# Patient Record
Sex: Female | Born: 1990
Health system: Southern US, Community
[De-identification: ages and names within clinical notes are randomized; demographics above are authoritative.]

## PROBLEM LIST (undated history)

## (undated) DIAGNOSIS — I10 Essential (primary) hypertension: Secondary | ICD-10-CM

## (undated) DIAGNOSIS — J45909 Unspecified asthma, uncomplicated: Secondary | ICD-10-CM

## (undated) DIAGNOSIS — R519 Headache, unspecified: Secondary | ICD-10-CM

## (undated) HISTORY — DX: Headache, unspecified: R51.9

## (undated) HISTORY — DX: Unspecified asthma, uncomplicated: J45.909

---
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
## 2013-10-07 ENCOUNTER — Encounter (HOSPITAL_COMMUNITY): Payer: Self-pay | Admitting: Emergency Medicine

## 2013-10-07 ENCOUNTER — Emergency Department (INDEPENDENT_AMBULATORY_CARE_PROVIDER_SITE_OTHER)
Admission: EM | Admit: 2013-10-07 | Discharge: 2013-10-07 | Disposition: A | Discharge status: Home / Self Care | Payer: Self-pay | Source: Home / Self Care | Attending: Family Medicine | Admitting: Family Medicine

## 2013-10-07 ENCOUNTER — Emergency Department (INDEPENDENT_AMBULATORY_CARE_PROVIDER_SITE_OTHER): Payer: Self-pay

## 2013-10-07 DIAGNOSIS — S93409A Sprain of unspecified ligament of unspecified ankle, initial encounter: Secondary | ICD-10-CM

## 2013-10-07 MED ORDER — DICLOFENAC SODIUM 50 MG PO TBEC: 50.0000 mg | DELAYED_RELEASE_TABLET | Freq: Two times a day (BID) | ORAL | Status: DC | PRN

## 2013-10-07 NOTE — Discharge Instructions (Signed)
Thank you for coming in today. Take diclofenac as needed Use the ankle brace when active  Acute Ankle Sprain with Phase I Rehab An acute ankle sprain is a partial or complete tear in one or more of the ligaments of the ankle due to traumatic injury. The severity of the injury depends on both the the number of ligaments sprained and the grade of sprain. There are 3 grades of sprains.   A grade 1 sprain is a mild sprain. There is a slight pull without obvious tearing. There is no loss of strength, and the muscle and ligament are the correct length.  A grade 2 sprain is a moderate sprain. There is tearing of fibers within the substance of the ligament where it connects two bones or two cartilages. The length of the ligament is increased, and there is usually decreased strength.  A grade 3 sprain is a complete rupture of the ligament and is uncommon. In addition to the grade of sprain, there are three types of ankle sprains.  Lateral ankle sprains: This is a sprain of one or more of the three ligaments on the outer side (lateral) of the ankle. These are the most common sprains. Medial ankle sprains: There is one large triangular ligament of the inner side (medial) of the ankle that is susceptible to injury. Medial ankle sprains are less common. Syndesmosis, "high ankle," sprains: The syndesmosis is the ligament that connects the two bones of the lower leg. Syndesmosis sprains usually only occur with very severe ankle sprains. SYMPTOMS  Pain, tenderness, and swelling in the ankle, starting at the side of injury that may progress to the whole ankle and foot with time.  "Pop" or tearing sensation at the time of injury.  Bruising that may spread to the heel.  Impaired ability to walk soon after injury. CAUSES   Acute ankle sprains are caused by trauma placed on the ankle that temporarily forces or pries the anklebone (talus) out of its normal socket.  Stretching or tearing of the ligaments that  normally hold the joint in place (usually due to a twisting injury). RISK INCREASES WITH:  Previous ankle sprain.  Sports in which the foot may land awkwardly (ie. basketball, volleyball, or soccer) or walking or running on uneven or rough surfaces.  Shoes with inadequate support to prevent sideways motion when stress occurs.  Poor strength and flexibility.  Poor balance skills.  Contact sports. PREVENTION   Warm up and stretch properly before activity.  Maintain physical fitness:  Ankle and leg flexibility, muscle strength, and endurance.  Cardiovascular fitness.  Balance training activities.  Use proper technique and have a coach correct improper technique.  Taping, protective strapping, bracing, or high-top tennis shoes may help prevent injury. Initially, tape is best; however, it loses most of its support function within 10 to 15 minutes.  Wear proper fitted protective shoes (High-top shoes with taping or bracing is more effective than either alone).  Provide the ankle with support during sports and practice activities for 12 months following injury. PROGNOSIS   If treated properly, ankle sprains can be expected to recover completely; however, the length of recovery depends on the degree of injury.  A grade 1 sprain usually heals enough in 5 to 7 days to allow modified activity and requires an average of 6 weeks to heal completely.  A grade 2 sprain requires 6 to 10 weeks to heal completely.  A grade 3 sprain requires 12 to 16 weeks to heal.  A  syndesmosis sprain often takes more than 3 months to heal. RELATED COMPLICATIONS   Frequent recurrence of symptoms may result in a chronic problem. Appropriately addressing the problem the first time decreases the frequency of recurrence and optimizes healing time. Severity of the initial sprain does not predict the likelihood of later instability.  Injury to other structures (bone, cartilage, or tendon).  A chronically  unstable or arthritic ankle joint is a possiblity with repeated sprains. TREATMENT Treatment initially involves the use of ice, medication, and compression bandages to help reduce pain and inflammation. Ankle sprains are usually immobilized in a walking cast or boot to allow for healing. Crutches may be recommended to reduce pressure on the injury. After immobilization, strengthening and stretching exercises may be necessary to regain strength and a full range of motion. Surgery is rarely needed to treat ankle sprains. MEDICATION   Nonsteroidal anti-inflammatory medications, such as aspirin and ibuprofen (do not take for the first 3 days after injury or within 7 days before surgery), or other minor pain relievers, such as acetaminophen, are often recommended. Take these as directed by your caregiver. Contact your caregiver immediately if any bleeding, stomach upset, or signs of an allergic reaction occur from these medications.  Ointments applied to the skin may be helpful.  Pain relievers may be prescribed as necessary by your caregiver. Do not take prescription pain medication for longer than 4 to 7 days. Use only as directed and only as much as you need. HEAT AND COLD  Cold treatment (icing) is used to relieve pain and reduce inflammation for acute and chronic cases. Cold should be applied for 10 to 15 minutes every 2 to 3 hours for inflammation and pain and immediately after any activity that aggravates your symptoms. Use ice packs or an ice massage.  Heat treatment may be used before performing stretching and strengthening activities prescribed by your caregiver. Use a heat pack or a warm soak. SEEK IMMEDIATE MEDICAL CARE IF:   Pain, swelling, or bruising worsens despite treatment.  You experience pain, numbness, discoloration, or coldness in the foot or toes.  New, unexplained symptoms develop (drugs used in treatment may produce side effects.) EXERCISES  PHASE I EXERCISES RANGE OF  MOTION (ROM) AND STRETCHING EXERCISES - Ankle Sprain, Acute Phase I, Weeks 1 to 2 These exercises may help you when beginning to restore flexibility in your ankle. You will likely work on these exercises for the 1 to 2 weeks after your injury. Once your physician, physical therapist, or athletic trainer sees adequate progress, he or she will advance your exercises. While completing these exercises, remember:   Restoring tissue flexibility helps normal motion to return to the joints. This allows healthier, less painful movement and activity.  An effective stretch should be held for at least 30 seconds.  A stretch should never be painful. You should only feel a gentle lengthening or release in the stretched tissue. RANGE OF MOTION - Dorsi/Plantar Flexion  While sitting with your right / left knee straight, draw the top of your foot upwards by flexing your ankle. Then reverse the motion, pointing your toes downward.  Hold each position for __________ seconds.  After completing your first set of exercises, repeat this exercise with your knee bent. Repeat __________ times. Complete this exercise __________ times per day.  RANGE OF MOTION - Ankle Alphabet  Imagine your right / left big toe is a pen.  Keeping your hip and knee still, write out the entire alphabet with your "pen."  Make the letters as large as you can without increasing any discomfort. Repeat __________ times. Complete this exercise __________ times per day.  STRENGTHENING EXERCISES - Ankle Sprain, Acute -Phase I, Weeks 1 to 2 These exercises may help you when beginning to restore strength in your ankle. You will likely work on these exercises for 1 to 2 weeks after your injury. Once your physician, physical therapist, or athletic trainer sees adequate progress, he or she will advance your exercises. While completing these exercises, remember:   Muscles can gain both the endurance and the strength needed for everyday activities  through controlled exercises.  Complete these exercises as instructed by your physician, physical therapist, or athletic trainer. Progress the resistance and repetitions only as guided.  You may experience muscle soreness or fatigue, but the pain or discomfort you are trying to eliminate should never worsen during these exercises. If this pain does worsen, stop and make certain you are following the directions exactly. If the pain is still present after adjustments, discontinue the exercise until you can discuss the trouble with your clinician. STRENGTH - Dorsiflexors  Secure a rubber exercise band/tubing to a fixed object (ie. table, pole) and loop the other end around your right / left foot.  Sit on the floor facing the fixed object. The band/tubing should be slightly tense when your foot is relaxed.  Slowly draw your foot back toward you using your ankle and toes.  Hold this position for __________ seconds. Slowly release the tension in the band and return your foot to the starting position. Repeat __________ times. Complete this exercise __________ times per day.  STRENGTH - Plantar-flexors   Sit with your right / left leg extended. Holding onto both ends of a rubber exercise band/tubing, loop it around the ball of your foot. Keep a slight tension in the band.  Slowly push your toes away from you, pointing them downward.  Hold this position for __________ seconds. Return slowly, controlling the tension in the band/tubing. Repeat __________ times. Complete this exercise __________ times per day.  STRENGTH - Ankle Eversion  Secure one end of a rubber exercise band/tubing to a fixed object (table, pole). Loop the other end around your foot just before your toes.  Place your fists between your knees. This will focus your strengthening at your ankle.  Drawing the band/tubing across your opposite foot, slowly, pull your little toe out and up. Make sure the band/tubing is positioned to  resist the entire motion.  Hold this position for __________ seconds. Have your muscles resist the band/tubing as it slowly pulls your foot back to the starting position.  Repeat __________ times. Complete this exercise __________ times per day.  STRENGTH - Ankle Inversion  Secure one end of a rubber exercise band/tubing to a fixed object (table, pole). Loop the other end around your foot just before your toes.  Place your fists between your knees. This will focus your strengthening at your ankle.  Slowly, pull your big toe up and in, making sure the band/tubing is positioned to resist the entire motion.  Hold this position for __________ seconds.  Have your muscles resist the band/tubing as it slowly pulls your foot back to the starting position. Repeat __________ times. Complete this exercises __________ times per day.  STRENGTH - Towel Curls  Sit in a chair positioned on a non-carpeted surface.  Place your right / left foot on a towel, keeping your heel on the floor.  Pull the towel toward your heel  by only curling your toes. Keep your heel on the floor.  If instructed by your physician, physical therapist, or athletic trainer, add weight to the end of the towel. Repeat __________ times. Complete this exercise __________ times per day. Document Released: 02/04/2005 Document Revised: 09/28/2011 Document Reviewed: 10/18/2008 [facility] Patient Information 2014 [place], [place].

## 2013-10-07 NOTE — ED Provider Notes (Signed)
Sally Taylor is a 23 y.o. female who presents to Urgent Care today for left ankle pain and swelling. Patient has had ankle inversion injuries occurring several times in the past one month. 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 She notes pain and swelling in the lateral aspect of her ankle. She denies any radiating pain weakness or numbness. She has not tried any home exercises per physical therapy. She's tried some over-the-counter medications which have been marginally helpful. She feels well otherwise.   History reviewed. No pertinent past medical history. History  Substance Use Topics  . Smoking status: Current Every Day Smoker  . Smokeless tobacco: Not on file  . Alcohol Use: Yes   ROS as above Medications: No current facility-administered medications for this encounter.   Current Outpatient Prescriptions  Medication Sig Dispense Refill  . diclofenac (VOLTAREN) 50 MG EC tablet Take 1 tablet (50 mg total) by mouth 2 (two) times daily as needed.  60 tablet  0    Exam:  BP 127/79  Pulse 84  Temp(Src) 98.5 F (36.9 C) (Oral)  Resp 18  SpO2 100%  LMP 10/07/2013 Gen: Well NAD Left ankle:  Mildly swollen over the lateral malleolus and ATFL. Tender palpation ATFL insertion. Stable ligamentous exam. Negative talar tilt and anterior drawer. Capillary refill sensation pulses are intact.   No results found for this or any previous visit (from the past 24 hour(s)). Dg Ankle Complete Left  10/07/2013   CLINICAL DATA:  Left ankle injury, pain/soft tissue swelling laterally  EXAM: LEFT ANKLE COMPLETE - 3+ VIEW  COMPARISON:  None.  FINDINGS: No fracture or dislocation is seen.  The ankle mortise is intact.  The base of the fifth metatarsal is unremarkable.  Visualized soft tissues are grossly unremarkable.  IMPRESSION: No fracture or dislocation is seen.   Electronically Signed   By: Julian Hy M.D.   On: 10/07/2013 16:25    Assessment and Plan: 23 y.o.  female with ankle sprain. Plan for ASO type brace, home exercise program, and NSAIDs. Emphasize exercise program.  Discussed warning signs or symptoms. Please see discharge instructions. Patient expresses understanding.    Gregor Hams, MD 10/07/13 [phone]

## 2013-10-07 NOTE — ED Notes (Signed)
PT  STATES    SHE  INJURED  HER  L  ANKLE       3  WEEKS        AGO  AND  REINJURED  IT      1  WEEK  AGO   AND SUBSEQUENTLTY  FELL  AGAIN LAST  PM  AND  AGAIN  REINJURED  THE  ANKLE  SHE  REPORTS  SWELLING AND  PAIN ON  WEIGHT  BEARING

## 2017-03-31 ENCOUNTER — Emergency Department (HOSPITAL_COMMUNITY): Admission: EM | Admit: 2017-03-31 | Discharge: 2017-03-31 | Discharge status: Against Medical Advice | Payer: Self-pay

## 2017-04-01 ENCOUNTER — Emergency Department (HOSPITAL_COMMUNITY): Payer: Self-pay | Admitting: Anesthesiology

## 2017-04-01 ENCOUNTER — Emergency Department (HOSPITAL_COMMUNITY): Payer: Self-pay

## 2017-04-01 ENCOUNTER — Encounter (HOSPITAL_COMMUNITY): Payer: Self-pay | Admitting: Emergency Medicine

## 2017-04-01 ENCOUNTER — Observation Stay (HOSPITAL_COMMUNITY)
Admission: EM | Admit: 2017-04-01 | Discharge: 2017-04-02 | Disposition: A | Discharge status: Home / Self Care | Payer: Self-pay | Attending: Surgery | Admitting: Surgery

## 2017-04-01 ENCOUNTER — Encounter (HOSPITAL_COMMUNITY)
Admission: EM | Disposition: A | Discharge status: Home / Self Care | Payer: Self-pay | Source: Home / Self Care | Attending: Emergency Medicine

## 2017-04-01 DIAGNOSIS — F17210 Nicotine dependence, cigarettes, uncomplicated: Secondary | ICD-10-CM | POA: Insufficient documentation

## 2017-04-01 DIAGNOSIS — Z79899 Other long term (current) drug therapy: Secondary | ICD-10-CM | POA: Insufficient documentation

## 2017-04-01 DIAGNOSIS — K353 Acute appendicitis with localized peritonitis, without perforation or gangrene: Secondary | ICD-10-CM

## 2017-04-01 DIAGNOSIS — K381 Appendicular concretions: Secondary | ICD-10-CM | POA: Insufficient documentation

## 2017-04-01 DIAGNOSIS — K3580 Unspecified acute appendicitis: Principal | ICD-10-CM | POA: Insufficient documentation

## 2017-04-01 DIAGNOSIS — K37 Unspecified appendicitis: Secondary | ICD-10-CM | POA: Diagnosis present

## 2017-04-01 DIAGNOSIS — I10 Essential (primary) hypertension: Secondary | ICD-10-CM | POA: Insufficient documentation

## 2017-04-01 HISTORY — DX: Essential (primary) hypertension: I10

## 2017-04-01 HISTORY — PX: LAPAROSCOPIC APPENDECTOMY: SHX408

## 2017-04-01 LAB — CREATININE, SERUM
Creatinine, Ser: 0.82 mg/dL (ref 0.44–1.00)
GFR calc non Af Amer: >60 mL/min (ref >60)

## 2017-04-01 LAB — CBC
HEMATOCRIT: 40 % (ref 36.0–46.0)
HEMATOCRIT: 38.6 % (ref 36.0–46.0)
Hemoglobin: 13.5 g/dL (ref 12.0–15.0)
Hemoglobin: 13 g/dL (ref 12.0–15.0)
MCH: 26.2 pg (ref 26.0–34.0)
MCH: 26.4 pg (ref 26.0–34.0)
MCHC: 33.8 g/dL (ref 30.0–36.0)
MCHC: 33.7 g/dL (ref 30.0–36.0)
MCV: 77.7 fL — ABNORMAL LOW (ref 78.0–100.0)
MCV: 78.3 fL (ref 78.0–100.0)
Platelets: 454 10*3/uL — ABNORMAL HIGH (ref 150–400)
Platelets: 400 10*3/uL (ref 150–400)
RBC: 5.15 MIL/uL — ABNORMAL HIGH (ref 3.87–5.11)
RBC: 4.93 MIL/uL (ref 3.87–5.11)
RDW: 14.2 % (ref 11.5–15.5)
RDW: 14.5 % (ref 11.5–15.5)
WBC: 18.9 10*3/uL — ABNORMAL HIGH (ref 4.0–10.5)
WBC: 19.5 10*3/uL — AB (ref 4.0–10.5)

## 2017-04-01 LAB — URINALYSIS, ROUTINE W REFLEX MICROSCOPIC
BILIRUBIN URINE: NEGATIVE
GLUCOSE, UA: NEGATIVE mg/dL
KETONES UR: 80 mg/dL — AB
Leukocytes, UA: NEGATIVE
NITRITE: NEGATIVE
Protein, ur: NEGATIVE mg/dL
Specific Gravity, Urine: 1.021 (ref 1.005–1.030)
pH: 5 (ref 5.0–8.0)

## 2017-04-01 LAB — COMPREHENSIVE METABOLIC PANEL
ALT: 20 U/L (ref 14–54)
AST: 20 U/L (ref 15–41)
Albumin: 4.6 g/dL (ref 3.5–5.0)
Alkaline Phosphatase: 67 U/L (ref 38–126)
Anion gap: 11 (ref 5–15)
BILIRUBIN TOTAL: 0.9 mg/dL (ref 0.3–1.2)
BUN: 8 mg/dL (ref 6–20)
CHLORIDE: 100 mmol/L — AB (ref 101–111)
CO2: 26 mmol/L (ref 22–32)
Calcium: 9.5 mg/dL (ref 8.9–10.3)
Creatinine, Ser: 0.8 mg/dL (ref 0.44–1.00)
GFR calc Af Amer: >60 mL/min (ref >60)
Glucose, Bld: 106 mg/dL — ABNORMAL HIGH (ref 65–99)
Potassium: 3.6 mmol/L (ref 3.5–5.1)
Sodium: 137 mmol/L (ref 135–145)
TOTAL PROTEIN: 8.4 g/dL — AB (ref 6.5–8.1)

## 2017-04-01 LAB — I-STAT BETA HCG BLOOD, ED (MC, WL, AP ONLY): I-stat hCG, quantitative: <5 m[IU]/mL (ref <5)

## 2017-04-01 LAB — WET PREP, GENITAL
Sperm: NONE SEEN
Trich, Wet Prep: NONE SEEN
Yeast Wet Prep HPF POC: NONE SEEN

## 2017-04-01 LAB — LIPASE, BLOOD: LIPASE: 18 U/L (ref 11–51)

## 2017-04-01 LAB — MRSA PCR SCREENING: MRSA by PCR: NEGATIVE

## 2017-04-01 SURGERY — APPENDECTOMY, LAPAROSCOPIC
Anesthesia: General | Site: Abdomen

## 2017-04-01 MED ORDER — METRONIDAZOLE IN NACL 5-0.79 MG/ML-% IV SOLN
500.0000 mg | Freq: Three times a day (TID) | INTRAVENOUS | Status: DC
  Administered 2017-04-01 – 2017-04-02 (×3): 500 mg via INTRAVENOUS
  Filled 2017-04-01 (×4): qty 100

## 2017-04-01 MED ORDER — MIDAZOLAM HCL 2 MG/2ML IJ SOLN
INTRAMUSCULAR | Status: AC
  Filled 2017-04-01: qty 2

## 2017-04-01 MED ORDER — LACTATED RINGERS IV SOLN
INTRAVENOUS | Status: DC | PRN
Start: 2017-04-01 — End: 2017-04-01
  Administered 2017-04-01: 14:00:00 via INTRAVENOUS

## 2017-04-01 MED ORDER — SODIUM CHLORIDE 0.9 % IV SOLN
INTRAVENOUS | Status: DC | PRN
  Administered 2017-04-01: 13:00:00 via INTRAVENOUS

## 2017-04-01 MED ORDER — SODIUM CHLORIDE 0.9 % IR SOLN
Status: DC | PRN
  Administered 2017-04-01: 1000 mL

## 2017-04-01 MED ORDER — METRONIDAZOLE IN NACL 5-0.79 MG/ML-% IV SOLN
500.0000 mg | Freq: Once | INTRAVENOUS | Status: DC
  Filled 2017-04-01: qty 100

## 2017-04-01 MED ORDER — HYDROMORPHONE HCL-NACL 0.5-0.9 MG/ML-% IV SOSY: 0.2500 mg | PREFILLED_SYRINGE | INTRAVENOUS | Status: DC | PRN

## 2017-04-01 MED ORDER — DIPHENHYDRAMINE HCL 50 MG/ML IJ SOLN: 25.0000 mg | Freq: Four times a day (QID) | INTRAMUSCULAR | Status: DC | PRN

## 2017-04-01 MED ORDER — LACTATED RINGERS IV SOLN
INTRAVENOUS | Status: AC | PRN
  Administered 2017-04-01: 1000 mL

## 2017-04-01 MED ORDER — MORPHINE SULFATE (PF) 2 MG/ML IV SOLN
1.0000 mg | INTRAVENOUS | Status: DC | PRN
  Administered 2017-04-01: 1 mg via INTRAVENOUS
  Filled 2017-04-01: qty 1

## 2017-04-01 MED ORDER — FENTANYL CITRATE (PF) 250 MCG/5ML IJ SOLN
INTRAMUSCULAR | Status: AC
  Filled 2017-04-01: qty 5

## 2017-04-01 MED ORDER — HYDROCODONE-ACETAMINOPHEN 5-325 MG PO TABS
1.0000 | ORAL_TABLET | ORAL | Status: DC | PRN
  Administered 2017-04-02 (×2): 2 via ORAL
  Filled 2017-04-01 (×2): qty 2

## 2017-04-01 MED ORDER — PROMETHAZINE HCL 25 MG/ML IJ SOLN: 6.2500 mg | INTRAMUSCULAR | Status: DC | PRN

## 2017-04-01 MED ORDER — SUGAMMADEX SODIUM 200 MG/2ML IV SOLN
INTRAVENOUS | Status: DC | PRN
  Administered 2017-04-01: 200 mg via INTRAVENOUS

## 2017-04-01 MED ORDER — BUPIVACAINE LIPOSOME 1.3 % IJ SUSP
20.0000 mL | Freq: Once | INTRAMUSCULAR | Status: DC
  Filled 2017-04-01: qty 20

## 2017-04-01 MED ORDER — AZITHROMYCIN 250 MG PO TABS
1000.0000 mg | ORAL_TABLET | Freq: Once | ORAL | Status: AC
  Administered 2017-04-01: 1000 mg via ORAL
  Filled 2017-04-01: qty 4

## 2017-04-01 MED ORDER — PROPOFOL 10 MG/ML IV BOLUS
INTRAVENOUS | Status: DC | PRN
  Administered 2017-04-01: 200 mg via INTRAVENOUS

## 2017-04-01 MED ORDER — BUPIVACAINE LIPOSOME 1.3 % IJ SUSP
INTRAMUSCULAR | Status: DC | PRN
  Administered 2017-04-01: 20 mL

## 2017-04-01 MED ORDER — PHENYLEPHRINE HCL 10 MG/ML IJ SOLN
INTRAMUSCULAR | Status: DC | PRN
  Administered 2017-04-01: 80 ug via INTRAVENOUS
  Administered 2017-04-01: 40 ug via INTRAVENOUS

## 2017-04-01 MED ORDER — LIDOCAINE 2% (20 MG/ML) 5 ML SYRINGE
INTRAMUSCULAR | Status: AC
  Filled 2017-04-01: qty 5

## 2017-04-01 MED ORDER — KETOROLAC TROMETHAMINE 30 MG/ML IJ SOLN
15.0000 mg | Freq: Once | INTRAMUSCULAR | Status: AC
  Administered 2017-04-01: 15 mg via INTRAVENOUS
  Filled 2017-04-01: qty 1

## 2017-04-01 MED ORDER — SUGAMMADEX SODIUM 200 MG/2ML IV SOLN
INTRAVENOUS | Status: AC
  Filled 2017-04-01: qty 2

## 2017-04-01 MED ORDER — ROCURONIUM BROMIDE 50 MG/5ML IV SOSY
PREFILLED_SYRINGE | INTRAVENOUS | Status: AC
  Filled 2017-04-01: qty 5

## 2017-04-01 MED ORDER — HEPARIN SODIUM (PORCINE) 5000 UNIT/ML IJ SOLN
5000.0000 [IU] | Freq: Three times a day (TID) | INTRAMUSCULAR | Status: DC
  Administered 2017-04-02 (×2): 5000 [IU] via SUBCUTANEOUS
  Filled 2017-04-01 (×2): qty 1

## 2017-04-01 MED ORDER — PROPOFOL 10 MG/ML IV BOLUS
INTRAVENOUS | Status: AC
  Filled 2017-04-01: qty 20

## 2017-04-01 MED ORDER — DEXTROSE 5 % IV SOLN
2.0000 g | INTRAVENOUS | Status: DC
  Administered 2017-04-01: 2 g via INTRAVENOUS
  Filled 2017-04-01 (×2): qty 2

## 2017-04-01 MED ORDER — ONDANSETRON 4 MG PO TBDP: 4.0000 mg | ORAL_TABLET | Freq: Four times a day (QID) | ORAL | Status: DC | PRN

## 2017-04-01 MED ORDER — DEXAMETHASONE SODIUM PHOSPHATE 10 MG/ML IJ SOLN
INTRAMUSCULAR | Status: AC
  Filled 2017-04-01: qty 1

## 2017-04-01 MED ORDER — ONDANSETRON HCL 4 MG/2ML IJ SOLN
INTRAMUSCULAR | Status: DC | PRN
  Administered 2017-04-01: 4 mg via INTRAVENOUS

## 2017-04-01 MED ORDER — PIPERACILLIN-TAZOBACTAM 3.375 G IVPB 30 MIN
3.3750 g | Freq: Once | INTRAVENOUS | Status: AC
  Administered 2017-04-01: 3.375 g via INTRAVENOUS
  Filled 2017-04-01: qty 50

## 2017-04-01 MED ORDER — ONDANSETRON HCL 4 MG/2ML IJ SOLN
INTRAMUSCULAR | Status: AC
  Filled 2017-04-01: qty 2

## 2017-04-01 MED ORDER — FENTANYL CITRATE (PF) 100 MCG/2ML IJ SOLN
INTRAMUSCULAR | Status: DC | PRN
  Administered 2017-04-01 (×3): 50 ug via INTRAVENOUS

## 2017-04-01 MED ORDER — ONDANSETRON HCL 4 MG/2ML IJ SOLN
4.0000 mg | Freq: Once | INTRAMUSCULAR | Status: AC
  Administered 2017-04-01: 4 mg via INTRAVENOUS
  Filled 2017-04-01: qty 2

## 2017-04-01 MED ORDER — ROCURONIUM BROMIDE 100 MG/10ML IV SOLN
INTRAVENOUS | Status: DC | PRN
  Administered 2017-04-01: 30 mg via INTRAVENOUS
  Administered 2017-04-01: 10 mg via INTRAVENOUS

## 2017-04-01 MED ORDER — SODIUM CHLORIDE 0.9 % IV BOLUS (SEPSIS)
1000.0000 mL | Freq: Once | INTRAVENOUS | Status: AC
  Administered 2017-04-01: 1000 mL via INTRAVENOUS

## 2017-04-01 MED ORDER — PIPERACILLIN-TAZOBACTAM 3.375 G IVPB
3.3750 g | Freq: Three times a day (TID) | INTRAVENOUS | Status: DC
  Filled 2017-04-01: qty 50

## 2017-04-01 MED ORDER — KCL IN DEXTROSE-NACL 20-5-0.45 MEQ/L-%-% IV SOLN
INTRAVENOUS | Status: DC
  Administered 2017-04-01 – 2017-04-02 (×2): via INTRAVENOUS
  Filled 2017-04-01 (×3): qty 1000

## 2017-04-01 MED ORDER — IOPAMIDOL (ISOVUE-300) INJECTION 61%
INTRAVENOUS | Status: AC
  Filled 2017-04-01: qty 100

## 2017-04-01 MED ORDER — SUCCINYLCHOLINE CHLORIDE 200 MG/10ML IV SOSY
PREFILLED_SYRINGE | INTRAVENOUS | Status: AC
  Filled 2017-04-01: qty 10

## 2017-04-01 MED ORDER — SUCCINYLCHOLINE CHLORIDE 20 MG/ML IJ SOLN
INTRAMUSCULAR | Status: DC | PRN
  Administered 2017-04-01: 120 mg via INTRAVENOUS

## 2017-04-01 MED ORDER — LIDOCAINE HCL (CARDIAC) 20 MG/ML IV SOLN
INTRAVENOUS | Status: DC | PRN
  Administered 2017-04-01: 80 mg via INTRAVENOUS

## 2017-04-01 MED ORDER — DEXTROSE 5 % IV SOLN
1.0000 g | Freq: Once | INTRAVENOUS | Status: AC
  Administered 2017-04-01: 1 g via INTRAVENOUS
  Filled 2017-04-01: qty 10

## 2017-04-01 MED ORDER — ONDANSETRON HCL 4 MG/2ML IJ SOLN: 4.0000 mg | Freq: Four times a day (QID) | INTRAMUSCULAR | Status: DC | PRN

## 2017-04-01 MED ORDER — MORPHINE SULFATE (PF) 4 MG/ML IV SOLN
4.0000 mg | Freq: Once | INTRAVENOUS | Status: AC
  Administered 2017-04-01: 4 mg via INTRAVENOUS
  Filled 2017-04-01: qty 1

## 2017-04-01 MED ORDER — DEXAMETHASONE SODIUM PHOSPHATE 10 MG/ML IJ SOLN
INTRAMUSCULAR | Status: DC | PRN
  Administered 2017-04-01: 10 mg via INTRAVENOUS

## 2017-04-01 MED ORDER — KETOROLAC TROMETHAMINE 30 MG/ML IJ SOLN
30.0000 mg | Freq: Once | INTRAMUSCULAR | Status: DC | PRN
Start: 2017-04-01 — End: 2017-04-01

## 2017-04-01 MED ORDER — IOPAMIDOL (ISOVUE-300) INJECTION 61%
100.0000 mL | Freq: Once | INTRAVENOUS | Status: AC | PRN
  Administered 2017-04-01: 100 mL via INTRAVENOUS

## 2017-04-01 MED ORDER — MIDAZOLAM HCL 5 MG/5ML IJ SOLN
INTRAMUSCULAR | Status: DC | PRN
  Administered 2017-04-01 (×2): 1 mg via INTRAVENOUS

## 2017-04-01 MED ORDER — DIPHENHYDRAMINE HCL 25 MG PO CAPS: 25.0000 mg | ORAL_CAPSULE | Freq: Four times a day (QID) | ORAL | Status: DC | PRN

## 2017-04-01 SURGICAL SUPPLY — 43 items
ADH SKN CLS APL DERMABOND .7 (GAUZE/BANDAGES/DRESSINGS) ×1
APPLIER CLIP ROT 10 11.4 M/L (STAPLE)
APR CLP MED LRG 11.4X10 (STAPLE)
BAG SPEC RTRVL 10 TROC 200 (ENDOMECHANICALS) ×1
BAG SPEC RTRVL LRG 6X4 10 (ENDOMECHANICALS)
CABLE HIGH FREQUENCY MONO STRZ (ELECTRODE) IMPLANT
CHLORAPREP W/TINT 26ML (MISCELLANEOUS) ×1 IMPLANT
CLIP APPLIE ROT 10 11.4 M/L (STAPLE) IMPLANT
COVER SURGICAL LIGHT HANDLE (MISCELLANEOUS) ×2 IMPLANT
CUTTER FLEX LINEAR 45M (STAPLE) ×2 IMPLANT
DECANTER SPIKE VIAL GLASS SM (MISCELLANEOUS) IMPLANT
DERMABOND ADVANCED (GAUZE/BANDAGES/DRESSINGS) ×1
DERMABOND ADVANCED .7 DNX12 (GAUZE/BANDAGES/DRESSINGS) ×1 IMPLANT
DRAPE LAPAROSCOPIC ABDOMINAL (DRAPES) ×1 IMPLANT
ELECT REM PT RETURN 15FT ADLT (MISCELLANEOUS) ×2 IMPLANT
ENDOLOOP SUT PDS II  0 18 (SUTURE)
ENDOLOOP SUT PDS II 0 18 (SUTURE) IMPLANT
GLOVE BIOGEL M 8.0 STRL (GLOVE) ×2 IMPLANT
GOWN STRL REUS W/TWL XL LVL3 (GOWN DISPOSABLE) ×2 IMPLANT
KIT BASIN OR (CUSTOM PROCEDURE TRAY) ×2 IMPLANT
PAD POSITIONING PINK XL (MISCELLANEOUS) ×2 IMPLANT
POUCH RETRIEVAL ECOSAC 10 (ENDOMECHANICALS) IMPLANT
POUCH RETRIEVAL ECOSAC 10MM (ENDOMECHANICALS) ×1
POUCH SPECIMEN RETRIEVAL 10MM (ENDOMECHANICALS) ×1 IMPLANT
RELOAD 45 VASCULAR/THIN (ENDOMECHANICALS) ×2 IMPLANT
RELOAD STAPLE 45 2.5 WHT GRN (ENDOMECHANICALS) IMPLANT
RELOAD STAPLE 45 3.5 BLU ETS (ENDOMECHANICALS) IMPLANT
RELOAD STAPLE TA45 3.5 REG BLU (ENDOMECHANICALS) IMPLANT
SCISSORS LAP 5X45 EPIX DISP (ENDOMECHANICALS) ×2 IMPLANT
SET IRRIG TUBING LAPAROSCOPIC (IRRIGATION / IRRIGATOR) ×2 IMPLANT
SHEARS HARMONIC ACE PLUS 45CM (MISCELLANEOUS) ×2 IMPLANT
SLEEVE XCEL OPT CAN 5 100 (ENDOMECHANICALS) ×2 IMPLANT
STAPLER VISISTAT 35W (STAPLE) IMPLANT
SUT MNCRL AB 4-0 PS2 18 (SUTURE) ×3 IMPLANT
SUT VICRYL 0 UR6 27IN ABS (SUTURE) ×1 IMPLANT
TOWEL OR 17X26 10 PK STRL BLUE (TOWEL DISPOSABLE) ×2 IMPLANT
TRAY FOLEY W/METER SILVER 14FR (SET/KITS/TRAYS/PACK) ×1 IMPLANT
TRAY FOLEY W/METER SILVER 16FR (SET/KITS/TRAYS/PACK) IMPLANT
TRAY LAPAROSCOPIC (CUSTOM PROCEDURE TRAY) ×2 IMPLANT
TROCAR BLADELESS OPT 5 100 (ENDOMECHANICALS) ×2 IMPLANT
TROCAR XCEL BLUNT TIP 100MML (ENDOMECHANICALS) ×2 IMPLANT
TROCAR XCEL NON-BLD 11X100MML (ENDOMECHANICALS) IMPLANT
TUBING INSUF HEATED (TUBING) ×2 IMPLANT

## 2017-04-01 NOTE — Anesthesia Postprocedure Evaluation (Signed)
Anesthesia Post Note  Patient: Sally Taylor  Procedure(s) Performed: Procedure(s) (LRB): APPENDECTOMY LAPAROSCOPIC (N/A)     Patient location during evaluation: PACU Anesthesia Type: General Level of consciousness: awake and alert Pain management: pain level controlled Vital Signs Assessment: post-procedure vital signs reviewed and stable Respiratory status: spontaneous breathing, nonlabored ventilation, respiratory function stable and patient connected to nasal cannula oxygen Cardiovascular status: blood pressure returned to baseline and stable Postop Assessment: no apparent nausea or vomiting Anesthetic complications: no    Last Vitals:  Vitals:   04/01/17 1226 04/01/17 1455  BP: 114/77 133/79  Pulse: 83   Resp: 16 12  Temp: 37.5 C   SpO2: 100%     Last Pain:  Vitals:   04/01/17 1252  TempSrc:   PainSc: 7                  Montez Hageman

## 2017-04-01 NOTE — ED Provider Notes (Signed)
[facility] DEPT Provider Note   CSN: [id number] Arrival date & time: 04/01/17  0417     History   Chief Complaint Chief Complaint  Patient presents with  . Abdominal Pain  . Emesis    HPI Sally Taylor is a 26 y.o. female.  26 yo F with a chief complaint of right lower abdominal pain. This started yesterday and is gotten progressively worse. Had some nausea and vomiting with this. Denies fevers. Worse with movement going over bumps in the car. Denies dysuria or increased frequency or hesitancy. No prior abdominal surgery. Denies anorexia. Denies vaginal bleeding or discharge. Pain was described as crampy and felt like her menstrual cramps. Has been coming and going.   The history is provided by the patient and a relative.  Abdominal Pain   This is a new problem. The current episode started 12 to 24 hours ago. The problem occurs constantly. The problem has not changed since onset.The pain is located in the suprapubic region. The quality of the pain is sharp and shooting. The pain is at a severity of 9/10. The pain is severe. Associated symptoms include nausea and vomiting. Pertinent negatives include fever, diarrhea, constipation, dysuria, headaches, arthralgias and myalgias. The symptoms are aggravated by certain positions and activity. Nothing relieves the symptoms.  Emesis   Associated symptoms include abdominal pain. Pertinent negatives include no arthralgias, no chills, no diarrhea, no fever, no headaches and no myalgias.    Past Medical History:  Diagnosis Date  . Hypertension     Patient Active Problem List   Diagnosis Date Noted  . Appendicitis 04/01/2017    History reviewed. No pertinent surgical history.  OB History    No data available       Home Medications    Prior to Admission medications   Medication Sig Start Date End Date Taking? Authorizing Provider  ibuprofen (ADVIL,MOTRIN) 200 MG tablet Take 800 mg by mouth every 6 (six) hours  as needed for fever, headache, mild pain, moderate pain or cramping.   Yes [provider]    Family History History reviewed. No pertinent family history.  Social History Social History  Substance Use Topics  . Smoking status: Current Every Day Smoker  . Smokeless tobacco: Never Used  . Alcohol use Yes     Comment: occ     Allergies   Patient has no known allergies.   Review of Systems Review of Systems  Constitutional: Negative for chills and fever.  HENT: Negative for congestion and rhinorrhea.   Eyes: Negative for redness and visual disturbance.  Respiratory: Negative for shortness of breath and wheezing.   Cardiovascular: Negative for chest pain and palpitations.  Gastrointestinal: Positive for abdominal pain, nausea and vomiting. Negative for constipation and diarrhea.  Genitourinary: Negative for dysuria and urgency.  Musculoskeletal: Negative for arthralgias and myalgias.  Skin: Negative for pallor and wound.  Neurological: Negative for dizziness and headaches.     Physical Exam Updated Vital Signs BP (!) 143/73 (BP Location: Right Arm)   Pulse 86   Temp 98.2 F (36.8 C)   Resp 15   Ht 5\' 2"  (1.575 m)   Wt 95.7 kg (211 lb)   LMP 03/14/2017 (Approximate)   SpO2 100%   BMI 38.59 kg/m   Physical Exam  Constitutional: She is oriented to person, place, and time. She appears well-developed and well-nourished. No distress.  HENT:  Head: Normocephalic and atraumatic.  Eyes: Pupils are equal, round, and reactive to light. EOM are  normal.  Neck: Normal range of motion. Neck supple.  Cardiovascular: Normal rate and regular rhythm.  Exam reveals no gallop and no friction rub.   No murmur heard. Pulmonary/Chest: Effort normal. She has no wheezes. She has no rales.  Abdominal: Soft. She exhibits no distension and no mass. There is tenderness (diffuse along the rlq and suprapubic recgion). There is no guarding.  Genitourinary: Uterus is tender. Cervix  exhibits motion tenderness. Cervix exhibits no discharge and no friability. Right adnexum displays no mass, no tenderness and no fullness. Left adnexum displays no mass, no tenderness and no fullness.  Musculoskeletal: She exhibits no edema or tenderness.  Neurological: She is alert and oriented to person, place, and time.  Skin: Skin is warm and dry. She is not diaphoretic.  Psychiatric: She has a normal mood and affect. Her behavior is normal.  Nursing note and vitals reviewed.    ED Treatments / Results  Labs (all labs ordered are listed, but only abnormal results are displayed) Labs Reviewed  WET PREP, GENITAL - Abnormal; Notable for the following:       Result Value   Clue Cells Wet Prep HPF POC PRESENT (*)    WBC, Wet Prep HPF POC FEW (*)    All other components within normal limits  COMPREHENSIVE METABOLIC PANEL - Abnormal; Notable for the following:    Chloride 100 (*)    Glucose, Bld 106 (*)    Total Protein 8.4 (*)    All other components within normal limits  CBC - Abnormal; Notable for the following:    WBC 18.9 (*)    RBC 5.15 (*)    MCV 77.7 (*)    Platelets 454 (*)    All other components within normal limits  URINALYSIS, ROUTINE W REFLEX MICROSCOPIC - Abnormal; Notable for the following:    APPearance HAZY (*)    Hgb urine dipstick MODERATE (*)    Ketones, ur 80 (*)    Bacteria, UA RARE (*)    Squamous Epithelial / LPF 6-30 (*)    All other components within normal limits  LIPASE, BLOOD  RPR  HIV ANTIBODY (ROUTINE TESTING)  I-STAT BETA HCG BLOOD, ED ([facility], [facility], [facility] ONLY)  GC/CHLAMYDIA PROBE AMP (Hydro) NOT AT [facility]  SURGICAL PATHOLOGY    EKG  EKG Interpretation None       Radiology Ct Abdomen Pelvis W Contrast  Result Date: 04/01/2017 CLINICAL DATA:  Right lower quadrant pain, vomiting. EXAM: CT ABDOMEN AND PELVIS WITH CONTRAST TECHNIQUE: Multidetector CT imaging of the abdomen and pelvis was performed using the standard protocol following bolus  administration of intravenous contrast. CONTRAST:  15mL ISOVUE-300 IOPAMIDOL (ISOVUE-300) INJECTION 61% COMPARISON:  None. FINDINGS: Lower chest: Lung bases are clear. No effusions. Heart is normal size. Hepatobiliary: No focal hepatic abnormality. Gallbladder unremarkable. Pancreas: No focal abnormality or ductal dilatation. Spleen: No focal abnormality.  Normal size. Adrenals/Urinary Tract: Cyst in the midpole of the left kidney. No hydronephrosis. Adrenal glands and urinary bladder unremarkable. Stomach/Bowel: 10 mm appendicolith at the base of the appendix with dilated appendix to 13 mm and surrounding inflammatory change. Findings compatible with acute appendicitis. Stomach, large and small bowel grossly unremarkable. Vascular/Lymphatic: No evidence of aneurysm or adenopathy. Reproductive: Uterus and adnexa unremarkable.  No mass. Other: A small amount of free fluid in the pelvis.  No free air. Musculoskeletal: No acute bony abnormality. IMPRESSION: Changes of acute appendicitis. 10 mm appendicolith at the base of the appendix. Electronically Signed   By:  Rolm Baptise M.D.   On: 04/01/2017 10:26    Procedures Procedures (including critical care time)  Medications Ordered in ED Medications  iopamidol (ISOVUE-300) 61 % injection (not administered)  metroNIDAZOLE (FLAGYL) IVPB 500 mg ( Intravenous MAR Hold 04/01/17 1258)  piperacillin-tazobactam (ZOSYN) IVPB 3.375 g ( Intravenous Automatically Held 04/09/17 1800)  bupivacaine liposome (EXPAREL) 1.3 % injection 266 mg (not administered)  HYDROmorphone (DILAUDID) injection 0.25-0.5 mg (not administered)  promethazine (PHENERGAN) injection 6.25-12.5 mg (not administered)  ketorolac (TORADOL) 30 MG/ML injection 30 mg (not administered)  morphine 4 MG/ML injection 4 mg (4 mg Intravenous Given 04/01/17 0905)  ondansetron (ZOFRAN) injection 4 mg (4 mg Intravenous Given 04/01/17 0906)  sodium chloride 0.9 % bolus 1,000 mL (0 mLs Intravenous Stopped 04/01/17  1148)  cefTRIAXone (ROCEPHIN) 1 g in dextrose 5 % 50 mL IVPB (0 g Intravenous Stopped 04/01/17 1022)  azithromycin (ZITHROMAX) tablet 1,000 mg (1,000 mg Oral Given 04/01/17 0916)  ketorolac (TORADOL) 30 MG/ML injection 15 mg (15 mg Intravenous Given 04/01/17 0916)  iopamidol (ISOVUE-300) 61 % injection 100 mL (100 mLs Intravenous Contrast Given 04/01/17 1018)  piperacillin-tazobactam (ZOSYN) IVPB 3.375 g (3.375 g Intravenous New Bag/Given 04/01/17 1232)  lactated ringers infusion (1,000 mLs  New Bag/Given 04/01/17 1358)     Initial Impression / Assessment and Plan / ED Course  I have reviewed the triage vital signs and the nursing notes.  Pertinent labs & imaging results that were available during my care of the patient were reviewed by me and considered in my medical decision making (see chart for details).     26 yo F With a chief complaints of lower abdominal pain. Very low on my exam. Will obtain a pelvic.  Pelvic exam with cervical motion tenderness. Diffuse discharge. Concern for PID will start on Rocephin and azithromycin. The patient spiked a fever prior to discharge. Concern for possible tubo-ovarian abscess a CT scan was ordered. CT consistent with acute appendicitis. Will add Flagyl to her regimen. Discussed with general surgery.  The patients results and plan were reviewed and discussed.   Any x-rays performed were independently reviewed by myself.   Differential diagnosis were considered with the presenting HPI.  Medications  iopamidol (ISOVUE-300) 61 % injection (not administered)  metroNIDAZOLE (FLAGYL) IVPB 500 mg ( Intravenous MAR Hold 04/01/17 1258)  piperacillin-tazobactam (ZOSYN) IVPB 3.375 g ( Intravenous Automatically Held 04/09/17 1800)  bupivacaine liposome (EXPAREL) 1.3 % injection 266 mg (not administered)  HYDROmorphone (DILAUDID) injection 0.25-0.5 mg (not administered)  promethazine (PHENERGAN) injection 6.25-12.5 mg (not administered)  ketorolac (TORADOL) 30  MG/ML injection 30 mg (not administered)  morphine 4 MG/ML injection 4 mg (4 mg Intravenous Given 04/01/17 0905)  ondansetron (ZOFRAN) injection 4 mg (4 mg Intravenous Given 04/01/17 0906)  sodium chloride 0.9 % bolus 1,000 mL (0 mLs Intravenous Stopped 04/01/17 1148)  cefTRIAXone (ROCEPHIN) 1 g in dextrose 5 % 50 mL IVPB (0 g Intravenous Stopped 04/01/17 1022)  azithromycin (ZITHROMAX) tablet 1,000 mg (1,000 mg Oral Given 04/01/17 0916)  ketorolac (TORADOL) 30 MG/ML injection 15 mg (15 mg Intravenous Given 04/01/17 0916)  iopamidol (ISOVUE-300) 61 % injection 100 mL (100 mLs Intravenous Contrast Given 04/01/17 1018)  piperacillin-tazobactam (ZOSYN) IVPB 3.375 g (3.375 g Intravenous New Bag/Given 04/01/17 1232)  lactated ringers infusion (1,000 mLs  New Bag/Given 04/01/17 1358)    Vitals:   04/01/17 1500 04/01/17 1515 04/01/17 1530 04/01/17 1545  BP: 127/76 124/64 (!) 144/85 (!) 143/73  Pulse: 86 76 89 86  Resp:  13 12 14 15   Temp:      TempSrc:      SpO2: 100% 100% 100% 100%  Weight:      Height:        Final diagnoses:  Acute appendicitis with localized peritonitis    Admission/ observation were discussed with the admitting physician, patient and/or family and they are comfortable with the plan.   Final Clinical Impressions(s) / ED Diagnoses   Final diagnoses:  Acute appendicitis with localized peritonitis    New Prescriptions Current Discharge Medication List       Deno Etienne, DO 04/01/17 1600

## 2017-04-01 NOTE — H&P (Signed)
[facility]' \\Admission'$  Note  Sally Taylor 06/06/1991  [id number].    Requesting MD: Tyrone Nine, MD Chief Complaint/Reason for Consult: acute appendicitis   HPI:  Sally Taylor is a 26 y.o. Female with PMH HTN who presented to [facility] with a cc abdominal pain. Patients reports cramping, non-radiating upper abdominal pain that started yesterday morning and then gradually become worse and localized to her RLQ. She denies similar pain in the past. Associated symptoms include fever, nausea, and vomiting last night. She denies  urinary symptoms. Her LMP was 2 weeks ago. She denies any history of surgery. She has NKDA. She currently smokes cigarettes, about 1/2 ppd. Denies alcohol or illicit drug use. 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 Her mother is at the bedside. The patient reports drinking Gatorade (at the bedside) to take medication around 8-9 AM. Since that time she has had a couple sips of Gatorade in the past hour.   ED workup:  Fever 100.4, leukocytosis 18,900, Abd CT scan w/ 10 mm appendicolith at the base of the appendix with dilated appendix to 13 mm and surrounding inflammatory changes. A small amount of free fluid in the pelvis.   ROS: Review of Systems  Constitutional: Positive for fever. Negative for chills.  Gastrointestinal: Positive for abdominal pain, nausea and vomiting. Negative for blood in stool, constipation, diarrhea and melena.  All other systems reviewed and are negative.   History reviewed. No pertinent family history.  Past Medical History:  Diagnosis Date  . Hypertension     History reviewed. No pertinent surgical history.  Social History:  reports that she has been smoking.  She has never used smokeless tobacco. She reports that she drinks alcohol. She reports that she does not use drugs.  Allergies: No Known Allergies   (Not in a hospital admission)  Blood pressure  133/71, pulse 85, temperature (!) 100.4 F (38 C), temperature source Oral, resp. rate 20, last menstrual period 03/14/2017, SpO2 100 %. Physical Exam: Physical Exam  Constitutional: She is oriented to person, place, and time. She appears well-developed and well-nourished. No distress.  HENT:  Head: Normocephalic and atraumatic.  Right Ear: External ear normal.  Left Ear: External ear normal.  Eyes: EOM are normal. Right eye exhibits no discharge. Left eye exhibits no discharge. No scleral icterus.  Neck: Neck supple. No tracheal deviation present. No thyromegaly present.  Cardiovascular: Normal rate, regular rhythm and normal heart sounds.  Exam reveals no friction rub.   No murmur heard. Pulmonary/Chest: Effort normal and breath sounds normal. No respiratory distress. She has no wheezes. She has no rales.  Abdominal: Soft. Bowel sounds are normal. She exhibits no distension. There is tenderness. There is rebound. No hernia.  Diffusely TTP. Palpation of LLQ elicits RLQ pain. Significant RLQ tenderness with rebound.  Musculoskeletal: Normal range of motion. She exhibits no edema, tenderness or deformity.  Neurological: She is alert and oriented to person, place, and time. No sensory deficit.  Skin: Skin is warm and dry. No rash noted. She is not diaphoretic.  Psychiatric: She has a normal mood and affect. Her behavior is normal.    Results for orders placed or performed during the hospital encounter of 04/01/17 (from the past 48 hour(s))  Lipase, blood     Status: None   Collection Time: 04/01/17  4:46 AM  Result Value Ref Range   Lipase 18 11 - 51 U/L  Comprehensive metabolic  panel     Status: Abnormal   Collection Time: 04/01/17  4:46 AM  Result Value Ref Range   Sodium 137 135 - 145 mmol/L   Potassium 3.6 3.5 - 5.1 mmol/L   Chloride 100 (L) 101 - 111 mmol/L   CO2 26 22 - 32 mmol/L   Glucose, Bld 106 (H) 65 - 99 mg/dL   BUN 8 6 - 20 mg/dL   Creatinine, Ser 0.80 0.44 - 1.00 mg/dL    Calcium 9.5 8.9 - 10.3 mg/dL   Total Protein 8.4 (H) 6.5 - 8.1 g/dL   Albumin 4.6 3.5 - 5.0 g/dL   AST 20 15 - 41 U/L   ALT 20 14 - 54 U/L   Alkaline Phosphatase 67 38 - 126 U/L   Total Bilirubin 0.9 0.3 - 1.2 mg/dL   GFR calc non Af Amer >60 >60 mL/min   GFR calc Af Amer >60 >60 mL/min    Comment: (NOTE) The eGFR has been calculated using the CKD EPI equation. This calculation has not been validated in all clinical situations. eGFR's persistently <60 mL/min signify possible Chronic Kidney Disease.    Anion gap 11 5 - 15  CBC     Status: Abnormal   Collection Time: 04/01/17  4:46 AM  Result Value Ref Range   WBC 18.9 (H) 4.0 - 10.5 K/uL   RBC 5.15 (H) 3.87 - 5.11 MIL/uL   Hemoglobin 13.5 12.0 - 15.0 g/dL   HCT 40.0 36.0 - 46.0 %   MCV 77.7 (L) 78.0 - 100.0 fL   MCH 26.2 26.0 - 34.0 pg   MCHC 33.8 30.0 - 36.0 g/dL   RDW 14.2 11.5 - 15.5 %   Platelets 454 (H) 150 - 400 K/uL  Urinalysis, Routine w reflex microscopic     Status: Abnormal   Collection Time: 04/01/17  4:46 AM  Result Value Ref Range   Color, Urine YELLOW YELLOW   APPearance HAZY (A) CLEAR   Specific Gravity, Urine 1.021 1.005 - 1.030   pH 5.0 5.0 - 8.0   Glucose, UA NEGATIVE NEGATIVE mg/dL   Hgb urine dipstick MODERATE (A) NEGATIVE   Bilirubin Urine NEGATIVE NEGATIVE   Ketones, ur 80 (A) NEGATIVE mg/dL   Protein, ur NEGATIVE NEGATIVE mg/dL   Nitrite NEGATIVE NEGATIVE   Leukocytes, UA NEGATIVE NEGATIVE   RBC / HPF 0-5 0 - 5 RBC/hpf   WBC, UA 0-5 0 - 5 WBC/hpf   Bacteria, UA RARE (A) NONE SEEN   Squamous Epithelial / LPF 6-30 (A) NONE SEEN   Mucus PRESENT   I-Stat beta hCG blood, ED     Status: None   Collection Time: 04/01/17  4:54 AM  Result Value Ref Range   I-stat hCG, quantitative <5.0 <5 mIU/mL   Comment 3            Comment:   GEST. AGE      CONC.  (mIU/mL)   <=1 WEEK        5 - 50     2 WEEKS       50 - 500     3 WEEKS       100 - 10,000     4 WEEKS     1,000 - 30,000        FEMALE AND  NON-PREGNANT FEMALE:     LESS THAN 5 mIU/mL   Wet prep, genital     Status: Abnormal   Collection Time: 04/01/17  8:42 AM  Result Value Ref Range   Yeast Wet Prep HPF POC NONE SEEN NONE SEEN   Trich, Wet Prep NONE SEEN NONE SEEN   Clue Cells Wet Prep HPF POC PRESENT (A) NONE SEEN   WBC, Wet Prep HPF POC FEW (A) NONE SEEN   Sperm NONE SEEN    Ct Abdomen Pelvis W Contrast  Result Date: 04/01/2017 CLINICAL DATA:  Right lower quadrant pain, vomiting. EXAM: CT ABDOMEN AND PELVIS WITH CONTRAST TECHNIQUE: Multidetector CT imaging of the abdomen and pelvis was performed using the standard protocol following bolus administration of intravenous contrast. CONTRAST:  175m ISOVUE-300 IOPAMIDOL (ISOVUE-300) INJECTION 61% COMPARISON:  None. FINDINGS: Lower chest: Lung bases are clear. No effusions. Heart is normal size. Hepatobiliary: No focal hepatic abnormality. Gallbladder unremarkable. Pancreas: No focal abnormality or ductal dilatation. Spleen: No focal abnormality.  Normal size. Adrenals/Urinary Tract: Cyst in the midpole of the left kidney. No hydronephrosis. Adrenal glands and urinary bladder unremarkable. Stomach/Bowel: 10 mm appendicolith at the base of the appendix with dilated appendix to 13 mm and surrounding inflammatory change. Findings compatible with acute appendicitis. Stomach, large and small bowel grossly unremarkable. Vascular/Lymphatic: No evidence of aneurysm or adenopathy. Reproductive: Uterus and adnexa unremarkable.  No mass. Other: A small amount of free fluid in the pelvis.  No free air. Musculoskeletal: No acute bony abnormality. IMPRESSION: Changes of acute appendicitis. 10 mm appendicolith at the base of the appendix. Electronically Signed   By: KRolm BaptiseM.D.   On: 04/01/2017 10:26    Assessment/Plan Acute appendicitis - NPO, IVF, antiemetics and analgesics. OR today for laparoscopic appendectomy. IV Zosyn.  PMH HTN - PRN hydralzine  FEN: NPO, IVF ID: Rocephin/Flagyl 9/13,  Zosyn 9/13 >>  VTE: SCD's, start chemical VTE post-operatively    EJill Alexanders PAdvocate Condell Ambulatory Surgery Center LLCSurgery 04/01/2017, 11:34 AM Pager: [phone]: [phone]-Fri 7:00 am-4:30 pm Sat-Sun 7:00 am-11:30 am

## 2017-04-01 NOTE — Anesthesia Preprocedure Evaluation (Addendum)
Anesthesia Evaluation  Patient identified by MRN, date of birth, ID band Patient awake    Reviewed: Allergy & Precautions, NPO status , Patient's Chart, lab work & pertinent test results  Airway Mallampati: II  TM Distance: >3 FB Neck ROM: Full    Dental  (+) Dental Advisory Given   Pulmonary Current Smoker,    breath sounds clear to auscultation       Cardiovascular hypertension, negative cardio ROS   Rhythm:Regular Rate:Normal     Neuro/Psych negative neurological ROS     GI/Hepatic Neg liver ROS, Acute appendicitis   Endo/Other  negative endocrine ROS  Renal/GU negative Renal ROS     Musculoskeletal   Abdominal   Peds  Hematology negative hematology ROS (+)   Anesthesia Other Findings   Reproductive/Obstetrics                            Lab Results  Component Value Date   WBC 18.9 (H) 04/01/2017   HGB 13.5 04/01/2017   HCT 40.0 04/01/2017   MCV 77.7 (L) 04/01/2017   PLT 454 (H) 04/01/2017   Lab Results  Component Value Date   CREATININE 0.80 04/01/2017   BUN 8 04/01/2017   NA 137 04/01/2017   K 3.6 04/01/2017   CL 100 (L) 04/01/2017   CO2 26 04/01/2017    Anesthesia Physical Anesthesia Plan  ASA: II and emergent  Anesthesia Plan: General   Post-op Pain Management:    Induction: Intravenous and Rapid sequence  PONV Risk Score and Plan: 3 and Ondansetron, Dexamethasone, Midazolam and Treatment may vary due to age or medical condition  Airway Management Planned: Oral ETT  Additional Equipment:   Intra-op Plan:   Post-operative Plan: Extubation in OR  Informed Consent: I have reviewed the patients History and Physical, chart, labs and discussed the procedure including the risks, benefits and alternatives for the proposed anesthesia with the patient or authorized representative who has indicated his/her understanding and acceptance.   Dental advisory  given  Plan Discussed with: CRNA  Anesthesia Plan Comments:        Anesthesia Quick Evaluation

## 2017-04-01 NOTE — Op Note (Signed)
Sally Taylor   Surgeon: Kaylyn Lim, MD, FACS  Asst:  none  Anes:  general  Preop Dx: Acute appendicitis  Postop Dx: same  Procedure: Laparoscopic appendectomy Location Surgery: [facility] 4 Complications: none  EBL:   minimal cc  Drains: none  Description of Procedure:  The patient was taken to OR 4 .  After anesthesia was administered and the patient was prepped a timeout was performed.  Access achieved with a Hassan technique through the umbilicus without difficulty.  At the end of the procedure, this was closed with two 0 vicryl sutures under lap vision.    Two 5 mm ports were placed in the left lower and right upper quadrants. With the 5 mm scope in the LLQ port the ileocecal region was explored and the inflamed appendix with a necrotic shaft was isolated and the base dissected to all it to be stapled from the cecum.  This was done with the 4.5 Ethicon with the vascular load.  The mesentery was transected with the Harmonic scalpel and the specimen was placed into a bag and removed.  Port sites were injected with Exparel.  The umbilicus was closed as above and after releasing the pneumoperitoneum, the skin was closed with 4-0 Monocryl and Dermabond.    The patient tolerated the procedure well and was taken to the PACU in stable condition.     Matt B. Hassell Done, [place], [facility], [place]

## 2017-04-01 NOTE — ED Notes (Signed)
Pt transported to OR

## 2017-04-01 NOTE — ED Triage Notes (Signed)
Pt states she has right lower abd pain and vomiting that started yesterday morning around 0630

## 2017-04-01 NOTE — Anesthesia Procedure Notes (Signed)
Procedure Name: Intubation Date/Time: 04/01/2017 1:23 PM Performed by: Glory Buff Pre-anesthesia Checklist: Patient identified, Emergency Drugs available, Suction available and Patient being monitored Patient Re-evaluated:Patient Re-evaluated prior to induction Oxygen Delivery Method: Circle system utilized Preoxygenation: Pre-oxygenation with 100% oxygen Induction Type: IV induction Ventilation: Mask ventilation without difficulty Laryngoscope Size: Miller and 3 Grade View: Grade I Tube type: Oral Tube size: 7.0 mm Number of attempts: 1 Airway Equipment and Method: Stylet and Oral airway Placement Confirmation: ETT inserted through vocal cords under direct vision,  positive ETCO2 and breath sounds checked- equal and bilateral Secured at: 21 cm Tube secured with: Tape Dental Injury: Teeth and Oropharynx as per pre-operative assessment

## 2017-04-01 NOTE — Progress Notes (Signed)
Patient is admitted to 1537 from PACU. Admission VS is stable and family is at the bedside

## 2017-04-01 NOTE — Interval H&P Note (Signed)
History and Physical Interval Note:  04/01/2017 1:09 PM  Sally Taylor  has presented today for surgery, with the diagnosis of appendicitis  The various methods of treatment have been discussed with the patient and family. After consideration of risks, benefits and other options for treatment, the patient has consented to  Procedure(s): APPENDECTOMY LAPAROSCOPIC (N/A) as a surgical intervention .  The patient's history has been reviewed, patient examined, no change in status, stable for surgery.  I have reviewed the patient's chart and labs.  Questions were answered to the patient's satisfaction.     Bhavesh Vazquez B

## 2017-04-01 NOTE — Transfer of Care (Signed)
Immediate Anesthesia Transfer of Care Note  Patient: Sally Taylor  Procedure(s) Performed: Procedure(s): APPENDECTOMY LAPAROSCOPIC (N/A)  Patient Location: PACU  Anesthesia Type:General  Level of Consciousness: awake, alert  and oriented  Airway & Oxygen Therapy: Patient Spontanous Breathing and Patient connected to face mask oxygen  Post-op Assessment: Report given to RN and Post -op Vital signs reviewed and stable  Post vital signs: Reviewed and stable  Last Vitals:  Vitals:   04/01/17 0850 04/01/17 1226  BP: 133/71 114/77  Pulse: 85 83  Resp: 20 16  Temp: (!) 38 C 37.5 C  SpO2: 100% 100%    Last Pain:  Vitals:   04/01/17 1252  TempSrc:   PainSc: 7       Patients Stated Pain Goal: 4 (52/08/02 2336)  Complications: No apparent anesthesia complications

## 2017-04-01 NOTE — ED Notes (Signed)
Pt in CT.

## 2017-04-02 LAB — HIV ANTIBODY (ROUTINE TESTING W REFLEX): HIV Screen 4th Generation wRfx: NONREACTIVE

## 2017-04-02 LAB — GC/CHLAMYDIA PROBE AMP (~~LOC~~) NOT AT ARMC
CHLAMYDIA, DNA PROBE: NEGATIVE
NEISSERIA GONORRHEA: NEGATIVE

## 2017-04-02 LAB — RPR: RPR: NONREACTIVE

## 2017-04-02 MED ORDER — HYDROCODONE-ACETAMINOPHEN 5-325 MG PO TABS: 1.0000 | ORAL_TABLET | ORAL | 0 refills | Status: DC | PRN

## 2017-04-02 MED ORDER — ACETAMINOPHEN 325 MG PO TABS: 325.0000 mg | ORAL_TABLET | Freq: Four times a day (QID) | ORAL | Status: DC | PRN

## 2017-04-02 NOTE — Progress Notes (Signed)
Coupon from [employer] provided to pt to obtain her Vicodin 15tabs for $5. Marney Doctor RN,BSN,NCM [phone]

## 2017-04-02 NOTE — Progress Notes (Signed)
Reviewed AVS and discharge summary w/pt. She verbalized understanding and provided teach back. Pt discharged home in stable condition with pain controlled via WC w/all belongings and prescriptions.

## 2017-04-02 NOTE — Discharge Summary (Signed)
[facility] Discharge Summary   Patient ID: Sally Taylor MRN: [id number] DOB/AGE: Apr 16, 1991 26 y.o.  Admit date: 04/01/2017 Discharge date: 04/02/2017  Discharge Diagnosis Patient Active Problem List   Diagnosis Date Noted  . Appendicitis 04/01/2017   Imaging: Ct Abdomen Pelvis W Contrast  Result Date: 04/01/2017 CLINICAL DATA:  Right lower quadrant pain, vomiting. EXAM: CT ABDOMEN AND PELVIS WITH CONTRAST TECHNIQUE: Multidetector CT imaging of the abdomen and pelvis was performed using the standard protocol following bolus administration of intravenous contrast. CONTRAST:  135mL ISOVUE-300 IOPAMIDOL (ISOVUE-300) INJECTION 61% COMPARISON:  None. FINDINGS: Lower chest: Lung bases are clear. No effusions. Heart is normal size. Hepatobiliary: No focal hepatic abnormality. Gallbladder unremarkable. Pancreas: No focal abnormality or ductal dilatation. Spleen: No focal abnormality.  Normal size. Adrenals/Urinary Tract: Cyst in the midpole of the left kidney. No hydronephrosis. Adrenal glands and urinary bladder unremarkable. Stomach/Bowel: 10 mm appendicolith at the base of the appendix with dilated appendix to 13 mm and surrounding inflammatory change. Findings compatible with acute appendicitis. Stomach, large and small bowel grossly unremarkable. Vascular/Lymphatic: No evidence of aneurysm or adenopathy. Reproductive: Uterus and adnexa unremarkable.  No mass. Other: A small amount of free fluid in the pelvis.  No free air. Musculoskeletal: No acute bony abnormality. IMPRESSION: Changes of acute appendicitis. 10 mm appendicolith at the base of the appendix. Electronically Signed   By: Rolm Baptise M.D.   On: 04/01/2017 10:26    Procedures Dr. Johnathan Hausen (04/01/17) - Laparoscopic Appendectomy  Hospital Course:  26 y.o. Female with a PMH HTN who presented to [facility] with 24 hours of abdominal pain associated with N/V.  Workup showed acute appendicitis (see CT above) and  leukocytosis of 18,900.  Patient was admitted and underwent procedure listed above.  Tolerated procedure well and was transferred to the floor.  Diet was advanced as tolerated.  On POD#1 the patient was voiding well, tolerating diet, ambulating well, pain well controlled, vital signs stable, incisions c/d/i and felt stable for discharge home.  Patient will follow up in our office in 2 weeks and knows to call with questions or concerns.  I have personally reviewed the patients medication history on the [place] controlled substance database.  Physical Exam: General:  Alert, NAD, pleasant, comfortable Abd:  Soft, ND, appropriately tender, incisions C/D/I  Allergies as of 04/02/2017   No Known Allergies     Medication List    TAKE these medications   acetaminophen 325 MG tablet Commonly known as:  TYLENOL Take 1 tablet (325 mg total) by mouth every 6 (six) hours as needed for mild pain, moderate pain or headache.   HYDROcodone-acetaminophen 5-325 MG tablet Commonly known as:  NORCO/VICODIN Take 1-2 tablets by mouth every 4 (four) hours as needed for moderate pain or severe pain.   ibuprofen 200 MG tablet Commonly known as:  ADVIL,MOTRIN Take 800 mg by mouth every 6 (six) hours as needed for fever, headache, mild pain, moderate pain or cramping.            Discharge Care Instructions        Start     Ordered   04/02/17 0000  HYDROcodone-acetaminophen (NORCO/VICODIN) 5-325 MG tablet  Every 4 hours PRN    Question:  Supervising Provider  Answer:  Johnathan Hausen   04/02/17 0919   04/02/17 0000  acetaminophen (TYLENOL) 325 MG tablet  Every 6 hours PRN     04/02/17 0919   04/02/17 0000  Discharge patient    Question Answer  Comment  Discharge disposition 01-Home or Self Care   Discharge patient date 04/02/2017      04/02/17 0941       Follow-up Information    [facility]. Go to.   Why:   Please use this location for your PHARMACY needs.  Please  make an appointment at this location for FINANCIAL COUNSELING. Contact information: [place] [address] [facility], [place]. Go on 04/15/2017.   Specialty:  General Surgery Why:  for post-operative follow up. call our office to confirm appointment date/time. please arrive 30 minutes prior to scheduled appointment to get checked in and fill out any necessary paperwork.  Contact information: [address] [place] [phone]          Signed: Obie Dredge, [facility] 04/02/2017, 12:25 PM Pager: [phone] Consults: [phone] Mon-Fri 7:00 am-4:30 pm Sat-Sun 7:00 am-11:30 am

## 2017-04-02 NOTE — Discharge Instructions (Signed)
please arrive at least 30 min before your appointment to complete your check in paperwork.  If you are unable to arrive 30 min prior to your appointment time we may have to cancel or reschedule you. ° °LAPAROSCOPIC SURGERY: POST OP INSTRUCTIONS  °1. DIET: Follow a light bland diet the first 24 hours after arrival home, such as soup, liquids, crackers, etc. Be sure to include lots of fluids daily. Avoid fast food or heavy meals as your are more likely to get nauseated. Eat a low fat the next few days after surgery.  °2. Take your usually prescribed home medications unless otherwise directed. °3. PAIN CONTROL:  °1. Pain is best controlled by a usual combination of three different methods TOGETHER:  °1. Ice/Heat °2. Over the counter pain medication °3. Prescription pain medication °2. Most patients will experience some swelling and bruising around the incisions. Ice packs or heating pads (30-60 minutes up to 6 times a day) will help. Use ice for the first few days to help decrease swelling and bruising, then switch to heat to help relax tight/sore spots and speed recovery. Some people prefer to use ice alone, heat alone, alternating between ice & heat. Experiment to what works for you. Swelling and bruising can take several weeks to resolve.  °3. It is helpful to take an over-the-counter pain medication regularly for the first few weeks. Choose one of the following that works best for you:  °1. Naproxen (Aleve, etc) Two 220mg tabs twice a day °2. Ibuprofen (Advil, etc) Three 200mg tabs four times a day (every meal & bedtime) °3. Acetaminophen (Tylenol, etc) 500-650mg four times a day (every meal & bedtime) °4. A prescription for pain medication (such as oxycodone, hydrocodone, etc) should be given to you upon discharge. Take your pain medication as prescribed.  °1. If you are having problems/concerns with the prescription medicine (does not control pain, nausea, vomiting, rash, itching, etc), please call us [phone] to see if we need to switch you to a different pain medicine that will work better for you and/or control your side effect better. °2. If you need a refill on your pain medication, please contact your pharmacy. They will contact our office to request authorization. Prescriptions will not be filled after 5 pm or on week-ends. °4. Avoid getting constipated. Between the surgery and the pain medications, it is common to experience some constipation. Increasing fluid intake and taking a fiber supplement (such as Metamucil, Citrucel, FiberCon, MiraLax, etc) 1-2 times a day regularly will usually help prevent this problem from occurring. A mild laxative (prune juice, Milk of Magnesia, MiraLax, etc) should be taken according to package directions if there are no bowel movements after 48 hours.  °5. Watch out for diarrhea. If you have many loose bowel movements, simplify your diet to bland foods & liquids for a few days. Stop any stool softeners and decrease your fiber supplement. Switching to mild anti-diarrheal medications (Kayopectate, Pepto Bismol) can help. If this worsens or does not improve, please call us. °6. Wash / shower every day. You may shower over the dressings as they are waterproof. Continue to shower over incision(s) after the dressing is off. °7. Remove your waterproof bandages 5 days after surgery. You may leave the incision open to air. You may replace a dressing/Band-Aid to cover the incision for comfort if you wish.  °8. ACTIVITIES as tolerated:  °1. You may resume regular (light) daily activities beginning the next day--such as daily self-care, walking, climbing stairs--gradually   increasing activities as tolerated. If you can walk 30 minutes without difficulty, it is safe to try more intense activity such as jogging, treadmill, bicycling, low-impact aerobics, swimming, etc. °2. Save the most intensive and strenuous activity for last such as sit-ups, heavy lifting, contact sports, etc Refrain  from any heavy lifting or straining until you are off narcotics for pain control.  °3. DO NOT PUSH THROUGH PAIN. Let pain be your guide: If it hurts to do something, don't do it. Pain is your body warning you to avoid that activity for another week until the pain goes down. °4. You may drive when you are no longer taking prescription pain medication, you can comfortably wear a seatbelt, and you can safely maneuver your car and apply brakes. °5. You may have sexual intercourse when it is comfortable.  °9. FOLLOW UP in our office  °1. Please call [facility] at [phone] to set up an appointment to see your surgeon in the office for a follow-up appointment approximately 2-3 weeks after your surgery. °2. Make sure that you call for this appointment the day you arrive home to insure a convenient appointment time. °     10. IF YOU HAVE DISABILITY OR FAMILY LEAVE FORMS, BRING THEM TO THE               OFFICE FOR PROCESSING.  ° °WHEN TO CALL US [phone]:  °1. Poor pain control °2. Reactions / problems with new medications (rash/itching, nausea, etc)  °3. Fever over 101.5 F (38.5 C) °4. Inability to urinate °5. Nausea and/or vomiting °6. Worsening swelling or bruising °7. Continued bleeding from incision. °8. Increased pain, redness, or drainage from the incision ° °The clinic staff is available to answer your questions during regular business hours (8:30am-5pm). Please don’t hesitate to call and ask to speak to one of our nurses for clinical concerns.  °If you have a medical emergency, go to the nearest emergency room or call 911.  °A surgeon from [facility] is always on call at the hospitals  ° °[facility]  °[address], [address], [place], [place] [address] ?  °MAIN: [phone] ? TOLL FREE: [phone] ?  °FAX [phone]  °[email or web address] ° °

## 2017-11-10 ENCOUNTER — Ambulatory Visit: Payer: Medicaid Other | Admitting: Obstetrics & Gynecology

## 2018-08-22 ENCOUNTER — Encounter: Payer: Self-pay | Admitting: Obstetrics and Gynecology

## 2018-08-22 ENCOUNTER — Ambulatory Visit (INDEPENDENT_AMBULATORY_CARE_PROVIDER_SITE_OTHER): Payer: Managed Care, Other (non HMO) | Admitting: Obstetrics and Gynecology

## 2018-08-22 ENCOUNTER — Other Ambulatory Visit (HOSPITAL_COMMUNITY)
Admission: RE | Admit: 2018-08-22 | Discharge: 2018-08-22 | Disposition: A | Discharge status: Home / Self Care | Payer: Managed Care, Other (non HMO) | Source: Ambulatory Visit | Attending: Obstetrics and Gynecology | Admitting: Obstetrics and Gynecology

## 2018-08-22 VITALS — BP 131/87 | HR 82 | Ht 62.0 in | Wt 200.6 lb

## 2018-08-22 DIAGNOSIS — Z01419 Encounter for gynecological examination (general) (routine) without abnormal findings: Secondary | ICD-10-CM | POA: Insufficient documentation

## 2018-08-22 NOTE — Progress Notes (Signed)
Subjective:     Sally Taylor is a 28 y.o. female G0 with LMP 08/21/18 and BMI 36 who is here for a comprehensive physical exam. The patient reports no problems. She is currently not sexually active and is not interested in contraception. She denies any pelvic pain or abnormal discharge. She reports a monthly period lasting 4 days.  Past Medical History:  Diagnosis Date  . Hypertension    Past Surgical History:  Procedure Laterality Date  . LAPAROSCOPIC APPENDECTOMY N/A 04/01/2017   Procedure: APPENDECTOMY LAPAROSCOPIC;  Surgeon: Johnathan Hausen, MD;  Location: [facility];  Service: General;  Laterality: N/A;   No family history on file.  Social History   Socioeconomic History  . Marital status: Single    Spouse name: Not on file  . Number of children: Not on file  . Years of education: Not on file  . Highest education level: Not on file  Occupational History  . Not on file  Social Needs  . Financial resource strain: Not on file  . Food insecurity:    Worry: Not on file    Inability: Not on file  . Transportation needs:    Medical: Not on file    Non-medical: Not on file  Tobacco Use  . Smoking status: Current Every Day Smoker  . Smokeless tobacco: Never Used  Substance and Sexual Activity  . Alcohol use: Yes    Comment: occ  . Drug use: No  . Sexual activity: Not on file  Lifestyle  . Physical activity:    Days per week: Not on file    Minutes per session: Not on file  . Stress: Not on file  Relationships  . Social connections:    Talks on phone: Not on file    Gets together: Not on file    Attends religious service: Not on file    Active member of club or organization: Not on file    Attends meetings of clubs or organizations: Not on file    Relationship status: Not on file  . Intimate partner violence:    Fear of current or ex partner: Not on file    Emotionally abused: Not on file    Physically abused: Not on file    Forced sexual activity: Not  on file  Other Topics Concern  . Not on file  Social History Narrative  . Not on file   Health Maintenance  Topic Date Due  . TETANUS/TDAP  01/06/2010  . PAP-Cervical Cytology Screening  01/07/2012  . PAP SMEAR-Modifier  01/07/2012  . INFLUENZA VACCINE  02/17/2018  . HIV Screening  Completed       Review of Systems Pertinent items are noted in HPI.   Objective:  Blood pressure 131/87, pulse 82, height 5\' 2"  (1.575 m), weight 200 lb 9.6 oz (91 kg), last menstrual period 08/21/2018.     GENERAL: Well-developed, well-nourished female in no acute distress.  HEENT: Normocephalic, atraumatic. Sclerae anicteric.  NECK: Supple. Normal thyroid.  LUNGS: Clear to auscultation bilaterally.  HEART: Regular rate and rhythm. BREASTS: Symmetric in size. No palpable masses or lymphadenopathy, skin changes, or nipple drainage. ABDOMEN: Soft, nontender, nondistended. No organomegaly. PELVIC: Normal external female genitalia. Vagina is pink and rugated.  Normal discharge. Normal appearing cervix. Uterus is normal in size. No adnexal mass or tenderness. EXTREMITIES: No cyanosis, clubbing, or edema, 2+ distal pulses.    Assessment:    Healthy female exam.      Plan:    Pap  smear collected STI screen per patient request Patient is scheduled to see PCP on Wednesday for management of HTN Patient will be contacted with abnormal results See After Visit Summary for Counseling Recommendations

## 2018-08-22 NOTE — Progress Notes (Signed)
New GYN, in office for annual. Pt reports last pap 2018, denies being sexually active, declines BC. LMP 08-21-18. Pt desires std testing today.

## 2018-08-23 LAB — CYTOLOGY - PAP
Bacterial vaginitis: POSITIVE — AB
Candida vaginitis: NEGATIVE
Chlamydia: NEGATIVE
Diagnosis: NEGATIVE
Neisseria Gonorrhea: NEGATIVE
Trichomonas: NEGATIVE

## 2018-08-23 LAB — HEPATITIS C ANTIBODY: Hep C Virus Ab: <0.1 {s_co_ratio} (ref 0.0–0.9)

## 2018-08-23 LAB — HEPATITIS B SURFACE ANTIGEN: Hepatitis B Surface Ag: NEGATIVE

## 2018-08-23 LAB — HIV ANTIBODY (ROUTINE TESTING W REFLEX): HIV Screen 4th Generation wRfx: NONREACTIVE

## 2018-08-23 LAB — RPR: RPR Ser Ql: NONREACTIVE

## 2018-08-24 MED ORDER — METRONIDAZOLE 500 MG PO TABS: 500.0000 mg | ORAL_TABLET | Freq: Two times a day (BID) | ORAL | 0 refills | Status: DC

## 2018-08-24 NOTE — Addendum Note (Signed)
Addended by: Mora Bellman on: 08/24/2018 12:59 PM   Modules accepted: Orders

## 2018-11-14 ENCOUNTER — Ambulatory Visit: Payer: Medicaid Other | Admitting: Nurse Practitioner

## 2019-07-31 ENCOUNTER — Other Ambulatory Visit: Payer: Self-pay

## 2019-07-31 ENCOUNTER — Ambulatory Visit (INDEPENDENT_AMBULATORY_CARE_PROVIDER_SITE_OTHER): Payer: Self-pay

## 2019-07-31 VITALS — BP 142/83 | HR 82 | Ht 62.0 in | Wt 210.0 lb

## 2019-07-31 DIAGNOSIS — Z3201 Encounter for pregnancy test, result positive: Secondary | ICD-10-CM

## 2019-07-31 DIAGNOSIS — Z3400 Encounter for supervision of normal first pregnancy, unspecified trimester: Secondary | ICD-10-CM | POA: Insufficient documentation

## 2019-07-31 LAB — POCT URINE PREGNANCY: Preg Test, Ur: POSITIVE — AB

## 2019-07-31 MED ORDER — BLOOD PRESSURE KIT DEVI: 1.0000 | 0 refills | Status: DC

## 2019-07-31 NOTE — Progress Notes (Addendum)
[name] presents today for UPT. She has no unusual complaints.  LMP:06/15/2019     OBJECTIVE: Appears well, in no apparent distress.  OB History    Gravida  1   Para  0   Term  0   Preterm  0   AB  0   Living  0     SAB  0   TAB  0   Ectopic  0   Multiple  0   Live Births  0          Home UPT Result:POSITIVE In-Office UPT result:POSITIVE  I have reviewed the patient's medical, obstetrical, social, and family histories, and medications.   ASSESSMENT: Positive pregnancy test  PLAN Prenatal care to be completed at: [facility]  __________________________________  PRENATAL INTAKE SUMMARY  [name] presents today New OB Nurse Interview.  OB History    Gravida  1   Para  0   Term  0   Preterm  0   AB  0   Living  0     SAB  0   TAB  0   Ectopic  0   Multiple  0   Live Births  0          I have reviewed the patient's medical, obstetrical, social, and family histories, medications, and available lab results.  SUBJECTIVE She has no unusual complaints  OBJECTIVE Initial Physical Exam (New OB)  GENERAL APPEARANCE: alert, well appearing   ASSESSMENT Normal pregnancy.  LMP 06/15/2019 EDD 03/21/2020 [redacted]w[redacted]d  PLAN Prenatal care at [facility] BP Cuff ordered 07/31/2019 Patient downloaded BabyRx PNV samples given; CitraNatal Assure LOT [id number] EXP 04/2020 30 DAYS.

## 2019-07-31 NOTE — Progress Notes (Signed)
Patient seen and assessed by nursing staff during this encounter. I have reviewed the chart and agree with the documentation and plan.  Mora Bellman, MD 07/31/2019 11:44 AM

## 2019-08-09 ENCOUNTER — Encounter (HOSPITAL_COMMUNITY): Payer: Self-pay | Admitting: Obstetrics and Gynecology

## 2019-08-09 ENCOUNTER — Other Ambulatory Visit: Payer: Self-pay

## 2019-08-09 ENCOUNTER — Inpatient Hospital Stay (HOSPITAL_COMMUNITY)
Admission: AD | Admit: 2019-08-09 | Discharge: 2019-08-10 | Disposition: A | Discharge status: Home / Self Care | Payer: Self-pay | Source: Ambulatory Visit | Attending: Obstetrics and Gynecology | Admitting: Obstetrics and Gynecology

## 2019-08-09 DIAGNOSIS — A5901 Trichomonal vulvovaginitis: Secondary | ICD-10-CM

## 2019-08-09 DIAGNOSIS — O3680X0 Pregnancy with inconclusive fetal viability, not applicable or unspecified: Secondary | ICD-10-CM | POA: Insufficient documentation

## 2019-08-09 DIAGNOSIS — Z3A08 8 weeks gestation of pregnancy: Secondary | ICD-10-CM

## 2019-08-09 DIAGNOSIS — O23591 Infection of other part of genital tract in pregnancy, first trimester: Secondary | ICD-10-CM

## 2019-08-09 DIAGNOSIS — R102 Pelvic and perineal pain: Secondary | ICD-10-CM | POA: Insufficient documentation

## 2019-08-09 DIAGNOSIS — O26891 Other specified pregnancy related conditions, first trimester: Secondary | ICD-10-CM | POA: Insufficient documentation

## 2019-08-09 DIAGNOSIS — O209 Hemorrhage in early pregnancy, unspecified: Secondary | ICD-10-CM | POA: Insufficient documentation

## 2019-08-09 DIAGNOSIS — Z3A01 Less than 8 weeks gestation of pregnancy: Secondary | ICD-10-CM | POA: Insufficient documentation

## 2019-08-09 NOTE — MAU Note (Signed)
Having some vag bleeding since yesterday. Color has been light red to brown color. Tonight saw alittle bright red blood in with the brown and made me nervous. Mild cramping.

## 2019-08-10 ENCOUNTER — Encounter: Payer: Self-pay | Admitting: Advanced Practice Midwife

## 2019-08-10 ENCOUNTER — Inpatient Hospital Stay (HOSPITAL_COMMUNITY): Payer: Self-pay

## 2019-08-10 DIAGNOSIS — O209 Hemorrhage in early pregnancy, unspecified: Secondary | ICD-10-CM | POA: Insufficient documentation

## 2019-08-10 LAB — URINALYSIS, ROUTINE W REFLEX MICROSCOPIC
Bacteria, UA: NONE SEEN
Bilirubin Urine: NEGATIVE
Glucose, UA: NEGATIVE mg/dL
Ketones, ur: NEGATIVE mg/dL
Leukocytes,Ua: NEGATIVE
Nitrite: NEGATIVE
Protein, ur: NEGATIVE mg/dL
Specific Gravity, Urine: 1.011 (ref 1.005–1.030)
pH: 7 (ref 5.0–8.0)

## 2019-08-10 LAB — WET PREP, GENITAL
Clue Cells Wet Prep HPF POC: NONE SEEN
Sperm: NONE SEEN

## 2019-08-10 LAB — ABO/RH: ABO/RH(D): O POS

## 2019-08-10 LAB — HIV ANTIBODY (ROUTINE TESTING W REFLEX): HIV Screen 4th Generation wRfx: NONREACTIVE

## 2019-08-10 LAB — HCG, QUANTITATIVE, PREGNANCY: hCG, Beta Chain, Quant, S: 8934 m[IU]/mL — ABNORMAL HIGH (ref <5)

## 2019-08-10 LAB — CBC
HCT: 35 % — ABNORMAL LOW (ref 36.0–46.0)
Hemoglobin: 11.4 g/dL — ABNORMAL LOW (ref 12.0–15.0)
MCH: 26.3 pg (ref 26.0–34.0)
MCHC: 32.6 g/dL (ref 30.0–36.0)
MCV: 80.6 fL (ref 80.0–100.0)
Platelets: 422 10*3/uL — ABNORMAL HIGH (ref 150–400)
RBC: 4.34 MIL/uL (ref 3.87–5.11)
RDW: 15.1 % (ref 11.5–15.5)
WBC: 11.6 10*3/uL — ABNORMAL HIGH (ref 4.0–10.5)
nRBC: 0 % (ref 0.0–0.2)

## 2019-08-10 LAB — GC/CHLAMYDIA PROBE AMP (~~LOC~~) NOT AT ARMC
Chlamydia: NEGATIVE
Comment: NEGATIVE
Comment: NORMAL
Neisseria Gonorrhea: NEGATIVE

## 2019-08-10 MED ORDER — METRONIDAZOLE 500 MG PO TABS
2000.0000 mg | ORAL_TABLET | Freq: Once | ORAL | Status: AC
  Administered 2019-08-10: 01:00:00 2000 mg via ORAL
  Filled 2019-08-10: qty 4

## 2019-08-10 NOTE — Discharge Instructions (Signed)
Threatened Miscarriage  A threatened miscarriage is when you have bleeding from your vagina during the first 20 weeks of pregnancy but the pregnancy does not end. Your doctor will do tests to make sure you are still pregnant. The cause of the bleeding may not be known. This condition does not mean your pregnancy will end, but it does increase the risk that it will end (complete miscarriage). Follow these instructions at home:  Get plenty of rest.  If you have bleeding in your vagina, do not have sex or use tampons.  Do not douche.  Do not smoke or use drugs.  Do not drink alcohol.  Avoid caffeine.  Keep all follow-up prenatal visits as told by your doctor. This is important. Contact a doctor if:  You have light bleeding from your vagina.  You have belly pain or cramping.  You have a fever. Get help right away if:  You have heavy bleeding from your vagina.  You have clots of blood coming from your vagina.  You pass tissue from your vagina.  You have a gush of fluid from your vagina.  You are leaking fluid from your vagina.  You have very bad pain or cramps in your low back or belly.  You have fever, chills, and very bad belly pain. Summary  A threatened miscarriage is when you have bleeding from your vagina during the first 20 weeks of pregnancy but the pregnancy does not end.  This condition does not mean your pregnancy will end, but it does increase the risk that it will end (complete miscarriage).  Get plenty of rest. If you have bleeding in your vagina, do not have sex or use tampons.  Keep all follow-up prenatal visits as told by your doctor. This is important. This information is not intended to replace advice given to you by your health care provider. Make sure you discuss any questions you have with your health care provider. Document Revised: 08/12/2017 Document Reviewed: 10/02/2016 Elsevier Patient Education  2020 [employer]  

## 2019-08-10 NOTE — MAU Provider Note (Signed)
Chief Complaint: Vaginal Bleeding   First Provider Initiated Contact with Patient 08/09/19 2353        SUBJECTIVE HPI: Sally Taylor is a 29 y.o. G1P0000 at 67w0dby LMP who presents to maternity admissions reporting vaginal bleeding starting yesterday. Happened off and on, at times brown, other times red.  Some mild cramping but no major pain. . She denies vaginal itching/burning, urinary symptoms, h/a, dizziness, n/v, or fever/chills.    Vaginal Bleeding The patient's primary symptoms include pelvic pain and vaginal bleeding. The patient's pertinent negatives include no genital itching, genital lesions, genital odor or vaginal discharge. This is a new problem. The current episode started yesterday. The problem occurs intermittently. The problem has been unchanged. The pain is mild. She is pregnant. Associated symptoms include abdominal pain. Pertinent negatives include no chills, constipation, diarrhea, dysuria, fever, headaches, nausea or vomiting. The vaginal discharge was bloody. The vaginal bleeding is spotting. She has not been passing clots. She has not been passing tissue. Nothing aggravates the symptoms. She has tried nothing for the symptoms.   RN Note: Having some vag bleeding since yesterday. Color has been light red to brown color. Tonight saw alittle bright red blood in with the brown and made me nervous. Mild cramping  Past Medical History:  Diagnosis Date  . Asthma   . Headache   . Hypertension    Past Surgical History:  Procedure Laterality Date  . LAPAROSCOPIC APPENDECTOMY N/A 04/01/2017   Procedure: APPENDECTOMY LAPAROSCOPIC;  Surgeon: MJohnathan Hausen MD;  Location: [facility];  Service: General;  Laterality: N/A;   Social History   Socioeconomic History  . Marital status: Single    Spouse name: Not on file  . Number of children: Not on file  . Years of education: Not on file  . Highest education level: Not on file  Occupational History  . Not on file   Tobacco Use  . Smoking status: Never Smoker  . Smokeless tobacco: Never Used  Substance and Sexual Activity  . Alcohol use: Not Currently    Comment: occ  . Drug use: No  . Sexual activity: Yes    Birth control/protection: None  Other Topics Concern  . Not on file  Social History Narrative  . Not on file   Social Determinants of Health   Financial Resource Strain:   . Difficulty of Paying Living Expenses: Not on file  Food Insecurity:   . Worried About RCharity fundraiserin the Last Year: Not on file  . Ran Out of Food in the Last Year: Not on file  Transportation Needs:   . Lack of Transportation (Medical): Not on file  . Lack of Transportation (Non-Medical): Not on file  Physical Activity:   . Days of Exercise per Week: Not on file  . Minutes of Exercise per Session: Not on file  Stress:   . Feeling of Stress : Not on file  Social Connections:   . Frequency of Communication with Friends and Family: Not on file  . Frequency of Social Gatherings with Friends and Family: Not on file  . Attends Religious Services: Not on file  . Active Member of Clubs or Organizations: Not on file  . Attends CArchivistMeetings: Not on file  . Marital Status: Not on file  Intimate Partner Violence:   . Fear of Current or Ex-Partner: Not on file  . Emotionally Abused: Not on file  . Physically Abused: Not on file  . Sexually Abused: Not  on file   No current facility-administered medications on file prior to encounter.   Current Outpatient Medications on File Prior to Encounter  Medication Sig Dispense Refill  . Prenatal Vit-Fe Fumarate-FA (PRENATAL MULTIVITAMIN) TABS tablet Take 1 tablet by mouth daily at 12 noon.    Marland Kitchen acetaminophen (TYLENOL) 325 MG tablet Take 1 tablet (325 mg total) by mouth every 6 (six) hours as needed for mild pain, moderate pain or headache. (Patient not taking: Reported on 08/22/2018)    . Blood Pressure Monitoring (BLOOD PRESSURE KIT) DEVI 1 kit by Does  not apply route once a week. Check Blood Pressure regularly and record readings into the [employer] App.  Large Cuff.  DX O90.0 1 each 0  . HYDROcodone-acetaminophen (NORCO/VICODIN) 5-325 MG tablet Take 1-2 tablets by mouth every 4 (four) hours as needed for moderate pain or severe pain. (Patient not taking: Reported on 08/22/2018) 15 tablet 0  . ibuprofen (ADVIL,MOTRIN) 200 MG tablet Take 800 mg by mouth every 6 (six) hours as needed for fever, headache, mild pain, moderate pain or cramping.    . metroNIDAZOLE (FLAGYL) 500 MG tablet Take 1 tablet (500 mg total) by mouth 2 (two) times daily. (Patient not taking: Reported on 07/31/2019) 14 tablet 0   No Known Allergies  I have reviewed patient's Past Medical Hx, Surgical Hx, Family Hx, Social Hx, medications and allergies.   ROS:  Review of Systems  Constitutional: Negative for chills and fever.  Gastrointestinal: Positive for abdominal pain. Negative for constipation, diarrhea, nausea and vomiting.  Genitourinary: Positive for pelvic pain and vaginal bleeding. Negative for dysuria and vaginal discharge.  Neurological: Negative for headaches.   Review of Systems  Other systems negative   Physical Exam  Physical Exam Patient Vitals for the past 24 hrs:  BP Temp Pulse Resp Height Weight  08/09/19 2344 139/86 -- 74 -- -- --  08/09/19 2341 -- 98.4 F (36.9 C) -- 18 _0  (1.575 m) 98.9 kg   Constitutional: Well-developed, well-nourished female in no acute distress.  Cardiovascular: normal rate Respiratory: normal effort GI: Abd soft, non-tender. Pos BS x 4 MS: Extremities nontender, no edema, normal ROM Neurologic: Alert and oriented x 4.  GU: Neg CVAT.  PELVIC EXAM: Cervix pink, visually closed, without lesion, small amount bloody discharge, vaginal walls and external genitalia normal Bimanual exam: Cervix 0/long/high, firm, anterior, neg CMT, uterus nontender, nonenlarged, adnexa without tenderness, enlargement, or mass   LAB  RESULTS Results for orders placed or performed during the hospital encounter of 08/09/19 (from the past 24 hour(s))  Urinalysis, Routine w reflex microscopic     Status: Abnormal   Collection Time: 08/10/19 12:01 AM  Result Value Ref Range   Color, Urine STRAW (A) YELLOW   APPearance CLEAR CLEAR   Specific Gravity, Urine 1.011 1.005 - 1.030   pH 7.0 5.0 - 8.0   Glucose, UA NEGATIVE NEGATIVE mg/dL   Hgb urine dipstick LARGE (A) NEGATIVE   Bilirubin Urine NEGATIVE NEGATIVE   Ketones, ur NEGATIVE NEGATIVE mg/dL   Protein, ur NEGATIVE NEGATIVE mg/dL   Nitrite NEGATIVE NEGATIVE   Leukocytes,Ua NEGATIVE NEGATIVE   RBC / HPF 0-5 0 - 5 RBC/hpf   WBC, UA 0-5 0 - 5 WBC/hpf   Bacteria, UA NONE SEEN NONE SEEN   Squamous Epithelial / LPF 0-5 0 - 5   Mucus PRESENT   Wet prep, genital     Status: Abnormal   Collection Time: 08/10/19 12:01 AM   Specimen: Urine, Clean Catch  Result Value Ref Range   Yeast Wet Prep HPF POC PRESENT (A) NONE SEEN   Trich, Wet Prep PRESENT (A) NONE SEEN   Clue Cells Wet Prep HPF POC NONE SEEN NONE SEEN   WBC, Wet Prep HPF POC MANY (A) NONE SEEN   Sperm NONE SEEN   CBC     Status: Abnormal   Collection Time: 08/10/19 12:10 AM  Result Value Ref Range   WBC 11.6 (H) 4.0 - 10.5 K/uL   RBC 4.34 3.87 - 5.11 MIL/uL   Hemoglobin 11.4 (L) 12.0 - 15.0 g/dL   HCT 35.0 (L) 36.0 - 46.0 %   MCV 80.6 80.0 - 100.0 fL   MCH 26.3 26.0 - 34.0 pg   MCHC 32.6 30.0 - 36.0 g/dL   RDW 15.1 11.5 - 15.5 %   Platelets 422 (H) 150 - 400 K/uL   nRBC 0.0 0.0 - 0.2 %  hCG, quantitative, pregnancy     Status: Abnormal   Collection Time: 08/10/19 12:10 AM  Result Value Ref Range   hCG, Beta Chain, Quant, S 8,934 (H) <5 mIU/mL  ABO/Rh     Status: None   Collection Time: 08/10/19 12:10 AM  Result Value Ref Range   ABO/RH(D) O POS    No rh immune globuloin      NOT A RH IMMUNE GLOBULIN CANDIDATE, PT RH POSITIVE Performed at [facility], [address] [place], [place]  [address]     --/--/O POS (01/21 0010)  IMAGING US OB Comp Less 14 Wks  Result Date: 08/10/2019 CLINICAL DATA:  Vaginal bleeding EXAM: OBSTETRIC <14 WK [place] AND TRANSVAGINAL OB US TECHNIQUE: Both transabdominal and transvaginal ultrasound examinations were performed for complete evaluation of the gestation as well as the maternal uterus, adnexal regions, and pelvic cul-de-sac. Transvaginal technique was performed to assess early pregnancy. COMPARISON:  None. FINDINGS: Intrauterine gestational sac: Single Yolk sac:  Not Visualized. Embryo:  Not Visualized. Cardiac Activity: Not Visualized. MSD: 9.51 mm   5 w   5 d Subchorionic hemorrhage:  None visualized. Maternal uterus/adnexae: The right ovary measures 4.1 x 2 x 1.5 cm. The left ovary measures 3.1 x 2.1 x 1.8 cm. Two fibroids are noted measuring up to approximately 3.9 cm. There is a small amount of free fluid in the posterior cul-de-sac. IMPRESSION: 1. Probable early intrauterine gestational sac, but no yolk sac, fetal pole, or cardiac activity yet visualized. Recommend follow-up quantitative B-HCG levels and follow-up US in 14 days to assess viability. This recommendation follows SRU consensus guidelines: Diagnostic Criteria for Nonviable Pregnancy Early in the First Trimester. [place] [employer] Med 2013; 244:6286-38. 2. Small amount of free fluid in the patient's pelvis. 3. Uterine fibroids are noted as detailed above. Electronically Signed   By: Constance Holster M.D.   On: 08/10/2019 01:16   US OB Transvaginal  Result Date: 08/10/2019 CLINICAL DATA:  Vaginal bleeding EXAM: OBSTETRIC <14 WK [place] AND TRANSVAGINAL OB US TECHNIQUE: Both transabdominal and transvaginal ultrasound examinations were performed for complete evaluation of the gestation as well as the maternal uterus, adnexal regions, and pelvic cul-de-sac. Transvaginal technique was performed to assess early pregnancy. COMPARISON:  None. FINDINGS: Intrauterine gestational sac: Single Yolk sac:  Not  Visualized. Embryo:  Not Visualized. Cardiac Activity: Not Visualized. MSD: 9.51 mm   5 w   5 d Subchorionic hemorrhage:  None visualized. Maternal uterus/adnexae: The right ovary measures 4.1 x 2 x 1.5 cm. The left ovary measures 3.1 x 2.1 x  1.8 cm. Two fibroids are noted measuring up to approximately 3.9 cm. There is a small amount of free fluid in the posterior cul-de-sac. IMPRESSION: 1. Probable early intrauterine gestational sac, but no yolk sac, fetal pole, or cardiac activity yet visualized. Recommend follow-up quantitative B-HCG levels and follow-up US in 14 days to assess viability. This recommendation follows SRU consensus guidelines: Diagnostic Criteria for Nonviable Pregnancy Early in the First Trimester. [employer] Med 2013; 301:4159-73. 2. Small amount of free fluid in the patient's pelvis. 3. Uterine fibroids are noted as detailed above. Electronically Signed   By: Constance Holster M.D.   On: 08/10/2019 01:16    MAU Management/MDM: Ordered usual first trimester r/o ectopic labs.   Pelvic exam and cultures done Will check baseline Ultrasound to rule out ectopic.  This bleeding/pain can represent a normal pregnancy with bleeding, spontaneous abortion or even an ectopic which can be life-threatening.  The process as listed above helps to determine which of these is present.  Reviewed findings with patient.  Will repeat US 7-10 days  Reviewed + Trichomonas on wet prep Given Flagyl 2gm po x 1 Partner will need treatment Pelvic rest    ASSESSMENT 1. Pregnancy of unknown anatomic location   2. Vaginal bleeding in pregnancy, first trimester   3. Pregnancy of unknown anatomic location   4. Vaginal bleeding in pregnancy, first trimester     PLAN Discharge home Will repeat  Ultrasound in about 7-10 days  Ectopic precautions   Pt stable at time of discharge. Encouraged to return here or to other Urgent Care/ED if she develops worsening of symptoms, increase in pain, fever, or other  concerning symptoms.    Hansel Feinstein CNM, MSN Certified Nurse-Midwife 08/10/2019  1:22 AM

## 2019-08-11 ENCOUNTER — Telehealth: Payer: Self-pay | Admitting: *Deleted

## 2019-08-11 ENCOUNTER — Other Ambulatory Visit: Payer: Self-pay

## 2019-08-11 ENCOUNTER — Inpatient Hospital Stay (HOSPITAL_COMMUNITY)
Admission: AD | Admit: 2019-08-11 | Discharge: 2019-08-11 | Disposition: A | Discharge status: Home / Self Care | Payer: Managed Care, Other (non HMO) | Attending: Obstetrics and Gynecology | Admitting: Obstetrics and Gynecology

## 2019-08-11 DIAGNOSIS — Z3A01 Less than 8 weeks gestation of pregnancy: Secondary | ICD-10-CM | POA: Insufficient documentation

## 2019-08-11 DIAGNOSIS — O209 Hemorrhage in early pregnancy, unspecified: Secondary | ICD-10-CM | POA: Insufficient documentation

## 2019-08-11 NOTE — MAU Note (Signed)
Had some bleeding going on. Was seen  Yesterday here yesterday for bleeding.  Was told to come back if things changed.  Saw a line of red mucous, so thought she would come in. No pain today. ([place] off by couple wks, Gest sac only).

## 2019-08-11 NOTE — Telephone Encounter (Signed)
Pt called to office stating she is still having some bleeding in pregnancy and is concerned. Pt was just seen at hospital yesterday, had u/s and labs. Pt states she has continued to spot and has had some thicker discharge with bleeding.  Pt advised to return to hospital as she can best be evaluated there.   Pt states understanding.

## 2019-08-12 ENCOUNTER — Other Ambulatory Visit: Payer: Self-pay

## 2019-08-12 ENCOUNTER — Other Ambulatory Visit: Payer: Self-pay | Admitting: Student

## 2019-08-12 ENCOUNTER — Inpatient Hospital Stay (HOSPITAL_COMMUNITY)
Admission: AD | Admit: 2019-08-12 | Discharge: 2019-08-12 | Disposition: A | Discharge status: Home / Self Care | Payer: Managed Care, Other (non HMO) | Attending: Obstetrics & Gynecology | Admitting: Obstetrics & Gynecology

## 2019-08-12 DIAGNOSIS — Z3A08 8 weeks gestation of pregnancy: Secondary | ICD-10-CM | POA: Insufficient documentation

## 2019-08-12 DIAGNOSIS — O26851 Spotting complicating pregnancy, first trimester: Secondary | ICD-10-CM | POA: Insufficient documentation

## 2019-08-12 DIAGNOSIS — O00109 Unspecified tubal pregnancy without intrauterine pregnancy: Secondary | ICD-10-CM

## 2019-08-12 DIAGNOSIS — O209 Hemorrhage in early pregnancy, unspecified: Secondary | ICD-10-CM

## 2019-08-12 DIAGNOSIS — O009 Unspecified ectopic pregnancy without intrauterine pregnancy: Secondary | ICD-10-CM

## 2019-08-12 LAB — COMPREHENSIVE METABOLIC PANEL
ALT: 47 U/L — ABNORMAL HIGH (ref 0–44)
AST: 44 U/L — ABNORMAL HIGH (ref 15–41)
Albumin: 3.7 g/dL (ref 3.5–5.0)
Alkaline Phosphatase: 52 U/L (ref 38–126)
Anion gap: 8 (ref 5–15)
BUN: 9 mg/dL (ref 6–20)
CO2: 26 mmol/L (ref 22–32)
Calcium: 9.1 mg/dL (ref 8.9–10.3)
Chloride: 104 mmol/L (ref 98–111)
Creatinine, Ser: 0.64 mg/dL (ref 0.44–1.00)
GFR calc Af Amer: >60 mL/min (ref >60)
GFR calc non Af Amer: >60 mL/min (ref >60)
Glucose, Bld: 95 mg/dL (ref 70–99)
Potassium: 3.4 mmol/L — ABNORMAL LOW (ref 3.5–5.1)
Sodium: 138 mmol/L (ref 135–145)
Total Bilirubin: 0.5 mg/dL (ref 0.3–1.2)
Total Protein: 6.8 g/dL (ref 6.5–8.1)

## 2019-08-12 LAB — CBC
HCT: 35.9 % — ABNORMAL LOW (ref 36.0–46.0)
Hemoglobin: 11.7 g/dL — ABNORMAL LOW (ref 12.0–15.0)
MCH: 26.2 pg (ref 26.0–34.0)
MCHC: 32.6 g/dL (ref 30.0–36.0)
MCV: 80.3 fL (ref 80.0–100.0)
Platelets: 452 10*3/uL — ABNORMAL HIGH (ref 150–400)
RBC: 4.47 MIL/uL (ref 3.87–5.11)
RDW: 15.2 % (ref 11.5–15.5)
WBC: 10.8 10*3/uL — ABNORMAL HIGH (ref 4.0–10.5)
nRBC: 0 % (ref 0.0–0.2)

## 2019-08-12 LAB — HCG, QUANTITATIVE, PREGNANCY: hCG, Beta Chain, Quant, S: 7827 m[IU]/mL — ABNORMAL HIGH (ref <5)

## 2019-08-12 MED ORDER — METHOTREXATE FOR ECTOPIC PREGNANCY
50.0000 mg/m2 | Freq: Once | INTRAMUSCULAR | Status: AC
  Administered 2019-08-12: 105 mg via INTRAMUSCULAR
  Filled 2019-08-12: qty 1

## 2019-08-12 NOTE — MAU Note (Signed)
Pt to wait in the lobby for results, CNM will see patient once labs are back. Informed pt to not leave hospital grounds, verbalizes understanding.

## 2019-08-12 NOTE — MAU Note (Cosign Needed)
Patient Sally Taylor is a 29 y.o. G1P0000  at [redacted]w[redacted]d by LMP here for follow-up bHCG. She denies any pain; is still having some light spotting.  She was diagnosed with a pregnancy of unknown location on 1-21; her beta that day was 8900. Beta today was 7900; inappropriate drop in quant. Discussed with Dr. Kennon Rounds, who recommends methotrexate for treatment of suspected ectopic.  Patient amenable; will check LFTs before injection and set up follow up visit on Day 4 (Tuesday) and Friday (Day 7).    Patient tolerated injection well; follow up appt was made for patient for 9am on 08/15/2019 for follow up quant (Day 4)  and patient will come back to MAU for follow up quant on 1/29 (due to work schedule).   Strict ectopic precautions reviewed, including when to return to MAU.   Maye Hides

## 2019-08-12 NOTE — Discharge Instructions (Signed)
Keep appt on 1/26 at 9 am; come to MAU early on Friday at 1/29.    Methotrexate Treatment for an Ectopic Pregnancy  Methotrexate is a medicine that treats an ectopic pregnancy. An ectopic pregnancy is a pregnancy in which the fetus develops outside the uterus. This kind of pregnancy can be dangerous. Methotrexate works by stopping the growth of the fertilized egg. It also helps your body absorb tissue from the egg. This takes between 2-6 weeks. Most ectopic pregnancies can be successfully treated with methotrexate if they are diagnosed early. Tell a health care provider about:  Any allergies you have.  All medicines you are taking, including vitamins, herbs, eye drops, creams, and over-the-counter medicines.  Any medical conditions you have. What are the risks? Generally, this is a safe treatment. However, problems may occur, including:  Nausea or vomiting or both.  Vaginal bleeding or spotting.  Diarrhea.  Abdominal cramping.  Dizziness or feeling lightheaded.  Mouth sores.  Swelling or irritation of the lining of your lungs (pneumonitis).  Liver damage.  Hair loss. There is a risk that methotrexate treatment will fail and your pregnancy will continue. There is also a risk that the ectopic pregnancy might rupture while you are using this medicine. What happens before the procedure?  Liver tests, kidney tests, and a complete blood test will be done.  Blood tests will be done to measure the pregnancy hormone levels and to determine your blood type.  If you are Rh-negative and the father is Rh-positive or his Rh type is not known, you will be given a Rho (D) immune globulin shot. What happens during the procedure? Your health care provider may give you methotrexate by injection or in the form of a pill. Methotrexate may be given as a single dose of medicine or a series of doses, depending on your response to the treatment.  Methotrexate injections will be given by your  health care provider. This is the most common way that methotrexate is used to treat an ectopic pregnancy.  If you are prescribed oral methotrexate, it is very important that you follow your health care provider's instructions on how to take oral methotrexate. Additional medicines may be needed to manage an ectopic pregnancy. The procedure may vary among health care providers and hospitals. What happens after the procedure?  You may have abdominal cramping, vaginal bleeding, and fatigue.  Blood tests will be taken at timed intervals for several days or weeks to check your pregnancy hormone levels. The blood tests will be done until the pregnancy hormone can no longer be detected in the blood.  You may need to have a surgical procedure to remove the ectopic pregnancy if methotrexate treatment fails.  Follow instructions from your health care provider on how and when to report any symptoms that may indicate a ruptured ectopic pregnancy. Summary  Methotrexate is a medicine that treats an ectopic pregnancy.  Methotrexate may be given in a single dose or a series of doses over time.  Blood tests will be taken at timed intervals for several days or weeks to check your pregnancy hormone levels. The blood tests will be done until no more pregnancy hormone is detected in the blood.  There is a risk that methotrexate treatment will fail and your pregnancy will continue. There is also a risk that the ectopic pregnancy might rupture while you are using this medicine. This information is not intended to replace advice given to you by your health care provider. Make sure you  discuss any questions you have with your health care provider. Document Revised: 06/18/2017 Document Reviewed: 08/25/2016 Elsevier Patient Education  [place].

## 2019-08-12 NOTE — MAU Note (Cosign Needed)
Patient Sally Taylor is a 29 y.o. [id number] At uncertain gestation here after seeing a streak of red bloody mucous at home. She was seen in MAU on 1/20 and diagnosed with a pregnancy of unknown location, US showed gestational sac only 5 weeks 5 days,  and bHCG of 8934 (early morning of 1/21).  After her bleeding today, she came because she thought she "was supposed to". Other than her episode of bloody mucous at home, she denies any change in her symptoms.   Reviewed that she should return tomorrow (1/23) for follow up bHCG as it is too early to check her bHCG and without a concerning change in her symptoms, we don't recommend repeat US today. She says that she will come after work (at 3 pm) on 1/23. She understands the need for follow up and agrees to be available for results afterwards.   All questions answered; patient safe for discharge.  Maye Hides

## 2019-08-12 NOTE — MAU Note (Signed)
Sally Taylor is a 29 y.o. at [redacted]w[redacted]d here in MAU reporting: here for follow up hcg. Sees some brown spotting but no heavy bleeding. No pain.  Pain score: 0/10  Vitals:   08/12/19 1707  BP: 132/69  Pulse: 81  Resp: 16  Temp: 99.1 F (37.3 C)  SpO2: 100%      Lab orders placed from triage: hcg order released

## 2019-08-15 ENCOUNTER — Other Ambulatory Visit: Payer: Self-pay

## 2019-08-15 ENCOUNTER — Ambulatory Visit (INDEPENDENT_AMBULATORY_CARE_PROVIDER_SITE_OTHER): Payer: Self-pay

## 2019-08-15 DIAGNOSIS — O3680X0 Pregnancy with inconclusive fetal viability, not applicable or unspecified: Secondary | ICD-10-CM

## 2019-08-15 LAB — BETA HCG QUANT (REF LAB): hCG Quant: 5122 m[IU]/mL

## 2019-08-15 NOTE — Progress Notes (Signed)
Pt here today on day 4 following methotrexate given on 08/12/19 for ectopic pregnancy. Pt denies any pain. Reports bleeding since mtx admin, bleeding has slowed to spotting at this time. Explained pt should still return to MAU for any severe pain or heavy bleeding. Lab drawn. Explained I will call with results and continued plan of care in about 2 hours.  Beta HCG level today is 5122, decreased from 7827 on 08/12/19. Result and pt's recent hx reviewed with Marcille Buffy, CNM. She states this is an appropriate decrease and pt should follow up for beta HCG on day 7.   Called pt. Reviewed results and provider recommendation. Patient states she can come to the office Friday AM for lab draw. Explained to pt that if she is not able to make the appt it is very important she go to MAU for her lab work at some point on Friday. Pt verbalizes understanding. Ectopic precautions reviewed. [place] office notified to schedule pt for stat beta HCG visit on 08/18/19 AM.  Apolonio Schneiders RN 08/15/19

## 2019-08-16 ENCOUNTER — Encounter: Payer: Self-pay | Admitting: Medical

## 2019-08-16 ENCOUNTER — Ambulatory Visit (HOSPITAL_COMMUNITY)
Admission: RE | Admit: 2019-08-16 | Discharge: 2019-08-16 | Disposition: A | Discharge status: Home / Self Care | Payer: Self-pay | Source: Ambulatory Visit | Attending: Advanced Practice Midwife | Admitting: Advanced Practice Midwife

## 2019-08-16 ENCOUNTER — Ambulatory Visit: Payer: Medicaid Other | Admitting: Medical

## 2019-08-16 DIAGNOSIS — O009 Unspecified ectopic pregnancy without intrauterine pregnancy: Secondary | ICD-10-CM

## 2019-08-16 DIAGNOSIS — O209 Hemorrhage in early pregnancy, unspecified: Secondary | ICD-10-CM | POA: Insufficient documentation

## 2019-08-16 DIAGNOSIS — O23591 Infection of other part of genital tract in pregnancy, first trimester: Secondary | ICD-10-CM | POA: Insufficient documentation

## 2019-08-16 DIAGNOSIS — O3680X0 Pregnancy with inconclusive fetal viability, not applicable or unspecified: Secondary | ICD-10-CM | POA: Insufficient documentation

## 2019-08-16 DIAGNOSIS — A5901 Trichomonal vulvovaginitis: Secondary | ICD-10-CM | POA: Insufficient documentation

## 2019-08-16 NOTE — Progress Notes (Signed)
Pt here today for [place] results.  Per chart review pt did not need to get OB US due to pt starting first dose of MTX tx for ectopic pregnancy.   Pt has an appt scheduled for day 7 Stat Beta lab for follow up.  Pt verbalized understanding with no further questions.    Mel Almond, RN 08/16/19

## 2019-08-16 NOTE — Progress Notes (Signed)
Patient seen and assessed by nursing staff during this encounter. I have reviewed the chart and agree with the documentation and plan.  Marcille Buffy DNP, CNM  08/16/19  1:12 PM

## 2019-08-16 NOTE — Patient Instructions (Signed)
Ectopic Pregnancy ° °An ectopic pregnancy happens when a fertilized egg grows outside the womb (uterus). The fertilized egg cannot stay alive outside of the womb. This problem often happens in a fallopian tube. It is often caused by damage to the tube. °If this problem is found early, you may be treated with medicine that stops the egg from growing. If your tube tears or bursts open (ruptures), you will bleed inside. Often, there is very bad pain in the lower belly. This is an emergency. You will need surgery. Get help right away. °Follow these instructions at home: °After being treated with medicine or surgery: °· Rest and limit your activity for as long as told by your doctor. °· Until your doctor says that it is safe: °? Do not lift anything that is heavier than 10 lb (4.5 kg) or the limit that your doctor tells you. °? Avoid exercise and any movement that takes a lot of effort. °· To prevent problems when pooping (constipation): °? Eat a healthy diet. This includes: °§ Fruits. °§ Vegetables. °§ Whole grains. °? Drink 6-8 glasses of water a day. °Contact a doctor if: °Get help right away if: °· You have sudden and very bad pain in your belly. °· You have very bad pain in your shoulders or neck. °· You have pain that gets worse and is not helped by medicine. °· You have: °? A fever or chills. °? Vaginal bleeding. °? Redness or swelling at the site of a surgical cut (incision). °· You feel sick to your stomach (nauseous) or you throw up (vomit). °· You feel dizzy or weak. °· You feel light-headed or you pass out (faint). °Summary °· An ectopic pregnancy happens when a fertilized egg grows outside the womb (uterus). °· If this problem is found early, you may be treated with medicine that stops the egg from growing. °· If your tube tears or bursts open (ruptures), you will need surgery. This is an emergency. Get help right away. °This information is not intended to replace advice given to you by your health care  provider. Make sure you discuss any questions you have with your health care provider. °Document Revised: 06/18/2017 Document Reviewed: 07/30/2016 °Elsevier Patient Education © 2020 Elsevier Inc. ° °

## 2019-08-16 NOTE — Progress Notes (Signed)
Patient seen and assessed by nursing staff during this encounter. I have reviewed the chart and agree with the documentation and plan.  Kerry Hough, PA-C 08/16/2019 3:55 PM

## 2019-08-17 ENCOUNTER — Inpatient Hospital Stay (HOSPITAL_COMMUNITY)
Admission: AD | Admit: 2019-08-17 | Discharge: 2019-08-17 | Disposition: A | Discharge status: Home / Self Care | Payer: Medicaid Other | Attending: Obstetrics & Gynecology | Admitting: Obstetrics & Gynecology

## 2019-08-17 ENCOUNTER — Other Ambulatory Visit: Payer: Self-pay

## 2019-08-17 ENCOUNTER — Encounter (HOSPITAL_COMMUNITY): Payer: Self-pay | Admitting: Obstetrics & Gynecology

## 2019-08-17 DIAGNOSIS — R109 Unspecified abdominal pain: Secondary | ICD-10-CM | POA: Insufficient documentation

## 2019-08-17 DIAGNOSIS — O209 Hemorrhage in early pregnancy, unspecified: Secondary | ICD-10-CM

## 2019-08-17 DIAGNOSIS — O0281 Inappropriate change in quantitative human chorionic gonadotropin (hCG) in early pregnancy: Secondary | ICD-10-CM | POA: Insufficient documentation

## 2019-08-17 DIAGNOSIS — O26891 Other specified pregnancy related conditions, first trimester: Secondary | ICD-10-CM | POA: Insufficient documentation

## 2019-08-17 DIAGNOSIS — Z3A09 9 weeks gestation of pregnancy: Secondary | ICD-10-CM | POA: Insufficient documentation

## 2019-08-17 LAB — HCG, QUANTITATIVE, PREGNANCY: hCG, Beta Chain, Quant, S: 3251 m[IU]/mL — ABNORMAL HIGH (ref <5)

## 2019-08-17 NOTE — MAU Note (Addendum)
Day 6 post MTX.  Started having bad cramps this morning at work, had to leave.  Has eased off now. Was worse then cramps normally are with cycle, so thought she needed to get checked out

## 2019-08-17 NOTE — MAU Provider Note (Signed)
History     CSN: [id number]  Arrival date and time: 08/17/19 1342   First Provider Initiated Contact with Patient 08/17/19 1423      Chief Complaint  Patient presents with  . Vaginal Bleeding  . Abdominal Pain   HPI Sally Taylor is a 29 y.o. G1P0010 patient who is s/p Methotrexate administration on 08/12/2019. She presents to MAU for evaluation of vaginal bleeding and abdominal cramping 6/10 earlier today. She took pain medicine yesterday but has not taken anything today. She denies abdominal tenderness, weakness, syncope, recurrent heavy vaginal bleeding, fever or illness.  Patient states she has an appointment in clinic tomorrow for Day 7 labs and will not be able to make that appointment due to her work schedule. She planned to present to MAU tomorrow night for Day 7 labs but was concerned about her bleeding today and was worried about waiting for tomorrow.  Patient requests work note for today and tomorrow.  OB History    Gravida  1   Para  0   Term  0   Preterm  0   AB  0   Living  0     SAB  0   TAB  0   Ectopic  0   Multiple  0   Live Births  0           Past Medical History:  Diagnosis Date  . Asthma   . Headache   . Hypertension     Past Surgical History:  Procedure Laterality Date  . LAPAROSCOPIC APPENDECTOMY N/A 04/01/2017   Procedure: APPENDECTOMY LAPAROSCOPIC;  Surgeon: Johnathan Hausen, MD;  Location: [facility];  Service: General;  Laterality: N/A;    Family History  Problem Relation Age of Onset  . Hypertension Mother     Social History   Tobacco Use  . Smoking status: Never Smoker  . Smokeless tobacco: Never Used  Substance Use Topics  . Alcohol use: Not Currently    Comment: occ  . Drug use: No    Allergies: No Known Allergies  Medications Prior to Admission  Medication Sig Dispense Refill Last Dose  . acetaminophen (TYLENOL) 325 MG tablet Take 1 tablet (325 mg total) by mouth every 6 (six) hours as  needed for mild pain, moderate pain or headache. (Patient not taking: Reported on 08/22/2018)     . Blood Pressure Monitoring (BLOOD PRESSURE KIT) DEVI 1 kit by Does not apply route once a week. Check Blood Pressure regularly and record readings into the [employer] App.  Large Cuff.  DX O90.0 1 each 0   . Prenatal Vit-Fe Fumarate-FA (PRENATAL MULTIVITAMIN) TABS tablet Take 1 tablet by mouth daily at 12 noon.       Review of Systems  Gastrointestinal: Positive for abdominal pain.  Genitourinary: Positive for vaginal bleeding.  Musculoskeletal: Negative for back pain.  All other systems reviewed and are negative.  Physical Exam   Blood pressure 136/81, pulse 70, temperature 98.5 F (36.9 C), temperature source Oral, resp. rate 18, last menstrual period 06/15/2019, SpO2 100 %.  Physical Exam  Nursing note and vitals reviewed. Constitutional: She is oriented to person, place, and time. She appears well-developed and well-nourished.  Cardiovascular: Normal rate.  Respiratory: Effort normal and breath sounds normal.  GI: Soft. She exhibits no distension. There is no abdominal tenderness. There is no rebound and no guarding.  Neurological: She is alert and oriented to person, place, and time.  Skin: Skin is warm and dry.  Psychiatric:  She has a normal mood and affect. Her behavior is normal. Judgment and thought content normal.    MAU Course/MDM  Procedures  --S/p MTX 08/12/2019 for inappropriate rise in quant hCG and concern for ectopic --IUGS visualized 08/10/2019. S/p ultrasound yesterday 08/16/2019, no IUGS visualized --VSS, bleeding appropriate --Appropriate decline 5122 on Day 4 (08/14/2018) and today 3251 which is Day 6   Patient Vitals for the past 24 hrs:  BP Temp Temp src Pulse Resp SpO2  08/17/19 1417 136/81 -- -- 70 -- --  08/17/19 1400 (!) 149/95 98.5 F (36.9 C) Oral 80 18 100 %   Assessment and Plan  --29 y.o. G1P0000 at [redacted]w[redacted]d --Appropriate decline in quant hCG  following Methotrexate --Hemodynamically stable --Discharge home in stable condition with bleeding precautions  F/U: Patient to make appointment for Day 14 labs per protocol  Patient stated she was unable to stay to wait for results. She was called at home at 1608, patient identifier x 2 confirmed, results discussed, plan for follow up reviewed with teachback from patient  SDarlina Rumpf CNM 08/17/2019, 4:25 PM

## 2019-08-17 NOTE — MAU Note (Signed)
Pt informed CNM, here mainly for blood work, can't go to clinic for day 7 draw tomorrow.  Pt will leave after draw.  CNM will call with results.

## 2019-08-17 NOTE — Discharge Instructions (Signed)
Methotrexate Treatment for an Ectopic Pregnancy, Care After This sheet gives you information about how to care for yourself after your procedure. Your health care provider may also give you more specific instructions. If you have problems or questions, contact your health care provider. What can I expect after the procedure? After the procedure, it is common to have:  Abdominal cramping.  Vaginal bleeding.  Fatigue.  Nausea.  Vomiting.  Diarrhea. Blood tests will be taken at timed intervals for several days or weeks to check your pregnancy hormone levels. The blood tests will be done until the pregnancy hormone can no longer be detected in the blood. Follow these instructions at home: Activity  Do not have sex until your health care provider approves.  Limit activities that take a lot of effort as told by your health care provider. Medicines  Take over the counter and prescription medicines only as told by your health care provider.  Do not take aspirin, ibuprofen, naproxen, or any other NSAIDs.  Do not take folic acid, prenatal vitamins, or other vitamins that contain folic acid. General instructions   Do not drink alcohol.  Follow instructions from your health care provider on how and when to report any symptoms that may indicate a ruptured ectopic pregnancy.  Keep all follow-up visits as told by your health care provider. This is important. Contact a health care provider if:  You have persistent nausea and vomiting.  You have persistent diarrhea.  You are having a reaction to the medicine, such as: ? Tiredness. ? Skin rash. ? Hair loss. Get help right away if:  Your abdominal or pelvic pain gets worse.  You have more vaginal bleeding.  You feel light-headed or you faint.  You have shortness of breath.  Your heart rate increases.  You develop a cough.  You have chills.  You have a fever. Summary  After the procedure, it is common to have symptoms  of abdominal cramping, vaginal bleeding and fatigue. You may also experience other symptoms.  Blood tests will be taken at timed intervals for several days or weeks to check your pregnancy hormone levels. The blood tests will be done until the pregnancy hormone can no longer be detected in the blood.  Limit strenuous activity as told by your health care provider.  Follow instructions from your health care provider on how and when to report any symptoms that may indicate a ruptured ectopic pregnancy. This information is not intended to replace advice given to you by your health care provider. Make sure you discuss any questions you have with your health care provider. Document Revised: 06/18/2017 Document Reviewed: 08/25/2016 Elsevier Patient Education  2020 Elsevier Inc.  

## 2019-08-18 ENCOUNTER — Ambulatory Visit: Payer: Medicaid Other

## 2019-08-28 ENCOUNTER — Other Ambulatory Visit: Payer: Self-pay

## 2019-08-28 ENCOUNTER — Ambulatory Visit (INDEPENDENT_AMBULATORY_CARE_PROVIDER_SITE_OTHER): Payer: Self-pay | Admitting: Advanced Practice Midwife

## 2019-08-28 ENCOUNTER — Encounter: Payer: Managed Care, Other (non HMO) | Admitting: Advanced Practice Midwife

## 2019-08-28 VITALS — BP 138/91 | HR 73 | Temp 98.5°F | Wt 209.5 lb

## 2019-08-28 DIAGNOSIS — O009 Unspecified ectopic pregnancy without intrauterine pregnancy: Secondary | ICD-10-CM

## 2019-08-28 DIAGNOSIS — O209 Hemorrhage in early pregnancy, unspecified: Secondary | ICD-10-CM

## 2019-08-28 DIAGNOSIS — Z8759 Personal history of other complications of pregnancy, childbirth and the puerperium: Secondary | ICD-10-CM | POA: Insufficient documentation

## 2019-08-28 NOTE — Progress Notes (Signed)
  GYNECOLOGY PROGRESS NOTE  History:  29 y.o. G1P0000 presents to [place] office today for problem gyn visit. She had methotrexate for suspected ectopic pregnancy on 08/12/19 and had drop in hcg in 1 week. She missed her appointment at [facility] for repeat hcg 1 week ago. She reports her pain and bleeding both stopped on 08/21/19 and she denies any problems today.   The following portions of the patient's history were reviewed and updated as appropriate: allergies, current medications, past family history, past medical history, past social history, past surgical history and problem list.   Review of Systems:  Pertinent items are noted in HPI.   Objective:  Physical Exam Blood pressure (!) 138/91, pulse 73, temperature 98.5 F (36.9 C), weight 209 lb 8 oz (95 kg), last menstrual period 06/15/2019. VS reviewed, nursing note reviewed,  Constitutional: well developed, well nourished, no distress HEENT: normocephalic CV: normal rate Pulm/chest wall: normal effort Breast Exam: deferred Abdomen: soft Neuro: alert and oriented x 3 Skin: warm, dry Psych: affect normal Pelvic exam: Deferred  Assessment & Plan:  1. Bleeding in early pregnancy   2. Ectopic pregnancy without intrauterine pregnancy, unspecified location --Treatment with Methotrexate on 08/12/19 with drop from 7827 to 3251 in 1 week. --Quant today and weekly until below 5 - Beta hCG quant (ref lab) - Beta hCG quant (ref lab); Future   Fatima Blank, CNM 2:59 PM

## 2019-08-28 NOTE — Progress Notes (Addendum)
GYN presents for FU, she had an ectopic pregnancy. She passed products of conception 08/16/2019. Stopped bleeding 08/21/2019.

## 2019-08-29 LAB — BETA HCG QUANT (REF LAB): hCG Quant: 12 m[IU]/mL

## 2019-09-04 ENCOUNTER — Other Ambulatory Visit: Payer: Self-pay

## 2019-09-04 ENCOUNTER — Other Ambulatory Visit: Payer: Medicaid Other

## 2019-09-04 DIAGNOSIS — O009 Unspecified ectopic pregnancy without intrauterine pregnancy: Secondary | ICD-10-CM

## 2019-09-05 LAB — BETA HCG QUANT (REF LAB): hCG Quant: 3 m[IU]/mL

## 2019-10-31 ENCOUNTER — Encounter: Payer: Self-pay | Admitting: Obstetrics

## 2019-10-31 ENCOUNTER — Other Ambulatory Visit: Payer: Self-pay

## 2019-10-31 ENCOUNTER — Ambulatory Visit (INDEPENDENT_AMBULATORY_CARE_PROVIDER_SITE_OTHER): Payer: Medicaid Other

## 2019-10-31 DIAGNOSIS — Z3201 Encounter for pregnancy test, result positive: Secondary | ICD-10-CM | POA: Diagnosis not present

## 2019-10-31 DIAGNOSIS — Z3200 Encounter for pregnancy test, result unknown: Secondary | ICD-10-CM

## 2019-10-31 DIAGNOSIS — O0990 Supervision of high risk pregnancy, unspecified, unspecified trimester: Secondary | ICD-10-CM | POA: Insufficient documentation

## 2019-10-31 DIAGNOSIS — Z3480 Encounter for supervision of other normal pregnancy, unspecified trimester: Secondary | ICD-10-CM | POA: Insufficient documentation

## 2019-10-31 LAB — POCT URINE PREGNANCY: Preg Test, Ur: POSITIVE — AB

## 2019-10-31 NOTE — Progress Notes (Signed)
Sally Taylor is here for UPT. UPT positive.  LMP       OB History  Gravida Para Term Preterm AB Living  1 0 0 0 0 0  SAB TAB Ectopic Multiple Live Births  0 0 0 0 0    # Outcome Date GA Lbr Len/2nd Weight Sex Delivery Anes PTL Lv  1 Current               Assessment   Normal pregnancy   Plan  Dating ultrasound ordered.  Return in 3 weeks for new ob visit.

## 2019-11-14 ENCOUNTER — Other Ambulatory Visit: Payer: Self-pay

## 2019-11-14 ENCOUNTER — Ambulatory Visit (HOSPITAL_COMMUNITY)
Admission: RE | Admit: 2019-11-14 | Discharge: 2019-11-14 | Disposition: A | Discharge status: Home / Self Care | Payer: Medicaid Other | Source: Ambulatory Visit | Attending: Obstetrics & Gynecology | Admitting: Obstetrics & Gynecology

## 2019-11-14 DIAGNOSIS — Z3480 Encounter for supervision of other normal pregnancy, unspecified trimester: Secondary | ICD-10-CM | POA: Insufficient documentation

## 2019-11-24 ENCOUNTER — Other Ambulatory Visit (HOSPITAL_COMMUNITY)
Admission: RE | Admit: 2019-11-24 | Discharge: 2019-11-24 | Disposition: A | Discharge status: Home / Self Care | Payer: Medicaid Other | Source: Ambulatory Visit | Attending: Family Medicine | Admitting: Family Medicine

## 2019-11-24 ENCOUNTER — Encounter: Payer: Self-pay | Admitting: Obstetrics

## 2019-11-24 ENCOUNTER — Other Ambulatory Visit: Payer: Self-pay

## 2019-11-24 ENCOUNTER — Ambulatory Visit (INDEPENDENT_AMBULATORY_CARE_PROVIDER_SITE_OTHER): Payer: Medicaid Other | Admitting: Family Medicine

## 2019-11-24 VITALS — BP 137/81 | HR 78 | Wt 203.6 lb

## 2019-11-24 DIAGNOSIS — Z3480 Encounter for supervision of other normal pregnancy, unspecified trimester: Secondary | ICD-10-CM | POA: Diagnosis present

## 2019-11-24 DIAGNOSIS — O99211 Obesity complicating pregnancy, first trimester: Secondary | ICD-10-CM

## 2019-11-24 DIAGNOSIS — O0911 Supervision of pregnancy with history of ectopic or molar pregnancy, first trimester: Secondary | ICD-10-CM

## 2019-11-24 DIAGNOSIS — D251 Intramural leiomyoma of uterus: Secondary | ICD-10-CM

## 2019-11-24 MED ORDER — PREPLUS 27-1 MG PO TABS: 1.0000 | ORAL_TABLET | Freq: Every day | ORAL | 13 refills | Status: DC

## 2019-11-24 MED ORDER — BLOOD PRESSURE KIT: 1.0000 | PACK | Freq: Every day | 0 refills | Status: DC

## 2019-11-24 MED ORDER — ASPIRIN EC 81 MG PO TBEC: 81.0000 mg | DELAYED_RELEASE_TABLET | Freq: Every day | ORAL | 2 refills | Status: DC

## 2019-11-24 NOTE — Patient Instructions (Signed)
First Trimester of Pregnancy  The first trimester of pregnancy is from week 1 until the end of week 13 (months 1 through 3). During this time, your baby will begin to develop inside you. At 6-8 weeks, the eyes and face are formed, and the heartbeat can be seen on ultrasound. At the end of 12 weeks, all the baby's organs are formed. Prenatal care is all the medical care you receive before the birth of your baby. Make sure you get good prenatal care and follow all of your doctor's instructions. Follow these instructions at home: Medicines  Take over-the-counter and prescription medicines only as told by your doctor. Some medicines are safe and some medicines are not safe during pregnancy.  Take a prenatal vitamin that contains at least 600 micrograms (mcg) of folic acid.  If you have trouble pooping (constipation), take medicine that will make your stool soft (stool softener) if your doctor approves. Eating and drinking   Eat regular, healthy meals.  Your doctor will tell you the amount of weight gain that is right for you.  Avoid raw meat and uncooked cheese.  If you feel sick to your stomach (nauseous) or throw up (vomit): ? Eat 4 or 5 small meals a day instead of 3 large meals. ? Try eating a few soda crackers. ? Drink liquids between meals instead of during meals.  To prevent constipation: ? Eat foods that are high in fiber, like fresh fruits and vegetables, whole grains, and beans. ? Drink enough fluids to keep your pee (urine) clear or pale yellow. Activity  Exercise only as told by your doctor. Stop exercising if you have cramps or pain in your lower belly (abdomen) or low back.  Do not exercise if it is too hot, too humid, or if you are in a place of great height (high altitude).  Try to avoid standing for long periods of time. Move your legs often if you must stand in one place for a long time.  Avoid heavy lifting.  Wear low-heeled shoes. Sit and stand up  straight.  You can have sex unless your doctor tells you not to. Relieving pain and discomfort  Wear a good support bra if your breasts are sore.  Take warm water baths (sitz baths) to soothe pain or discomfort caused by hemorrhoids. Use hemorrhoid cream if your doctor says it is okay.  Rest with your legs raised if you have leg cramps or low back pain.  If you have puffy, bulging veins (varicose veins) in your legs: ? Wear support hose or compression stockings as told by your doctor. ? Raise (elevate) your feet for 15 minutes, 3-4 times a day. ? Limit salt in your food. Prenatal care  Schedule your prenatal visits by the twelfth week of pregnancy.  Write down your questions. Take them to your prenatal visits.  Keep all your prenatal visits as told by your doctor. This is important. Safety  Wear your seat belt at all times when driving.  Make a list of emergency phone numbers. The list should include numbers for family, friends, the hospital, and police and fire departments. General instructions  Ask your doctor for a referral to a local prenatal class. Begin classes no later than at the start of month 6 of your pregnancy.  Ask for help if you need counseling or if you need help with nutrition. Your doctor can give you advice or tell you where to go for help.  Do not use hot tubs, steam   rooms, or saunas.  Do not douche or use tampons or scented sanitary pads.  Do not cross your legs for long periods of time.  Avoid all herbs and alcohol. Avoid drugs that are not approved by your doctor.  Do not use any tobacco products, including cigarettes, chewing tobacco, and electronic cigarettes. If you need help quitting, ask your doctor. You may get counseling or other support to help you quit.  Avoid cat litter boxes and soil used by cats. These carry germs that can cause birth defects in the baby and can cause a loss of your baby (miscarriage) or stillbirth.  Visit your dentist.  At home, brush your teeth with a soft toothbrush. Be gentle when you floss. Contact a doctor if:  You are dizzy.  You have mild cramps or pressure in your lower belly.  You have a nagging pain in your belly area.  You continue to feel sick to your stomach, you throw up, or you have watery poop (diarrhea).  You have a bad smelling fluid coming from your vagina.  You have pain when you pee (urinate).  You have increased puffiness (swelling) in your face, hands, legs, or ankles. Get help right away if:  You have a fever.  You are leaking fluid from your vagina.  You have spotting or bleeding from your vagina.  You have very bad belly cramping or pain.  You gain or lose weight rapidly.  You throw up blood. It may look like coffee grounds.  You are around people who have German measles, fifth disease, or chickenpox.  You have a very bad headache.  You have shortness of breath.  You have any kind of trauma, such as from a fall or a car accident. Summary  The first trimester of pregnancy is from week 1 until the end of week 13 (months 1 through 3).  To take care of yourself and your unborn baby, you will need to eat healthy meals, take medicines only if your doctor tells you to do so, and do activities that are safe for you and your baby.  Keep all follow-up visits as told by your doctor. This is important as your doctor will have to ensure that your baby is healthy and growing well. This information is not intended to replace advice given to you by your health care provider. Make sure you discuss any questions you have with your health care provider. Document Revised: 10/27/2018 Document Reviewed: 07/14/2016 Elsevier Patient Education  2020 Elsevier Inc.  

## 2019-11-24 NOTE — Progress Notes (Signed)
Patient presents for Initial OB visit. Patient has no concerns today.

## 2019-11-24 NOTE — Progress Notes (Signed)
History:   Sally Taylor is a 29 y.o. G2P0010 at 16w2dby 10 wk [place] seen today for her first obstetrical visit.  Her obstetrical history is significant for obesity. Patient does intend to breast feed. Pregnancy history fully reviewed.  Patient reports no complaints. Occasional nausea and vomiting but overall tolerating PO and declines medication.  Denies any PMH or medication use.  No family hx of congenital disorders or hearing loss.      HISTORY: OB History  Gravida Para Term Preterm AB Living  2 0 0 0 1 0  SAB TAB Ectopic Multiple Live Births  1 0 0 0 0    # Outcome Date GA Lbr Len/2nd Weight Sex Delivery Anes PTL Lv  2 Current           1 SAB             Last pap smear was done Feb 2020 and normal.   Past Medical History:  Diagnosis Date  . Asthma   . Headache   . Hypertension    Past Surgical History:  Procedure Laterality Date  . LAPAROSCOPIC APPENDECTOMY N/A 04/01/2017   Procedure: APPENDECTOMY LAPAROSCOPIC;  Surgeon: MJohnathan Hausen MD;  Location: [facility];  Service: General;  Laterality: N/A;   Family History  Problem Relation Age of Onset  . Hypertension Mother    Social History   Tobacco Use  . Smoking status: Never Smoker  . Smokeless tobacco: Never Used  Substance Use Topics  . Alcohol use: Not Currently    Comment: occ  . Drug use: No   No Known Allergies Current Outpatient Medications on File Prior to Visit  Medication Sig Dispense Refill  . acetaminophen (TYLENOL) 325 MG tablet Take 1 tablet (325 mg total) by mouth every 6 (six) hours as needed for mild pain, moderate pain or headache.     No current facility-administered medications on file prior to visit.    Review of Systems Pertinent items noted in HPI and remainder of comprehensive ROS otherwise negative. Physical Exam:   Vitals:   11/24/19 1047 11/24/19 1053  BP: (!) 158/92 137/81  Pulse: 76 78  Weight: 203 lb 9.6 oz (92.4 kg)    Fetal Heart Rate (bpm):  153 CONSTITUTIONAL: Well-developed, well-nourished female in no acute distress. Obese.   HEENT:  Normocephalic, atraumatic. External right and left ear normal. No scleral icterus.  NECK: Normal range of motion SKIN: No rash noted. Not diaphoretic. No erythema. No pallor. MUSCULOSKELETAL: Normal range of motion. No edema noted. NEUROLOGIC: Alert and oriented to person, place, and time. Normal muscle tone coordination. No cranial nerve deficit noted. PSYCHIATRIC: Normal mood and affect. Normal behavior. Normal judgment and thought content. CARDIOVASCULAR: Normal heart rate noted RESPIRATORY: Effort  normal, no problems with respiration noted ABDOMEN: Soft, non-tender. PELVIC: Deferred   Assessment:    Pregnancy: G2P0010 Patient Active Problem List   Diagnosis Date Noted  . Supervision of other normal pregnancy, antepartum 10/31/2019  . Ectopic pregnancy without intrauterine pregnancy 08/28/2019  . Appendicitis 04/01/2017     Plan:    [name] seen today for initial prenatal visit.  Diagnoses and all orders for this visit:  Supervision of other normal pregnancy, antepartum -     Blood Pressure KIT; 1 kit by Does not apply route daily. -     [place] OB COMP + 14 WK; Future -     Genetic Screening -     Culture, OB Urine -  Obstetric Panel, Including HIV -     Cervicovaginal ancillary only( [place]) -     Prenatal Vit-Fe Fumarate-FA (PREPLUS) 27-1 MG TABS; Take 1 tablet by mouth daily.  Pregnancy with history of ectopic pregnancy, first trimester       - Ectopic pregnancy treated with Methotrexate in Jan  Obesity affecting pregnancy in first trimester -     aspirin EC 81 MG tablet; Take 1 tablet (81 mg total) by mouth daily. Take after 12 weeks for prevention of preeclampsia later in pregnancy -     Hemoglobin A1c -     Protein / creatinine ratio, urine -     Comprehensive metabolic panel - BMI 37 - Initial BP elevated; repeat WNL; denies hx of HTN  Intramural  leiomyoma of uterus       - Noted on 10 wk [place]; asymptomatic prior to pregnancy   Initial labs drawn. Continue prenatal vitamins. Genetic Screening discussed; ordered. Ultrasound discussed; fetal anatomic survey: requested. Problem list reviewed and updated. The nature of [name] with multiple MDs and other Advanced Practice Providers was explained to patient; also emphasized that residents, students are part of our team. Routine obstetric precautions reviewed. Return in about 4 weeks (around 12/22/2019) for LOB; in-person for AFP.    Barrington Ellison, MD [facility] Family Medicine Fellow, [facility] for [employer], [place]

## 2019-11-25 LAB — OBSTETRIC PANEL, INCLUDING HIV
Antibody Screen: NEGATIVE
Basophils Absolute: 0 10*3/uL (ref 0.0–0.2)
Basos: 0 %
EOS (ABSOLUTE): 0 10*3/uL (ref 0.0–0.4)
Eos: 0 %
HIV Screen 4th Generation wRfx: NONREACTIVE
Hematocrit: 36.6 % (ref 34.0–46.6)
Hemoglobin: 12.1 g/dL (ref 11.1–15.9)
Hepatitis B Surface Ag: NEGATIVE
Immature Grans (Abs): 0 10*3/uL (ref 0.0–0.1)
Immature Granulocytes: 0 %
Lymphocytes Absolute: 1.6 10*3/uL (ref 0.7–3.1)
Lymphs: 20 %
MCH: 27.2 pg (ref 26.6–33.0)
MCHC: 33.1 g/dL (ref 31.5–35.7)
MCV: 82 fL (ref 79–97)
Monocytes Absolute: 0.5 10*3/uL (ref 0.1–0.9)
Monocytes: 6 %
Neutrophils Absolute: 5.7 10*3/uL (ref 1.4–7.0)
Neutrophils: 74 %
Platelets: 393 10*3/uL (ref 150–450)
RBC: 4.45 x10E6/uL (ref 3.77–5.28)
RDW: 14.4 % (ref 11.7–15.4)
RPR Ser Ql: NONREACTIVE
Rh Factor: POSITIVE
Rubella Antibodies, IGG: 4.2 index (ref >0.99)
WBC: 7.8 10*3/uL (ref 3.4–10.8)

## 2019-11-25 LAB — COMPREHENSIVE METABOLIC PANEL
ALT: 8 IU/L (ref 0–32)
AST: 12 IU/L (ref 0–40)
Albumin/Globulin Ratio: 1.6 (ref 1.2–2.2)
Albumin: 4.1 g/dL (ref 3.9–5.0)
Alkaline Phosphatase: 47 IU/L (ref 39–117)
BUN/Creatinine Ratio: 11 (ref 9–23)
BUN: 6 mg/dL (ref 6–20)
Bilirubin Total: 0.2 mg/dL (ref 0.0–1.2)
CO2: 23 mmol/L (ref 20–29)
Calcium: 9.3 mg/dL (ref 8.7–10.2)
Chloride: 102 mmol/L (ref 96–106)
Creatinine, Ser: 0.56 mg/dL — ABNORMAL LOW (ref 0.57–1.00)
GFR calc Af Amer: 147 mL/min/{1.73_m2} (ref >59)
GFR calc non Af Amer: 127 mL/min/{1.73_m2} (ref >59)
Globulin, Total: 2.5 g/dL (ref 1.5–4.5)
Glucose: 88 mg/dL (ref 65–99)
Potassium: 3.7 mmol/L (ref 3.5–5.2)
Sodium: 136 mmol/L (ref 134–144)
Total Protein: 6.6 g/dL (ref 6.0–8.5)

## 2019-11-25 LAB — PROTEIN / CREATININE RATIO, URINE
Creatinine, Urine: 155.5 mg/dL
Protein, Ur: 9.3 mg/dL
Protein/Creat Ratio: 60 mg/g creat (ref 0–200)

## 2019-11-25 LAB — HEMOGLOBIN A1C
Est. average glucose Bld gHb Est-mCnc: 103 mg/dL
Hgb A1c MFr Bld: 5.2 % (ref 4.8–5.6)

## 2019-11-26 LAB — URINE CULTURE, OB REFLEX

## 2019-11-26 LAB — CULTURE, OB URINE

## 2019-11-27 LAB — CERVICOVAGINAL ANCILLARY ONLY
Bacterial Vaginitis (gardnerella): POSITIVE — AB
Candida Glabrata: NEGATIVE
Candida Vaginitis: NEGATIVE
Chlamydia: NEGATIVE
Comment: NEGATIVE
Comment: NEGATIVE
Comment: NEGATIVE
Comment: NEGATIVE
Comment: NEGATIVE
Comment: NORMAL
Neisseria Gonorrhea: NEGATIVE
Trichomonas: NEGATIVE

## 2019-11-30 ENCOUNTER — Encounter: Payer: Self-pay | Admitting: Advanced Practice Midwife

## 2019-12-04 ENCOUNTER — Encounter: Payer: Self-pay | Admitting: Advanced Practice Midwife

## 2019-12-05 ENCOUNTER — Encounter: Payer: Self-pay | Admitting: Advanced Practice Midwife

## 2019-12-05 DIAGNOSIS — Z148 Genetic carrier of other disease: Secondary | ICD-10-CM | POA: Insufficient documentation

## 2019-12-22 ENCOUNTER — Ambulatory Visit (INDEPENDENT_AMBULATORY_CARE_PROVIDER_SITE_OTHER): Payer: Medicaid Other | Admitting: Advanced Practice Midwife

## 2019-12-22 ENCOUNTER — Other Ambulatory Visit (HOSPITAL_COMMUNITY)
Admission: RE | Admit: 2019-12-22 | Discharge: 2019-12-22 | Disposition: A | Discharge status: Home / Self Care | Payer: Medicaid Other | Source: Ambulatory Visit | Attending: Advanced Practice Midwife | Admitting: Advanced Practice Midwife

## 2019-12-22 ENCOUNTER — Other Ambulatory Visit: Payer: Self-pay

## 2019-12-22 VITALS — BP 134/82 | HR 81 | Wt 208.0 lb

## 2019-12-22 DIAGNOSIS — Z3480 Encounter for supervision of other normal pregnancy, unspecified trimester: Secondary | ICD-10-CM

## 2019-12-22 DIAGNOSIS — Z3A16 16 weeks gestation of pregnancy: Secondary | ICD-10-CM

## 2019-12-22 DIAGNOSIS — O139 Gestational [pregnancy-induced] hypertension without significant proteinuria, unspecified trimester: Secondary | ICD-10-CM

## 2019-12-22 DIAGNOSIS — N898 Other specified noninflammatory disorders of vagina: Secondary | ICD-10-CM | POA: Insufficient documentation

## 2019-12-22 DIAGNOSIS — R109 Unspecified abdominal pain: Secondary | ICD-10-CM

## 2019-12-22 DIAGNOSIS — O23592 Infection of other part of genital tract in pregnancy, second trimester: Secondary | ICD-10-CM

## 2019-12-22 DIAGNOSIS — O26892 Other specified pregnancy related conditions, second trimester: Secondary | ICD-10-CM

## 2019-12-22 DIAGNOSIS — B9689 Other specified bacterial agents as the cause of diseases classified elsewhere: Secondary | ICD-10-CM

## 2019-12-22 NOTE — Patient Instructions (Signed)

## 2019-12-22 NOTE — Progress Notes (Signed)
afpab

## 2019-12-22 NOTE — Progress Notes (Signed)
   PRENATAL VISIT NOTE  Subjective:  Sally Taylor is a 29 y.o. G2P0010 at [redacted]w[redacted]d being seen today for ongoing prenatal care.  She is currently monitored for the following issues for this low-risk pregnancy and has History of ectopic pregnancy; Supervision of other normal pregnancy, antepartum; Genetic carrier status; and Transient hypertension of pregnancy, antepartum on their problem list.  Patient reports occassional low abd/groin pain. None now.  Contractions: Not present. Vag. Bleeding: None.   . Denies leaking of fluid.   The following portions of the patient's history were reviewed and updated as appropriate: allergies, current medications, past family history, past medical history, past social history, past surgical history and problem list.   Objective:   Vitals:   12/22/19 1114  BP: 134/82  Pulse: 81  Weight: 208 lb (94.3 kg)    Fetal Status: Fetal Heart Rate (bpm): 150        FHR 150  General:  Alert, oriented and cooperative. Patient is in no acute distress.  Skin: Skin is warm and dry. No rash noted.   Cardiovascular: Normal heart rate noted  Respiratory: Normal respiratory effort, no problems with respiration noted  Abdomen: Soft, gravid, appropriate for gestational age.  Pain/Pressure: Present     Pelvic: Declined        Extremities: Normal range of motion.  Edema: None  Mental Status: Normal mood and affect. Normal behavior. Normal judgment and thought content.   Assessment and Plan:  Pregnancy: G2P0010 at [redacted]w[redacted]d 1. Vaginal odor - Cervicovaginal ancillary only  2. Supervision of other normal pregnancy, antepartum - Anatomy US - AFP, Serum, Open Spina Bifida  3. Abd pain pregnancy - Urine culture - Cervicovaginal ancillary only - Precautions and comfort measures reviewed  Preterm labor symptoms and general obstetric precautions including but not limited to vaginal bleeding, contractions, leaking of fluid and fetal movement were reviewed in  detail with the patient. Please refer to After Visit Summary for other counseling recommendations.   Return in about 4 weeks (around 01/19/2020) for Virtual Visit, ROB.  Future Appointments  Date Time Provider [place]  01/09/2020  9:45 AM [facility] US4 [facility]  01/23/2020 10:15 AM [place], Dan Europe, DO [facility] None    Manya Silvas, [place]

## 2019-12-24 LAB — AFP, SERUM, OPEN SPINA BIFIDA
AFP MoM: 2.84
AFP Value: 90.1 ng/mL
Gest. Age on Collection Date: 16.2 weeks
Maternal Age At EDD: 29.4 yr
OSBR Risk 1 IN: 309
Test Results:: POSITIVE — AB
Weight: 208 [lb_av]

## 2019-12-25 ENCOUNTER — Inpatient Hospital Stay (HOSPITAL_COMMUNITY)
Admission: AD | Admit: 2019-12-25 | Discharge: 2019-12-25 | Disposition: A | Discharge status: Home / Self Care | Payer: Medicaid Other | Attending: Obstetrics and Gynecology | Admitting: Obstetrics and Gynecology

## 2019-12-25 ENCOUNTER — Other Ambulatory Visit: Payer: Self-pay

## 2019-12-25 ENCOUNTER — Encounter (HOSPITAL_COMMUNITY): Payer: Self-pay | Admitting: Obstetrics and Gynecology

## 2019-12-25 DIAGNOSIS — O26892 Other specified pregnancy related conditions, second trimester: Secondary | ICD-10-CM | POA: Insufficient documentation

## 2019-12-25 DIAGNOSIS — Z79899 Other long term (current) drug therapy: Secondary | ICD-10-CM | POA: Diagnosis not present

## 2019-12-25 DIAGNOSIS — Z8249 Family history of ischemic heart disease and other diseases of the circulatory system: Secondary | ICD-10-CM | POA: Insufficient documentation

## 2019-12-25 DIAGNOSIS — O99512 Diseases of the respiratory system complicating pregnancy, second trimester: Secondary | ICD-10-CM | POA: Insufficient documentation

## 2019-12-25 DIAGNOSIS — Z7982 Long term (current) use of aspirin: Secondary | ICD-10-CM | POA: Insufficient documentation

## 2019-12-25 DIAGNOSIS — J45909 Unspecified asthma, uncomplicated: Secondary | ICD-10-CM | POA: Diagnosis not present

## 2019-12-25 DIAGNOSIS — R109 Unspecified abdominal pain: Secondary | ICD-10-CM | POA: Insufficient documentation

## 2019-12-25 DIAGNOSIS — O26899 Other specified pregnancy related conditions, unspecified trimester: Secondary | ICD-10-CM | POA: Diagnosis not present

## 2019-12-25 DIAGNOSIS — R030 Elevated blood-pressure reading, without diagnosis of hypertension: Secondary | ICD-10-CM

## 2019-12-25 DIAGNOSIS — Z3A16 16 weeks gestation of pregnancy: Secondary | ICD-10-CM

## 2019-12-25 DIAGNOSIS — O10912 Unspecified pre-existing hypertension complicating pregnancy, second trimester: Secondary | ICD-10-CM | POA: Diagnosis not present

## 2019-12-25 DIAGNOSIS — R102 Pelvic and perineal pain: Secondary | ICD-10-CM

## 2019-12-25 LAB — URINALYSIS, ROUTINE W REFLEX MICROSCOPIC
Bilirubin Urine: NEGATIVE
Glucose, UA: NEGATIVE mg/dL
Ketones, ur: NEGATIVE mg/dL
Leukocytes,Ua: NEGATIVE
Nitrite: NEGATIVE
Protein, ur: NEGATIVE mg/dL
Specific Gravity, Urine: 1.018 (ref 1.005–1.030)
pH: 6 (ref 5.0–8.0)

## 2019-12-25 LAB — CERVICOVAGINAL ANCILLARY ONLY
Bacterial Vaginitis (gardnerella): POSITIVE — AB
Candida Glabrata: NEGATIVE
Candida Vaginitis: NEGATIVE
Chlamydia: NEGATIVE
Comment: NEGATIVE
Comment: NEGATIVE
Comment: NEGATIVE
Comment: NEGATIVE
Comment: NEGATIVE
Comment: NORMAL
Neisseria Gonorrhea: NEGATIVE
Trichomonas: NEGATIVE

## 2019-12-25 MED ORDER — ACETAMINOPHEN 500 MG PO TABS
1000.0000 mg | ORAL_TABLET | Freq: Once | ORAL | Status: AC
  Administered 2019-12-25: 1000 mg via ORAL
  Filled 2019-12-25: qty 2

## 2019-12-25 MED ORDER — COMFORT FIT MATERNITY SUPP LG MISC
1.0000 [IU] | Freq: Every day | 0 refills | Status: DC
Start: 2019-12-25 — End: 2020-02-14

## 2019-12-25 MED ORDER — CYCLOBENZAPRINE HCL 5 MG PO TABS
10.0000 mg | ORAL_TABLET | Freq: Once | ORAL | Status: AC
  Administered 2019-12-25: 10 mg via ORAL
  Filled 2019-12-25: qty 2

## 2019-12-25 MED ORDER — CYCLOBENZAPRINE HCL 10 MG PO TABS: 10.0000 mg | ORAL_TABLET | Freq: Two times a day (BID) | ORAL | 0 refills | Status: DC | PRN

## 2019-12-25 MED ORDER — ACETAMINOPHEN 325 MG PO TABS: 1000.0000 mg | ORAL_TABLET | Freq: Four times a day (QID) | ORAL | Status: DC | PRN

## 2019-12-25 NOTE — Discharge Instructions (Signed)
   PREGNANCY SUPPORT BELT: °You are not alone, Seventy-five percent of women have some sort of abdominal or back pain at some point in their pregnancy. Your baby is growing at a fast pace, which means that your whole body is rapidly trying to adjust to the changes. As your uterus grows, your back may start feeling a bit under stress and this can result in back or abdominal pain that can go from mild, and therefore bearable, to severe pains that will not allow you to sit or lay down comfortably, When it comes to dealing with pregnancy-related pains and cramps, some pregnant women usually prefer natural remedies, which the market is filled with nowadays. For example, wearing a pregnancy support belt can help ease and lessen your discomfort and pain. °WHAT ARE THE BENEFITS OF WEARING A PREGNANCY SUPPORT BELT? A pregnancy support belt provides support to the lower portion of the belly taking some of the weight of the growing uterus and distributing to the other parts of your body. It is designed make you comfortable and gives you extra support. Over the years, the pregnancy apparel market has been studying the needs and wants of pregnant women and they have come up with the most comfortable pregnancy support belts that woman could ever ask for. In fact, you will no longer have to wear a stretched-out or bulky pregnancy belt that is visible underneath your clothes and makes you feel even more uncomfortable. Nowadays, a pregnancy support belt is made of comfortable and stretchy materials that will not irritate your skin but will actually make you feel at ease and you will not even notice you are wearing it. They are easy to put on and adjust during the day and can be worn at night for additional support.  °BENEFITS: °• Relives Back pain °• Relieves Abdominal Muscle and Leg Pain °• Stabilizes the Pelvic Ring °• Offers a Cushioned Abdominal Lift Pad °• Relieves pressure on the Sciatic Nerve Within Minutes °WHERE TO GET  YOUR PREGNANCY BELT: [employer] [phone] @[address] , [place] [address] ° ° °

## 2019-12-25 NOTE — MAU Provider Note (Signed)
History     CSN: [id number]  Arrival date and time: 12/25/19 0454   First Provider Initiated Contact with Patient 12/25/19 0535      Chief Complaint  Patient presents with  . Abdominal Pain   Sally Taylor is a 29 y.o. G2P0 at 1w5dwho presents to MAU with complaints of abdominal pain. Patient reports abdominal pain has been occurring for the past 2-3 days. Was in the office on 6/4 for abdominal pain and has swabs collected then. She denies pain as lower abdominal sharp stabbing and cramping. Rates pain 9/10- has taken Tylenol over the past two days with little to no relief. Reports taking 1 tablet ('325mg'$ ) of tylenol yesterday and 2 tablets ('650mg'$ ) on Saturday. She denies vaginal bleeding or discharge. She reports feeling fetal movement.    OB History    Gravida  2   Para  0   Term  0   Preterm  0   AB  1   Living  0     SAB  1   TAB  0   Ectopic  0   Multiple  0   Live Births  0           Past Medical History:  Diagnosis Date  . Asthma   . Headache   . Hypertension     Past Surgical History:  Procedure Laterality Date  . LAPAROSCOPIC APPENDECTOMY N/A 04/01/2017   Procedure: APPENDECTOMY LAPAROSCOPIC;  Surgeon: MJohnathan Hausen MD;  Location: [facility];  Service: General;  Laterality: N/A;    Family History  Problem Relation Age of Onset  . Hypertension Mother     Social History   Tobacco Use  . Smoking status: Never Smoker  . Smokeless tobacco: Never Used  Substance Use Topics  . Alcohol use: Not Currently    Comment: occ  . Drug use: No    Allergies: No Known Allergies  Medications Prior to Admission  Medication Sig Dispense Refill Last Dose  . [DISCONTINUED] acetaminophen (TYLENOL) 325 MG tablet Take 1 tablet (325 mg total) by mouth every 6 (six) hours as needed for mild pain, moderate pain or headache.   12/24/2019 at Unknown time  . aspirin EC 81 MG tablet Take 1 tablet (81 mg total) by mouth daily. Take after 12 weeks for  prevention of preeclampsia later in pregnancy 300 tablet 2   . Blood Pressure KIT 1 kit by Does not apply route daily. 1 kit 0   . Prenatal Vit-Fe Fumarate-FA (PREPLUS) 27-1 MG TABS Take 1 tablet by mouth daily. 30 tablet 13     Review of Systems  Constitutional: Negative.   Respiratory: Negative.   Cardiovascular: Negative.   Gastrointestinal: Positive for abdominal pain. Negative for constipation, diarrhea, nausea and vomiting.  Genitourinary: Negative.   Musculoskeletal: Negative.   Neurological: Negative.   Psychiatric/Behavioral: Negative.    Physical Exam   Blood pressure 132/84, pulse 79, temperature 98.3 F (36.8 C), resp. rate 17, unknown if currently breastfeeding.  Physical Exam  Nursing note and vitals reviewed. Constitutional: She is oriented to person, place, and time. She appears well-developed and well-nourished. No distress.  Cardiovascular: Normal rate and regular rhythm.  Respiratory: Effort normal and breath sounds normal. No respiratory distress. She has no wheezes.  GI: Soft. There is no abdominal tenderness. There is no rebound and no guarding.  Gravid appropriate for gestational age   Musculoskeletal:        General: No edema. Normal range of motion.  Neurological: She is alert and oriented to person, place, and time.  Psychiatric: She has a normal mood and affect. Her behavior is normal. Thought content normal.   FHR 152 by doppler   Dilation: Closed Effacement (%): Thick Cervical Position: Posterior Exam by:: Wende Bushy CNM   MAU Course  Procedures  MDM Tylenol '1000mg'$   Flexeril '10mg'$   UA negative   Initial BP elevated of 142/88, repeat x2 normal - continue to watch BP at home and in the office   Reassessment after medication - patient reports pain is completely resolved  Educated and discussed RLP during pregnancy, safe medication to take and use of maternity support belt. Encouraged patient to take '1000mg'$  of Tylenol for RLP if tylenol does  not relieve then can take Flexeril. Rx for Flexeril sent to pharmacy of choice. Rx for maternity support belt given to patient.   Discussed reasons to return to MAU. Follow up as scheduled in the office. Return to MAU as needed. Pt stable at time of discharge.   Assessment and Plan   1. Pain of round ligament during pregnancy   2. [redacted] weeks gestation of pregnancy   3. Elevated BP without diagnosis of hypertension    Discharge home Follow up as scheduled in the office for prenatal care Return to MAU as needed for reasons discussed and/or emergencies  Rx for flexeril and maternity support belt   Follow-up [place] Follow up.   Specialty: Obstetrics and Gynecology Why: Follow up as scheduled for prenatal care and return to MAU as needed Contact information: [address], [place] [phone]         Allergies as of 12/25/2019   No Known Allergies     Medication List    TAKE these medications   acetaminophen 325 MG tablet Commonly known as: TYLENOL Take 3 tablets (975 mg total) by mouth every 6 (six) hours as needed for mild pain, moderate pain or headache. What changed: how much to take   aspirin EC 81 MG tablet Take 1 tablet (81 mg total) by mouth daily. Take after 12 weeks for prevention of preeclampsia later in pregnancy   Blood Pressure Kit 1 kit by Does not apply route daily.   Comfort Fit Maternity Supp Lg Misc 1 Units by Does not apply route daily.   cyclobenzaprine 10 MG tablet Commonly known as: FLEXERIL Take 1 tablet (10 mg total) by mouth 2 (two) times daily as needed for muscle spasms.   PrePLUS 27-1 MG Tabs Take 1 tablet by mouth daily.       Lajean Manes CNM 12/25/2019, 7:14 AM

## 2019-12-27 ENCOUNTER — Other Ambulatory Visit: Payer: Self-pay | Admitting: Advanced Practice Midwife

## 2019-12-27 DIAGNOSIS — O280 Abnormal hematological finding on antenatal screening of mother: Secondary | ICD-10-CM

## 2019-12-27 DIAGNOSIS — Z3A19 19 weeks gestation of pregnancy: Secondary | ICD-10-CM

## 2019-12-27 LAB — CULTURE, OB URINE

## 2019-12-27 LAB — URINE CULTURE, OB REFLEX

## 2020-01-01 ENCOUNTER — Encounter: Payer: Self-pay | Admitting: Obstetrics

## 2020-01-01 MED ORDER — METRONIDAZOLE 500 MG PO TABS: 500.0000 mg | ORAL_TABLET | Freq: Two times a day (BID) | ORAL | 0 refills | Status: DC

## 2020-01-01 NOTE — Addendum Note (Signed)
Addended by: Manya Silvas on: 01/01/2020 03:57 PM   Modules accepted: Orders

## 2020-01-09 ENCOUNTER — Other Ambulatory Visit: Payer: Self-pay

## 2020-01-09 ENCOUNTER — Other Ambulatory Visit: Payer: Self-pay | Admitting: Family Medicine

## 2020-01-09 ENCOUNTER — Other Ambulatory Visit: Payer: Self-pay | Admitting: *Deleted

## 2020-01-09 ENCOUNTER — Ambulatory Visit: Payer: Medicaid Other | Attending: Family Medicine

## 2020-01-09 DIAGNOSIS — Z3A18 18 weeks gestation of pregnancy: Secondary | ICD-10-CM

## 2020-01-09 DIAGNOSIS — O3412 Maternal care for benign tumor of corpus uteri, second trimester: Secondary | ICD-10-CM

## 2020-01-09 DIAGNOSIS — Z3480 Encounter for supervision of other normal pregnancy, unspecified trimester: Secondary | ICD-10-CM | POA: Diagnosis present

## 2020-01-09 DIAGNOSIS — Z363 Encounter for antenatal screening for malformations: Secondary | ICD-10-CM

## 2020-01-09 DIAGNOSIS — O289 Unspecified abnormal findings on antenatal screening of mother: Secondary | ICD-10-CM | POA: Diagnosis not present

## 2020-01-09 DIAGNOSIS — Z148 Genetic carrier of other disease: Secondary | ICD-10-CM

## 2020-01-09 DIAGNOSIS — D259 Leiomyoma of uterus, unspecified: Secondary | ICD-10-CM | POA: Diagnosis not present

## 2020-01-09 DIAGNOSIS — O162 Unspecified maternal hypertension, second trimester: Secondary | ICD-10-CM

## 2020-01-23 ENCOUNTER — Encounter: Payer: Self-pay | Admitting: Family Medicine

## 2020-01-23 ENCOUNTER — Telehealth (INDEPENDENT_AMBULATORY_CARE_PROVIDER_SITE_OTHER): Payer: Medicaid Other | Admitting: Family Medicine

## 2020-01-23 VITALS — BP 150/75 | HR 83

## 2020-01-23 DIAGNOSIS — Z3A20 20 weeks gestation of pregnancy: Secondary | ICD-10-CM

## 2020-01-23 DIAGNOSIS — O139 Gestational [pregnancy-induced] hypertension without significant proteinuria, unspecified trimester: Secondary | ICD-10-CM

## 2020-01-23 DIAGNOSIS — Z148 Genetic carrier of other disease: Secondary | ICD-10-CM

## 2020-01-23 DIAGNOSIS — Z3480 Encounter for supervision of other normal pregnancy, unspecified trimester: Secondary | ICD-10-CM

## 2020-01-23 DIAGNOSIS — Z8759 Personal history of other complications of pregnancy, childbirth and the puerperium: Secondary | ICD-10-CM

## 2020-01-23 NOTE — Progress Notes (Signed)
Virtual ROB   CC: None   *pt wants to discuss prenatal gummies. Pt note not taking baby aspirin . Pt denies any HA's no visual changes.  Pt able to take B/P at home.   B/P 150/75  P:83

## 2020-01-23 NOTE — Progress Notes (Addendum)
   TELEHEALTH OBSTETRICS VISIT ENCOUNTER NOTE  I connected with Ryenne Cherie Laberth-Brown on 01/23/20 at 10:15 AM EDT by Mychart Video at home and verified that I am speaking with the correct person using two identifiers.   I discussed the limitations, risks, security and privacy concerns of performing an evaluation and management service by telephone and the availability of in person appointments. I also discussed with the patient that there may be a patient responsible charge related to this service. The patient expressed understanding and agreed to proceed.  Subjective:  Sally Taylor is a 29 y.o. G2P0010 at [redacted]w[redacted]d being followed for ongoing prenatal care.  She is currently monitored for the following issues for this low-risk pregnancy and has History of ectopic pregnancy; Supervision of other normal pregnancy, antepartum; Genetic carrier status; and Transient hypertension of pregnancy, antepartum on their problem list.  Patient reports no complaints. Reports fetal movement. Denies any contractions, bleeding or leaking of fluid.   The following portions of the patient's history were reviewed and updated as appropriate: allergies, current medications, past family history, past medical history, past social history, past surgical history and problem list.   Objective:   General:  Alert, oriented and cooperative.   Mental Status: Normal mood and affect perceived. Normal judgment and thought content.  Rest of physical exam deferred due to type of encounter  Assessment and Plan:  Pregnancy: G2P0010 at [redacted]w[redacted]d 1. Supervision of other normal pregnancy, antepartum - Continue routine Prenatal care - Gender reveal 02/17/20  2. Transient hypertension of pregnancy, antepartum - BP elevated today, scheduled for BP check tomorrow in person (if BP elevated, will get Pre-E labs) - Discussed BP d/o of pregnancy, asymptomatic at this time - Precautions for Pre-E, will go to MAU if  symptoms arise - Recommended to take baby ASA - Baseline P:Cr 0.06  3. History of ectopic pregnancy  4. 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  I discussed the assessment and treatment plan with the patient. The patient was provided an opportunity to ask questions and all were answered. The patient agreed with the plan and demonstrated an understanding of the instructions. The patient was advised to call back or seek an in-person office evaluation/go to [facility]U at [facility] for any urgent or concerning symptoms. Please refer to After Visit Summary for other counseling recommendations.   I provided 9 minutes of non-face-to-face time during this encounter.  Return for Tomorrow AM, Nurse visit for BP check.  Future Appointments  Date Time Provider [place]  02/06/2020  9:45 AM [facility] NURSE [facility]  02/06/2020  9:45 AM [facility] US4 [facility] [place] [facility] [place]

## 2020-01-23 NOTE — Patient Instructions (Signed)

## 2020-01-24 ENCOUNTER — Other Ambulatory Visit: Payer: Self-pay

## 2020-01-24 ENCOUNTER — Ambulatory Visit (INDEPENDENT_AMBULATORY_CARE_PROVIDER_SITE_OTHER): Payer: Medicaid Other

## 2020-01-24 VITALS — BP 129/81 | HR 84 | Ht 62.0 in | Wt 218.0 lb

## 2020-01-24 DIAGNOSIS — O139 Gestational [pregnancy-induced] hypertension without significant proteinuria, unspecified trimester: Secondary | ICD-10-CM

## 2020-01-24 DIAGNOSIS — Z0130 Encounter for examination of blood pressure without abnormal findings: Secondary | ICD-10-CM

## 2020-01-24 NOTE — Progress Notes (Signed)
Subjective:  Sally Taylor is a 29 y.o. female here for BP check.   Hypertension ROS: not taking medications regularly as instructed, no medication side effects noted, no TIA's, no chest pain on exertion, no dyspnea on exertion and no swelling of ankles.    Objective:  BP 129/81   Pulse 84   Ht 5\' 2"  (1.575 m)   Wt 218 lb (98.9 kg)   LMP  (LMP Unknown)   BMI 39.87 kg/m   Appearance alert, well appearing, and in no distress. General exam BP noted to be well controlled today in office.    Assessment:   Blood Pressure stable.   Plan:  Follow up: 2 days and as needed. Patient is to bring her BP Cuff to visit on Friday for [place] to check the cuff, etc per Dr. Rosana Hoes. BP readings at home is elevated in the 140's and 150's.

## 2020-01-25 NOTE — Progress Notes (Signed)
I have reviewed this chart and agree with the RN/CMA assessment and management.    K. Meryl Marylynne Keelin, M.D. Attending [facility] (Faculty Practice)   

## 2020-01-26 ENCOUNTER — Ambulatory Visit (HOSPITAL_BASED_OUTPATIENT_CLINIC_OR_DEPARTMENT_OTHER): Payer: Medicaid Other

## 2020-01-26 ENCOUNTER — Other Ambulatory Visit: Payer: Self-pay

## 2020-01-26 DIAGNOSIS — Z0130 Encounter for examination of blood pressure without abnormal findings: Secondary | ICD-10-CM

## 2020-01-26 DIAGNOSIS — Z3480 Encounter for supervision of other normal pregnancy, unspecified trimester: Secondary | ICD-10-CM

## 2020-01-26 MED ORDER — BLOOD PRESSURE KIT DEVI
1.0000 | 0 refills | Status: DC
Start: 2020-01-26 — End: 2020-05-08

## 2020-01-26 NOTE — Progress Notes (Addendum)
Subjective:  Sally Taylor is a 29 y.o. female here for BP check.   Hypertension ROS: no TIA's, no chest pain on exertion, no dyspnea on exertion and no swelling of ankles.    Objective:  BP 125/77   Pulse 87   LMP  (LMP Unknown)   Appearance alert, well appearing, and in no distress. General exam BP noted to be well controlled today in office.    Assessment:   Blood Pressure well controlled.   Plan:  Current treatment plan is effective, no change in therapy. PRN.      Patient was assessed and managed by nursing staff during this encounter. I have reviewed the chart and agree with the documentation and plan. I have also made any necessary editorial changes.  Clarisa Fling, NP 01/26/2020 12:02 PM

## 2020-01-31 ENCOUNTER — Inpatient Hospital Stay (HOSPITAL_COMMUNITY)
Admission: AD | Admit: 2020-01-31 | Discharge: 2020-01-31 | Disposition: A | Discharge status: Home / Self Care | Payer: Medicaid Other | Attending: Obstetrics and Gynecology | Admitting: Obstetrics and Gynecology

## 2020-01-31 ENCOUNTER — Encounter (HOSPITAL_COMMUNITY): Payer: Self-pay | Admitting: Obstetrics and Gynecology

## 2020-01-31 ENCOUNTER — Other Ambulatory Visit: Payer: Self-pay

## 2020-01-31 DIAGNOSIS — R102 Pelvic and perineal pain: Secondary | ICD-10-CM | POA: Insufficient documentation

## 2020-01-31 DIAGNOSIS — O132 Gestational [pregnancy-induced] hypertension without significant proteinuria, second trimester: Secondary | ICD-10-CM | POA: Insufficient documentation

## 2020-01-31 DIAGNOSIS — J45909 Unspecified asthma, uncomplicated: Secondary | ICD-10-CM | POA: Insufficient documentation

## 2020-01-31 DIAGNOSIS — Z3A22 22 weeks gestation of pregnancy: Secondary | ICD-10-CM | POA: Diagnosis not present

## 2020-01-31 DIAGNOSIS — Z7982 Long term (current) use of aspirin: Secondary | ICD-10-CM | POA: Insufficient documentation

## 2020-01-31 DIAGNOSIS — O99512 Diseases of the respiratory system complicating pregnancy, second trimester: Secondary | ICD-10-CM | POA: Insufficient documentation

## 2020-01-31 DIAGNOSIS — Z8249 Family history of ischemic heart disease and other diseases of the circulatory system: Secondary | ICD-10-CM | POA: Diagnosis not present

## 2020-01-31 DIAGNOSIS — Z3480 Encounter for supervision of other normal pregnancy, unspecified trimester: Secondary | ICD-10-CM

## 2020-01-31 DIAGNOSIS — Z79899 Other long term (current) drug therapy: Secondary | ICD-10-CM | POA: Insufficient documentation

## 2020-01-31 DIAGNOSIS — O26892 Other specified pregnancy related conditions, second trimester: Secondary | ICD-10-CM

## 2020-01-31 LAB — URINALYSIS, ROUTINE W REFLEX MICROSCOPIC
Bilirubin Urine: NEGATIVE
Glucose, UA: NEGATIVE mg/dL
Ketones, ur: 20 mg/dL — AB
Leukocytes,Ua: NEGATIVE
Nitrite: NEGATIVE
Protein, ur: 30 mg/dL — AB
Specific Gravity, Urine: 1.026 (ref 1.005–1.030)
pH: 6 (ref 5.0–8.0)

## 2020-01-31 LAB — WET PREP, GENITAL
Clue Cells Wet Prep HPF POC: NONE SEEN
Sperm: NONE SEEN
Trich, Wet Prep: NONE SEEN
Yeast Wet Prep HPF POC: NONE SEEN

## 2020-01-31 MED ORDER — IBUPROFEN 800 MG PO TABS
400.0000 mg | ORAL_TABLET | Freq: Once | ORAL | Status: AC
  Administered 2020-01-31: 400 mg via ORAL
  Filled 2020-01-31: qty 1

## 2020-01-31 NOTE — MAU Note (Signed)
Pt reports lower abdominal cramping that started 2 days ago.   Denies vaginal bleeding or LOF.   Pt reports decrease in appetite

## 2020-01-31 NOTE — Discharge Instructions (Signed)

## 2020-01-31 NOTE — MAU Provider Note (Signed)
Patient Sally Taylor is a 29 y.o. G2P0010 at 33w0dhere with complaints of abdominal cramping. She felt this pain before and visited the MAU for this issue. It resolved after that visit in early June but has now returned. The pain started back up again two days ago. She "dealt with it all last night and couldn't sleep".   She denies contractions, LOF, vaginal bleeding, dysuria, foul-smelling urine.  She denies constipation or diarrhea. [facility] complicated by transient hypertension.   History     CSN: [id number] Arrival date and time: 01/31/20 1355   First Provider Initiated Contact with Patient 01/31/20 1444      Chief Complaint  Patient presents with  . Abdominal Pain   Abdominal Pain This is a recurrent problem. The current episode started more than 1 month ago. The problem occurs intermittently. The pain is located in the suprapubic region. The pain is at a severity of 9/10. The quality of the pain is cramping. The abdominal pain does not radiate.  She tried Flexeril and Tylenol yesterday around 7 pm, and it didn't help. She tried her maternity belt yesterday but it didn't help either.   OB History    Gravida  2   Para  0   Term  0   Preterm  0   AB  1   Living  0     SAB  1   TAB  0   Ectopic  0   Multiple  0   Live Births  0           Past Medical History:  Diagnosis Date  . Asthma   . Headache   . Hypertension     Past Surgical History:  Procedure Laterality Date  . LAPAROSCOPIC APPENDECTOMY N/A 04/01/2017   Procedure: APPENDECTOMY LAPAROSCOPIC;  Surgeon: MJohnathan Hausen MD;  Location: [facility];  Service: General;  Laterality: N/A;    Family History  Problem Relation Age of Onset  . Hypertension Mother     Social History   Tobacco Use  . Smoking status: Never Smoker  . Smokeless tobacco: Never Used  Vaping Use  . Vaping Use: Never used  Substance Use Topics  . Alcohol use: Not  Currently    Comment: occ  . Drug use: No    Allergies: No Known Allergies  Medications Prior to Admission  Medication Sig Dispense Refill Last Dose  . acetaminophen (TYLENOL) 325 MG tablet Take 3 tablets (975 mg total) by mouth every 6 (six) hours as needed for mild pain, moderate pain or headache.   01/31/2020 at Unknown time  . Blood Pressure KIT 1 kit by Does not apply route daily. 1 kit 0 Past Week at Unknown time  . Blood Pressure Monitoring (BLOOD PRESSURE KIT) DEVI 1 kit by Does not apply route once a week. Check Blood Pressure regularly and record readings into the [employer] App.  Large Cuff.  DX O90.0 1 each 0 Past Week at Unknown time  . aspirin EC 81 MG tablet Take 1 tablet (81 mg total) by mouth daily. Take after 12 weeks for prevention of preeclampsia later in pregnancy (Patient not taking: Reported on 01/23/2020) 300 tablet 2   . cyclobenzaprine (FLEXERIL) 10 MG tablet Take 1 tablet (10 mg total) by mouth 2 (two) times daily as needed for muscle spasms. (Patient not taking: Reported on 01/23/2020) 20 tablet 0   . Elastic Bandages & Supports (COMFORT FIT MATERNITY SUPP LG)  MISC 1 Units by Does not apply route daily. (Patient not taking: Reported on 01/23/2020) 1 each 0   . metroNIDAZOLE (FLAGYL) 500 MG tablet Take 1 tablet (500 mg total) by mouth 2 (two) times daily. (Patient not taking: Reported on 01/23/2020) 14 tablet 0   . Prenatal Vit-Fe Fumarate-FA (PREPLUS) 27-1 MG TABS Take 1 tablet by mouth daily. (Patient not taking: Reported on 01/23/2020) 30 tablet 13     Review of Systems  Constitutional: Negative.   HENT: Negative.   Gastrointestinal: Positive for abdominal pain.  Genitourinary: Negative.   Musculoskeletal: Negative.   Neurological: Negative.   Hematological: Negative.   Psychiatric/Behavioral: Negative.    Physical Exam   Blood pressure 134/79, pulse 78, temperature 98.4 F (36.9 C), temperature source Oral, resp. rate 20, weight 95.4 kg, SpO2 100 %, unknown if  currently breastfeeding.  Physical Exam Constitutional:      Appearance: She is well-developed.  HENT:     Head: Normocephalic.  Cardiovascular:     Rate and Rhythm: Normal rate.  Abdominal:     Palpations: Abdomen is soft.  Genitourinary:    Vagina: Normal.     Comments: NEFG; no bleeding, cervical os is closed, long, anterior. No CMT, adnexal tenderness. She has suprapubic tenderness.  Neurological:     Mental Status: She is alert.     MAU Course  Procedures  MDM -wet prep: negative for signs of infection -FHR is 141 by Doppler.  -Patient would like heating pad and snack> heating pad helped a little bit, patient would like to try ibuprofen.  1615: Patient is sleeping in bed 1715: Patient feels better, feels like she can be discharged.   Assessment and Plan   1. Suprapubic pain   2. Supervision of other normal pregnancy, antepartum   -Reviewed comfort measures, heat, exercise and try relaxing in swimming pool for comfort -urine culture pending; we will notify her if she needs treatment.  -GC CT pending.  -Ambulatory referral to PT made; patient knows that PT will call her.  [name] 01/31/2020, 2:53 PM

## 2020-02-01 LAB — CULTURE, OB URINE

## 2020-02-01 LAB — GC/CHLAMYDIA PROBE AMP (~~LOC~~) NOT AT ARMC
Chlamydia: NEGATIVE
Comment: NEGATIVE
Comment: NORMAL
Neisseria Gonorrhea: NEGATIVE

## 2020-02-06 ENCOUNTER — Other Ambulatory Visit: Payer: Self-pay | Admitting: Maternal & Fetal Medicine

## 2020-02-06 ENCOUNTER — Encounter: Payer: Medicaid Other | Admitting: Family Medicine

## 2020-02-06 ENCOUNTER — Other Ambulatory Visit: Payer: Self-pay

## 2020-02-06 ENCOUNTER — Ambulatory Visit: Payer: Medicaid Other | Admitting: *Deleted

## 2020-02-06 ENCOUNTER — Ambulatory Visit: Payer: Medicaid Other | Attending: Obstetrics and Gynecology

## 2020-02-06 ENCOUNTER — Encounter: Payer: Self-pay | Admitting: Family Medicine

## 2020-02-06 ENCOUNTER — Other Ambulatory Visit: Payer: Self-pay | Admitting: *Deleted

## 2020-02-06 VITALS — BP 133/74 | HR 80

## 2020-02-06 DIAGNOSIS — Z3480 Encounter for supervision of other normal pregnancy, unspecified trimester: Secondary | ICD-10-CM | POA: Diagnosis present

## 2020-02-06 DIAGNOSIS — O365990 Maternal care for other known or suspected poor fetal growth, unspecified trimester, not applicable or unspecified: Secondary | ICD-10-CM

## 2020-02-06 DIAGNOSIS — O0990 Supervision of high risk pregnancy, unspecified, unspecified trimester: Secondary | ICD-10-CM | POA: Diagnosis present

## 2020-02-06 DIAGNOSIS — Z362 Encounter for other antenatal screening follow-up: Secondary | ICD-10-CM

## 2020-02-06 DIAGNOSIS — O162 Unspecified maternal hypertension, second trimester: Secondary | ICD-10-CM

## 2020-02-06 DIAGNOSIS — O26879 Cervical shortening, unspecified trimester: Secondary | ICD-10-CM

## 2020-02-06 DIAGNOSIS — O3412 Maternal care for benign tumor of corpus uteri, second trimester: Secondary | ICD-10-CM

## 2020-02-06 DIAGNOSIS — O289 Unspecified abnormal findings on antenatal screening of mother: Secondary | ICD-10-CM

## 2020-02-06 DIAGNOSIS — O365920 Maternal care for other known or suspected poor fetal growth, second trimester, not applicable or unspecified: Secondary | ICD-10-CM

## 2020-02-06 DIAGNOSIS — D259 Leiomyoma of uterus, unspecified: Secondary | ICD-10-CM

## 2020-02-06 DIAGNOSIS — O26872 Cervical shortening, second trimester: Secondary | ICD-10-CM

## 2020-02-06 DIAGNOSIS — Z148 Genetic carrier of other disease: Secondary | ICD-10-CM

## 2020-02-07 ENCOUNTER — Other Ambulatory Visit: Payer: Self-pay | Admitting: Student

## 2020-02-07 ENCOUNTER — Encounter: Payer: Self-pay | Admitting: Student

## 2020-02-07 DIAGNOSIS — R102 Pelvic and perineal pain: Secondary | ICD-10-CM

## 2020-02-07 DIAGNOSIS — O26892 Other specified pregnancy related conditions, second trimester: Secondary | ICD-10-CM

## 2020-02-07 HISTORY — DX: Other specified pregnancy related conditions, second trimester: R10.2

## 2020-02-07 HISTORY — DX: Other specified pregnancy related conditions, second trimester: O26.892

## 2020-02-12 ENCOUNTER — Other Ambulatory Visit: Payer: Self-pay

## 2020-02-12 ENCOUNTER — Ambulatory Visit (HOSPITAL_BASED_OUTPATIENT_CLINIC_OR_DEPARTMENT_OTHER): Payer: Medicaid Other

## 2020-02-12 ENCOUNTER — Encounter (HOSPITAL_COMMUNITY): Payer: Self-pay | Admitting: Family Medicine

## 2020-02-12 ENCOUNTER — Encounter: Payer: Self-pay | Admitting: *Deleted

## 2020-02-12 ENCOUNTER — Ambulatory Visit: Payer: Medicaid Other | Admitting: *Deleted

## 2020-02-12 ENCOUNTER — Observation Stay (HOSPITAL_COMMUNITY)
Admission: AD | Admit: 2020-02-12 | Discharge: 2020-02-14 | Disposition: A | Discharge status: Home / Self Care | Payer: Medicaid Other | Attending: Obstetrics and Gynecology | Admitting: Obstetrics and Gynecology

## 2020-02-12 DIAGNOSIS — Z3A23 23 weeks gestation of pregnancy: Secondary | ICD-10-CM

## 2020-02-12 DIAGNOSIS — O365920 Maternal care for other known or suspected poor fetal growth, second trimester, not applicable or unspecified: Secondary | ICD-10-CM | POA: Diagnosis not present

## 2020-02-12 DIAGNOSIS — O365990 Maternal care for other known or suspected poor fetal growth, unspecified trimester, not applicable or unspecified: Secondary | ICD-10-CM | POA: Insufficient documentation

## 2020-02-12 DIAGNOSIS — J45909 Unspecified asthma, uncomplicated: Secondary | ICD-10-CM | POA: Insufficient documentation

## 2020-02-12 DIAGNOSIS — Z362 Encounter for other antenatal screening follow-up: Secondary | ICD-10-CM | POA: Diagnosis not present

## 2020-02-12 DIAGNOSIS — O289 Unspecified abnormal findings on antenatal screening of mother: Secondary | ICD-10-CM

## 2020-02-12 DIAGNOSIS — O3412 Maternal care for benign tumor of corpus uteri, second trimester: Secondary | ICD-10-CM | POA: Diagnosis not present

## 2020-02-12 DIAGNOSIS — Z3480 Encounter for supervision of other normal pregnancy, unspecified trimester: Secondary | ICD-10-CM

## 2020-02-12 DIAGNOSIS — Z7982 Long term (current) use of aspirin: Secondary | ICD-10-CM | POA: Diagnosis not present

## 2020-02-12 DIAGNOSIS — O139 Gestational [pregnancy-induced] hypertension without significant proteinuria, unspecified trimester: Secondary | ICD-10-CM | POA: Diagnosis present

## 2020-02-12 DIAGNOSIS — O26892 Other specified pregnancy related conditions, second trimester: Secondary | ICD-10-CM | POA: Insufficient documentation

## 2020-02-12 DIAGNOSIS — D259 Leiomyoma of uterus, unspecified: Secondary | ICD-10-CM

## 2020-02-12 DIAGNOSIS — O26873 Cervical shortening, third trimester: Secondary | ICD-10-CM | POA: Diagnosis not present

## 2020-02-12 DIAGNOSIS — O26872 Cervical shortening, second trimester: Secondary | ICD-10-CM

## 2020-02-12 DIAGNOSIS — Z3A24 24 weeks gestation of pregnancy: Secondary | ICD-10-CM | POA: Diagnosis not present

## 2020-02-12 DIAGNOSIS — Z148 Genetic carrier of other disease: Secondary | ICD-10-CM

## 2020-02-12 DIAGNOSIS — O133 Gestational [pregnancy-induced] hypertension without significant proteinuria, third trimester: Secondary | ICD-10-CM | POA: Insufficient documentation

## 2020-02-12 DIAGNOSIS — O26879 Cervical shortening, unspecified trimester: Secondary | ICD-10-CM | POA: Insufficient documentation

## 2020-02-12 DIAGNOSIS — I10 Essential (primary) hypertension: Secondary | ICD-10-CM | POA: Diagnosis present

## 2020-02-12 DIAGNOSIS — R102 Pelvic and perineal pain: Secondary | ICD-10-CM | POA: Insufficient documentation

## 2020-02-12 LAB — CBC
HCT: 33.9 % — ABNORMAL LOW (ref 36.0–46.0)
Hemoglobin: 10.8 g/dL — ABNORMAL LOW (ref 12.0–15.0)
MCH: 26.6 pg (ref 26.0–34.0)
MCHC: 31.9 g/dL (ref 30.0–36.0)
MCV: 83.5 fL (ref 80.0–100.0)
Platelets: 444 10*3/uL — ABNORMAL HIGH (ref 150–400)
RBC: 4.06 MIL/uL (ref 3.87–5.11)
RDW: 13.7 % (ref 11.5–15.5)
WBC: 11.4 10*3/uL — ABNORMAL HIGH (ref 4.0–10.5)
nRBC: 0 % (ref 0.0–0.2)

## 2020-02-12 LAB — TYPE AND SCREEN
ABO/RH(D): O POS
Antibody Screen: NEGATIVE

## 2020-02-12 MED ORDER — SODIUM CHLORIDE 0.9 % IV SOLN: 250.0000 mL | INTRAVENOUS | Status: DC | PRN

## 2020-02-12 MED ORDER — ACETAMINOPHEN 325 MG PO TABS: 650.0000 mg | ORAL_TABLET | ORAL | Status: DC | PRN

## 2020-02-12 MED ORDER — PROGESTERONE 200 MG PO CAPS
200.0000 mg | ORAL_CAPSULE | Freq: Every day | ORAL | Status: DC
  Administered 2020-02-12 – 2020-02-13 (×2): 200 mg via VAGINAL
  Filled 2020-02-12 (×5): qty 1

## 2020-02-12 MED ORDER — ONDANSETRON 4 MG PO TBDP: 4.0000 mg | ORAL_TABLET | Freq: Three times a day (TID) | ORAL | Status: DC | PRN

## 2020-02-12 MED ORDER — SODIUM CHLORIDE 0.9% FLUSH: 3.0000 mL | INTRAVENOUS | Status: DC | PRN

## 2020-02-12 MED ORDER — DIPHENHYDRAMINE HCL 25 MG PO CAPS: 25.0000 mg | ORAL_CAPSULE | ORAL | Status: DC | PRN

## 2020-02-12 MED ORDER — ZOLPIDEM TARTRATE 5 MG PO TABS: 5.0000 mg | ORAL_TABLET | Freq: Every evening | ORAL | Status: DC | PRN

## 2020-02-12 MED ORDER — PREPLUS 27-1 MG PO TABS: 1.0000 | ORAL_TABLET | Freq: Every day | ORAL | Status: DC

## 2020-02-12 MED ORDER — CALCIUM CARBONATE ANTACID 500 MG PO CHEW: 2.0000 | CHEWABLE_TABLET | ORAL | Status: DC | PRN

## 2020-02-12 MED ORDER — SODIUM CHLORIDE 0.9% FLUSH: 3.0000 mL | Freq: Two times a day (BID) | INTRAVENOUS | Status: DC

## 2020-02-12 MED ORDER — PRENATAL MULTIVITAMIN CH
1.0000 | ORAL_TABLET | Freq: Every day | ORAL | Status: DC
  Administered 2020-02-13: 1 via ORAL
  Filled 2020-02-12 (×2): qty 1

## 2020-02-12 MED ORDER — BETAMETHASONE SOD PHOS & ACET 6 (3-3) MG/ML IJ SUSP
12.0000 mg | INTRAMUSCULAR | Status: AC
  Administered 2020-02-12 – 2020-02-13 (×2): 12 mg via INTRAMUSCULAR
  Filled 2020-02-12 (×2): qty 5

## 2020-02-12 MED ORDER — DOCUSATE SODIUM 100 MG PO CAPS
100.0000 mg | ORAL_CAPSULE | Freq: Every day | ORAL | Status: DC
  Administered 2020-02-14: 100 mg via ORAL
  Filled 2020-02-12 (×3): qty 1

## 2020-02-12 MED ORDER — ASPIRIN EC 81 MG PO TBEC
81.0000 mg | DELAYED_RELEASE_TABLET | Freq: Every day | ORAL | Status: DC
  Administered 2020-02-13 – 2020-02-14 (×2): 81 mg via ORAL
  Filled 2020-02-12 (×2): qty 1

## 2020-02-12 NOTE — MAU Note (Signed)
PT SAYS   WENT TO MFM LAST WEEK- SAYS CERVIX  WAS  SHORT,  THEN TODAY AT 4 PM  WENT AGAIN- SAYS CERVIX SLIGHTLY OPEN .    SO TOLD  TO COME HERE FOR SHOT .

## 2020-02-13 DIAGNOSIS — O26873 Cervical shortening, third trimester: Secondary | ICD-10-CM | POA: Diagnosis not present

## 2020-02-13 NOTE — H&P (Signed)
FACULTY PRACTICE ANTEPARTUM ADMISSION HISTORY AND PHYSICAL NOTE   History of Present Illness: Sally Taylor is a 29 y.o. G2P0010 at 75w6dadmitted for Short Cervix.  Noted continued shortening today on U/S compared to prev U/S. 2.3cm -> funneling 0.7cm today  Patient reports the fetal movement as active. Patient reports uterine contraction  activity as none. Patient reports  vaginal bleeding as none. Patient describes fluid per vagina as None. Fetal presentation is breech.  Patient Active Problem List   Diagnosis Date Noted  . Short cervix during pregnancy in third trimester 02/12/2020  . Pelvic pain affecting pregnancy in second trimester, antepartum 02/07/2020  . IUGR (intrauterine growth restriction) affecting care of mother 02/06/2020  . Transient hypertension of pregnancy, antepartum 12/22/2019  . 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 Supervision of other normal pregnancy, antepartum 10/31/2019  . History of ectopic pregnancy 08/28/2019    Past Medical History:  Diagnosis Date  . Asthma   . Headache   . Hypertension   . Pelvic pain affecting pregnancy in second trimester, antepartum 02/07/2020    Past Surgical History:  Procedure Laterality Date  . LAPAROSCOPIC APPENDECTOMY N/A 04/01/2017   Procedure: APPENDECTOMY LAPAROSCOPIC;  Surgeon: MJohnathan Hausen MD;  Location: [facility];  Service: General;  Laterality: N/A;    OB History  Gravida Para Term Preterm AB Living  2 0 0 0 1 0  SAB TAB Ectopic Multiple Live Births  1 0 0 0 0    # Outcome Date GA Lbr Len/2nd Weight Sex Delivery Anes PTL Lv  2 Current           1 SAB 08/12/19 826w2d         Social History   Socioeconomic History  . Marital status: Single    Spouse name: Not on file  . Number of children: Not on file  . Years of education: Not on file  . Highest education level: Not on file  Occupational History  . Not on file  Tobacco Use  . Smoking status: Never Smoker  . Smokeless tobacco:  Never Used  Vaping Use  . Vaping Use: Never used  Substance and Sexual Activity  . Alcohol use: Not Currently    Comment: occ  . Drug use: No  . Sexual activity: Not Currently    Birth control/protection: None  Other Topics Concern  . Not on file  Social History Narrative  . Not on file   Social Determinants of Health   Financial Resource Strain:   . Difficulty of Paying Living Expenses:   Food Insecurity:   . Worried About RuCharity fundraisern the Last Year:   . RaArboriculturistn the Last Year:   Transportation Needs:   . LaFilm/video editorMedical):   . Marland Kitchenack of Transportation (Non-Medical):   Physical Activity:   . Days of Exercise per Week:   . Minutes of Exercise per Session:   Stress:   . Feeling of Stress :   Social Connections:   . Frequency of Communication with Friends and Family:   . Frequency of Social Gatherings with Friends and Family:   . Attends Religious Services:   . Active Member of Clubs or Organizations:   . Attends ClArchivisteetings:   . Marland Kitchenarital Status:     Family History  Problem Relation Age of Onset  . Hypertension Mother     No Known Allergies  Medications Prior to Admission  Medication Sig Dispense Refill  Last Dose  . acetaminophen (TYLENOL) 325 MG tablet Take 3 tablets (975 mg total) by mouth every 6 (six) hours as needed for mild pain, moderate pain or headache.     Marland Kitchen aspirin EC 81 MG tablet Take 1 tablet (81 mg total) by mouth daily. Take after 12 weeks for prevention of preeclampsia later in pregnancy 300 tablet 2   . Blood Pressure KIT 1 kit by Does not apply route daily. 1 kit 0   . Blood Pressure Monitoring (BLOOD PRESSURE KIT) DEVI 1 kit by Does not apply route once a week. Check Blood Pressure regularly and record readings into the [employer] App.  Large Cuff.  DX O90.0 1 each 0   . cyclobenzaprine (FLEXERIL) 10 MG tablet Take 1 tablet (10 mg total) by mouth 2 (two) times daily as needed for muscle spasms.  (Patient not taking: Reported on 01/23/2020) 20 tablet 0   . Elastic Bandages & Supports (COMFORT FIT MATERNITY SUPP LG) MISC 1 Units by Does not apply route daily. (Patient not taking: Reported on 01/23/2020) 1 each 0   . Prenatal Vit-Fe Fumarate-FA (PREPLUS) 27-1 MG TABS Take 1 tablet by mouth daily. (Patient not taking: Reported on 01/23/2020) 30 tablet 13     Review of Systems - Negative except noted in HPI  Vitals:  BP (!) 136/74 (BP Location: Left Arm)   Pulse 84   Temp 98 F (36.7 C) (Oral)   Resp 20   Ht '5\' 2"'$  (1.575 m)   Wt (!) 98.6 kg   LMP  (LMP Unknown)   SpO2 100%   BMI 39.74 kg/m  Physical Examination: CONSTITUTIONAL: Well-developed, well-nourished female in no acute distress.  HENT:  Normocephalic, atraumatic, External right and left ear normal. Oropharynx is clear and moist EYES: Conjunctivae and EOM are normal. Pupils are equal, round, and reactive to light. No scleral icterus.  NECK: Normal range of motion, supple, no masses SKIN: Skin is warm and dry. No rash noted. Not diaphoretic. No erythema. No pallor. [place]: Alert and oriented to person, place, and time. Normal reflexes, muscle tone coordination. No cranial nerve deficit noted. PSYCHIATRIC: Normal mood and affect. Normal behavior. Normal judgment and thought content. CARDIOVASCULAR: Normal heart rate noted, regular rhythm RESPIRATORY: Effort and breath sounds normal, no problems with respiration noted ABDOMEN: Soft, nontender, nondistended, gravid. MUSCULOSKELETAL: Normal range of motion. No edema and no tenderness. 2+ distal pulses.  Cervix: Evaluated by sterile speculum exam., Evaluated by digital exam in clinic today and found to be <0.5 cm/ 50%/-3 and fetal presentation is breech. Membranes:intact Fetal Monitoring:Baseline: 166 bpm   Labs:  Results for orders placed or performed during the hospital encounter of 02/12/20 (from the past 24 hour(s))  Type and screen [place]    Collection Time: 02/12/20  8:58 PM  Result Value Ref Range   ABO/RH(D) O POS    Antibody Screen NEG    Sample Expiration      02/15/2020,2359 Performed at [facility] [address], [place], [place] [address]   CBC on admission   Collection Time: 02/12/20 10:26 PM  Result Value Ref Range   WBC 11.4 (H) 4.0 - 10.5 K/uL   RBC 4.06 3.87 - 5.11 MIL/uL   Hemoglobin 10.8 (L) 12.0 - 15.0 g/dL   HCT 33.9 (L) 36 - 46 %   MCV 83.5 80.0 - 100.0 fL   MCH 26.6 26.0 - 34.0 pg   MCHC 31.9 30.0 - 36.0 g/dL   RDW  13.7 11.5 - 15.5 %   Platelets 444 (H) 150 - 400 K/uL   nRBC 0.0 0.0 - 0.2 %    Imaging Studies: [place] MFM OB Transvaginal  Result Date: 02/12/2020 ----------------------------------------------------------------------  OBSTETRICS REPORT                       (Signed Final 02/12/2020 05:51 pm) ---------------------------------------------------------------------- Patient Info  ID #:       [id number]                          D.O.B.:  1991/05/31 (29 yrs)  Name:       Sally Taylor                  Visit Date: 02/12/2020 05:15 pm              [facility] ---------------------------------------------------------------------- Performed By  Attending:        Tama High MD        Ref. Address:     [place]                                                             [place]  Performed By:     Rodrigo Ran BS      [facility]                                 [facility]                                                             [place] [facility]  Referred By:      [place] ---------------------------------------------------------------------- Orders  #  Description                            Code        Ordered By  1  [place] MFM UA CORD DOPPLER                76820.02    CORENTHIAN                                                       BOOKER  2  [place] MFM OB TRANSVAGINAL                58099.8     Sander Nephew  3  [place] MFM OB LIMITED                     33825.05    Sander Nephew ----------------------------------------------------------------------  #  Order #                     Accession #                Episode #  1  [id number]                   [id number]                 [id number]  2  [id number]                   [id number]                 [id number]  3  [id number]                   [id number]                 [id number] ---------------------------------------------------------------------- Indications  Encounter for other antenatal screening        Z36.2  follow-up  (low risk NIPS)  Cervical shortening, second trimester          O26.872  Maternal care for known or suspected poor      O36.5920  fetal growth, second trimester, not applicable  or unspecified IUGR  Uterine fibroids affecting pregnancy in        O34.12, D25.9  second trimester, antepartum  Abnormal finding on antenatal screening        O28.9  (elevated AFP 2.84)  Genetic carrier (SMA)  Z14.8  [redacted] weeks gestation of pregnancy                Z3A.23 ---------------------------------------------------------------------- Fetal Evaluation  Num Of Fetuses:         1  Fetal Heart Rate(bpm):  166  Cardiac Activity:       Observed  Presentation:           Breech  Amniotic Fluid  AFI FV:      Within normal limits                              Largest Pocket(cm)                              6.5 ---------------------------------------------------------------------- OB History  Gravidity:    2         Term:   0        Prem:   0        SAB:   1  TOP:          0       Ectopic:  0        Living: 0  ---------------------------------------------------------------------- Gestational Age  Best:          23w 5d     Det. By:  Loman Chroman         EDD:   06/05/20                                      (11/14/19) ---------------------------------------------------------------------- Doppler - Fetal Vessels  Umbilical Artery   S/D     %tile                                              ADFV    RDFV      4       71                                                 No      No ---------------------------------------------------------------------- Cervix Uterus Adnexa  Cervix  Length:            0.7  cm.  Measured transvaginally. Funneling of internal os noted. ---------------------------------------------------------------------- Impression  Patient returns for cervical length measurement and umbilical  artery Doppler studies.  On ultrasound performed last week,  the cervix measured 2.3 cm long.  Patient was counseled and  progesterone was not prescribed as the cervical length was  greater than 2 cm.  The estimated fetal weight was at the 9th  percentile.  On ultrasound, amniotic fluid is normal and good fetal activity  seen.  Breech presentation.  Umbilical artery Doppler showed  normal forward diastolic flow.  We performed a transvaginal  ultrasound to evaluate the cervix.  On transfundal pressure,  large funneling was seen in the residual closed portion of the  cervix measured 7 mm.  After explaining, I performed a sterile  speculum examination.  The external os is closed.  On digital  examination, the cervix was 2 cm long  and the external os  was closed.  I counseled the patient on the findings with the help of  ultrasound images and diagrams, and explained that cervical  incompetence is associated with increased risk for preterm  delivery.  I discussed the benefit of antenatal corticosteroids  now.  I also recommended vaginal progesterone daily starting  today till [redacted] weeks gestation.  I recommended admission for  antenatal corticosteroids.  In  the absence of symptoms, sterile speculum examination will  be performed after 48 hours and if the external os remains  closed, outpatient management can be considered.  Patient agreed with my recommendations.  I discussed the  findings and recommendations with Dr. Jimmye Norman (attending  OB).  Patient will be going over [facility], [facility]. ---------------------------------------------------------------------- Recommendations  -Fetal growth assessment and umbilical artery Doppler  studies in 2 weeks. ----------------------------------------------------------------------                  Tama High, MD Electronically Signed Final Report   02/12/2020 05:51 pm ----------------------------------------------------------------------  [place] MFM OB Transvaginal  Result Date: 02/06/2020 ----------------------------------------------------------------------  OBSTETRICS REPORT                       (Signed Final 02/06/2020 11:33 am) ---------------------------------------------------------------------- Patient Info  ID #:       [id number]                          D.O.B.:  1990-11-05 (29 yrs)  Name:       [facility]                  Visit Date: 02/06/2020 09:56 am              [facility] ---------------------------------------------------------------------- Performed By  Attending:        Sander Nephew      Ref. Address:     [place]  Performed By:     Novella Rob        [facility]                                     [facility] at                                                             [place] [facility]                                                              Women  Referred By:      [facility] ---------------------------------------------------------------------- Orders  #  Description                           Code        Ordered By  1  [place] MFM OB FOLLOW UP                   76226.33    Sander Nephew  2  [place] MFM UA CORD DOPPLER                76820.02    Sander Nephew  3  [place] MFM OB TRANSVAGINAL                35456.2     Sander Nephew ----------------------------------------------------------------------  #  Order #                     Accession #                Episode #  1  [id number]                   [id number]  2  [id number]                   [id number]                 [id number]  3  [id number]                   [id number]                 [id number] ---------------------------------------------------------------------- Indications  Cervical shortening, second trimester          O26.872  Maternal care for known or suspected poor      O36.5920  fetal growth, second trimester, not applicable  or unspecified IUGR  Encounter for other antenatal screening        Z36.2  follow-up  (low risk NIPS)  Uterine fibroids affecting pregnancy in        O34.12, D25.9  second trimester, antepartum  Abnormal finding on antenatal screening        O28.9  (elevated AFP 2.84)  Genetic carrier (SMA)                          Z14.8  [redacted] weeks gestation of pregnancy                Z3A.22 ---------------------------------------------------------------------- Fetal Evaluation  Num Of Fetuses:         1  Fetal Heart Rate(bpm):  154  Cardiac Activity:       Observed  Presentation:           Breech  Placenta:               Anterior  P. Cord Insertion:      Previously Visualized  Amniotic Fluid  AFI FV:      Within normal limits                              Largest  Pocket(cm)                              4 ---------------------------------------------------------------------- Biometry  BPD:      48.7  mm     G. Age:  20w 5d          1  %    CI:         64.5   %    70 - 86                                                          FL/HC:      18.6   %    19.2 - 20.8  HC:       195   mm     G. Age:  21w 5d          5  %    HC/AC:      1.12        1.05 - 1.21  AC:      173.8  mm     G. Age:  22w 2d         25  %    FL/BPD:  74.5   %    71 - 87  FL:       36.3  mm     G. Age:  21w 4d          7  %    FL/AC:      20.9   %    20 - 24  LV:        3.6  mm  Est. FW:     458  gm           1 lb      9  % ---------------------------------------------------------------------- OB History  Gravidity:    2         Term:   0        Prem:   0        SAB:   1  TOP:          0       Ectopic:  0        Living: 0 ---------------------------------------------------------------------- Gestational Age  U/S Today:     21w 4d                                        EDD:   06/14/20  Best:          22w 6d     Det. ByLoman Chroman         EDD:   06/05/20                                      (11/14/19) ---------------------------------------------------------------------- Anatomy  Cranium:               Appears normal         LVOT:                   Previously seen  Cavum:                 Appears normal         Aortic Arch:            Previously seen  Ventricles:            Appears normal         Ductal Arch:            Previously seen  Choroid Plexus:        Bilateral CPC prev     Diaphragm:              Appears normal                         seen  Cerebellum:            Previously seen        Stomach:                Appears normal, left  sided  Posterior Fossa:       Previously seen        Abdomen:                Appears normal  Nuchal Fold:           Previously seen        Abdominal Wall:         Previously seen  Face:                   Orbits and profile     Cord Vessels:           Previously seen                         previously seen  Lips:                  Previously seen        Kidneys:                Appear normal  Palate:                Previously seen        Bladder:                Appears normal  Thoracic:              Appears normal         Spine:                  Previously seen  Heart:                 Appears normal         Upper Extremities:      Previously seen                         (4CH, axis, and                         situs)  RVOT:                  Previously seen        Lower Extremities:      Previously seen  Other:  Heels visualized. Nasal bone visualized. ---------------------------------------------------------------------- Doppler - Fetal Vessels  Umbilical Artery   S/D     %tile                                              ADFV    RDFV   7.75   > 97.5                                                 No      No ---------------------------------------------------------------------- Cervix Uterus Adnexa  Cervix  Length:            2.3  cm.  Measured transvaginally.  Uterus  Multiple fibroids noted, see table below. ---------------------------------------------------------------------- Myomas  Site                     L(cm)      W(cm)  D(cm)       Location  Anterior                 6.6        4.9        5.8  Fundus                   4.7        4.5        4.6 ----------------------------------------------------------------------  Blood Flow                  RI       PI       Comments ---------------------------------------------------------------------- Impression  Single intrauterine pregnancy here for a follow up growth and  anatomy with and elevated AFP  Normal anatomy with measurements less than dates due to  IUGR EFW 9th%.  There is good fetal movement and amniotic fluid volume  The UA Dopplers are elevated, that is trending toward AEDF  however, we did not observe intermittent AEDF.  In additions, we observed a  shortened a CL of 2 cm. The  cervix was not dynamic. She does not have a prior preterm  birth. She had a SAB at [redacted] weeks gestation.  I discussed today's visit with a diagnosis of IUGR. I explained  that the etiology includes placental insufficiency, chronic  disease, infection, aneuploidy and other genetic syndromes.  She has a low risk NIPS.Marland Kitchen She has no additional risk factors  for chronic disease. however her additional risk factor is an  elevated AFP which she was preveiously counseled.  At this  time I explained the diagnosis, evaluation and management  to include on going fetal growth and weekly antenatal testing  to include UA Dopplers. If the EFW < 3rd% or abnormal  testing, I recommend delivery at 37 weeks otherwise if all is  normal consider delivery at 39 weeks.  Lastly, I discussed the increased risk for preterm birth with a  cervix of <2.0 cm, requiring progesterone therapy. This  decision was postponed given her CL of 2.3 cm. However, we  have scheduled her to return in 1 week for evaluation.  All questions answered. ---------------------------------------------------------------------- Recommendations  Continue weekly UA Dopplers  Consder BMZ if UA Dopplers become AEDF  Repeat CL in 1 week. ----------------------------------------------------------------------               Sander Nephew, MD Electronically Signed Final Report   02/06/2020 11:33 am ----------------------------------------------------------------------  [place] MFM OB FOLLOW UP  Result Date: 02/06/2020 ----------------------------------------------------------------------  OBSTETRICS REPORT                       (Signed Final 02/06/2020 11:33 am) ---------------------------------------------------------------------- Patient Info  ID #:       [id number]                          D.O.B.:  [birth year] (29 yrs)  Name:       Sally Taylor                  Visit Date: 02/06/2020 09:56 am              [facility]  ---------------------------------------------------------------------- Performed By  Attending:        Sander Nephew      Ref. Address:     [address]  [address]                                                             [address]                                                             [address]                                                             [place]  Performed By:     Novella Rob        [facility]                    RD[facility]                                     [facility] at                                                             [place] for                                                             Women  Referred By:      [facility] ---------------------------------------------------------------------- Orders  #  Description                           Code        Ordered By  1  [place] MFM OB FOLLOW UP                   59563.87    CORENTHIAN                                                       BOOKER  2  [place] MFM  UA CORD DOPPLER                76820.02    Sander Nephew  3  [place] MFM OB TRANSVAGINAL                67209.4     Sander Nephew ----------------------------------------------------------------------  #  Order #                     Accession #                Episode #  1  [id number]                   [id number]                 [id number]  2  [id number]                   [id number]                 [id number]  3  [id number]                   [id number]                 [id number] ---------------------------------------------------------------------- Indications  Cervical shortening, second trimester          O26.872  Maternal care for known or suspected poor      O36.5920  fetal growth, second trimester, not applicable  or unspecified IUGR  Encounter for other antenatal  screening        Z36.2  follow-up  (low risk NIPS)  Uterine fibroids affecting pregnancy in        O34.12, D25.9  second trimester, antepartum  Abnormal finding on antenatal screening        O28.9  (elevated AFP 2.84)  Genetic carrier (SMA)                          Z14.8  [redacted] weeks gestation of pregnancy                Z3A.22 ---------------------------------------------------------------------- Fetal Evaluation  Num Of Fetuses:         1  Fetal Heart Rate(bpm):  154  Cardiac Activity:       Observed  Presentation:           Breech  Placenta:               Anterior  P. Cord Insertion:      Previously Visualized  Amniotic Fluid  AFI FV:      Within normal limits                              Largest Pocket(cm)  4 ---------------------------------------------------------------------- Biometry  BPD:      48.7  mm     G. Age:  20w 5d          1  %    CI:         64.5   %    70 - 86                                                          FL/HC:      18.6   %    19.2 - 20.8  HC:       195   mm     G. Age:  21w 5d          5  %    HC/AC:      1.12        1.05 - 1.21  AC:      173.8  mm     G. Age:  22w 2d         25  %    FL/BPD:     74.5   %    71 - 87  FL:       36.3  mm     G. Age:  21w 4d          7  %    FL/AC:      20.9   %    20 - 24  LV:        3.6  mm  Est. FW:     458  gm           1 lb      9  % ---------------------------------------------------------------------- OB History  Gravidity:    2         Term:   0        Prem:   0        SAB:   1  TOP:          0       Ectopic:  0        Living: 0 ---------------------------------------------------------------------- Gestational Age  U/S Today:     21w 4d                                        EDD:   06/14/20  Best:          22w 6d     Det. ByLoman Chroman         EDD:   06/05/20                                      (11/14/19) ---------------------------------------------------------------------- Anatomy  Cranium:                Appears normal         LVOT:                   Previously seen  Cavum:                 Appears normal  Aortic Arch:            Previously seen  Ventricles:            Appears normal         Ductal Arch:            Previously seen  Choroid Plexus:        Bilateral CPC prev     Diaphragm:              Appears normal                         seen  Cerebellum:            Previously seen        Stomach:                Appears normal, left                                                                        sided  Posterior Fossa:       Previously seen        Abdomen:                Appears normal  Nuchal Fold:           Previously seen        Abdominal Wall:         Previously seen  Face:                  Orbits and profile     Cord Vessels:           Previously seen                         previously seen  Lips:                  Previously seen        Kidneys:                Appear normal  Palate:                Previously seen        Bladder:                Appears normal  Thoracic:              Appears normal         Spine:                  Previously seen  Heart:                 Appears normal         Upper Extremities:      Previously seen                         (4CH, axis, and                         situs)  RVOT:  Previously seen        Lower Extremities:      Previously seen  Other:  Heels visualized. Nasal bone visualized. ---------------------------------------------------------------------- Doppler - Fetal Vessels  Umbilical Artery   S/D     %tile                                              ADFV    RDFV   7.75   > 97.5                                                 No      No ---------------------------------------------------------------------- Cervix Uterus Adnexa  Cervix  Length:            2.3  cm.  Measured transvaginally.  Uterus  Multiple fibroids noted, see table below. ---------------------------------------------------------------------- Myomas  Site                      L(cm)      W(cm)      D(cm)       Location  Anterior                 6.6        4.9        5.8  Fundus                   4.7        4.5        4.6 ----------------------------------------------------------------------  Blood Flow                  RI       PI       Comments ---------------------------------------------------------------------- Impression  Single intrauterine pregnancy here for a follow up growth and  anatomy with and elevated AFP  Normal anatomy with measurements less than dates due to  IUGR EFW 9th%.  There is good fetal movement and amniotic fluid volume  The UA Dopplers are elevated, that is trending toward AEDF  however, we did not observe intermittent AEDF.  In additions, we observed a shortened a CL of 2 cm. The  cervix was not dynamic. She does not have a prior preterm  birth. She had a SAB at [redacted] weeks gestation.  I discussed today's visit with a diagnosis of IUGR. I explained  that the etiology includes placental insufficiency, chronic  disease, infection, aneuploidy and other genetic syndromes.  She has a low risk NIPS.Marland Kitchen She has no additional risk factors  for chronic disease. however her additional risk factor is an  elevated AFP which she was preveiously counseled.  At this  time I explained the diagnosis, evaluation and management  to include on going fetal growth and weekly antenatal testing  to include UA Dopplers. If the EFW < 3rd% or abnormal  testing, I recommend delivery at 37 weeks otherwise if all is  normal consider delivery at 39 weeks.  Lastly, I discussed the increased risk for preterm birth with a  cervix of <2.0 cm, requiring progesterone therapy. This  decision was postponed given her CL of 2.3 cm. However, we  have scheduled her to return in 1 week for evaluation.  All questions answered. ----------------------------------------------------------------------  Recommendations  Continue weekly UA Dopplers  Consder BMZ if UA Dopplers become AEDF  Repeat CL in 1 week.  ----------------------------------------------------------------------               Sander Nephew, MD Electronically Signed Final Report   02/06/2020 11:33 am ----------------------------------------------------------------------  [place] MFM OB LIMITED  Result Date: 02/12/2020 ----------------------------------------------------------------------  OBSTETRICS REPORT                       (Signed Final 02/12/2020 05:51 pm) ---------------------------------------------------------------------- Patient Info  ID #:       [id number]                          D.O.B.:  1990/10/07 (29 yrs)  Name:       Sally Taylor                  Visit Date: 02/12/2020 05:15 pm              [facility] ---------------------------------------------------------------------- Performed By  Attending:        Tama High MD        Ref. Address:     [place]                                                             [place]  Performed By:     Rodrigo Ran BS      [facility]                                 [facility]                                                             [place] [facility]  Referred By:      [place] ---------------------------------------------------------------------- Orders  #  Description                           Code        Ordered By  1  [place] MFM UA CORD DOPPLER                76820.02    CORENTHIAN                                                       BOOKER  2  [place] MFM OB TRANSVAGINAL                32671.2     Sander Nephew  3  [place] MFM OB LIMITED                     45809.98    Sander Nephew ----------------------------------------------------------------------  #  Order #                     Accession #                Episode #  1  [id number]                   [id number]                 [id number]  2  [id number]                   [id number]                 [id number]  3  [id number]                   [id number]                 [id number] ---------------------------------------------------------------------- Indications  Encounter for other antenatal screening        Z36.2  follow-up  (low risk NIPS)  Cervical shortening, second trimester          O26.872  Maternal care for known or suspected poor      O36.5920  fetal growth, second trimester, not applicable  or unspecified IUGR  Uterine fibroids affecting pregnancy in        O34.12, D25.9  second trimester, antepartum  Abnormal finding on antenatal screening        O28.9  (elevated AFP 2.84)  Genetic carrier (SMA)  Z14.8  [redacted] weeks gestation of pregnancy                Z3A.23 ---------------------------------------------------------------------- Fetal Evaluation  Num Of Fetuses:         1  Fetal Heart Rate(bpm):  166  Cardiac Activity:       Observed  Presentation:           Breech  Amniotic Fluid  AFI FV:      Within normal limits                              Largest Pocket(cm)                              6.5 ---------------------------------------------------------------------- OB History  Gravidity:    2         Term:   0        Prem:   0        SAB:   1  TOP:          0       Ectopic:  0        Living: 0 ---------------------------------------------------------------------- Gestational Age  Best:          23w 5d     Det. By:  Loman Chroman         EDD:   06/05/20                                      (11/14/19) ---------------------------------------------------------------------- Doppler - Fetal Vessels  Umbilical Artery   S/D     %tile                                              ADFV    RDFV      4        71                                                 No      No ---------------------------------------------------------------------- Cervix Uterus Adnexa  Cervix  Length:            0.7  cm.  Measured transvaginally. Funneling of internal os noted. ---------------------------------------------------------------------- Impression  Patient returns for cervical length measurement and umbilical  artery Doppler studies.  On ultrasound performed last week,  the cervix measured 2.3 cm long.  Patient was counseled and  progesterone was not prescribed as the cervical length was  greater than 2 cm.  The estimated fetal weight was at the 9th  percentile.  On ultrasound, amniotic fluid is normal and good fetal activity  seen.  Breech presentation.  Umbilical artery Doppler showed  normal forward diastolic flow.  We performed a transvaginal  ultrasound to evaluate the cervix.  On transfundal pressure,  large funneling was seen in the residual closed portion of the  cervix measured 7 mm.  After explaining, I performed a sterile  speculum examination.  The external os is closed.  On digital  examination, the cervix was 2 cm long  and the external os  was closed.  I counseled the patient on the findings with the help of  ultrasound images and diagrams, and explained that cervical  incompetence is associated with increased risk for preterm  delivery.  I discussed the benefit of antenatal corticosteroids  now.  I also recommended vaginal progesterone daily starting  today till [redacted] weeks gestation.  I recommended admission for antenatal corticosteroids.  In  the absence of symptoms, sterile speculum examination will  be performed after 48 hours and if the external os remains  closed, outpatient management can be considered.  Patient agreed with my recommendations.  I discussed the  findings and recommendations with Dr. Jimmye Norman (attending  OB).  Patient will be going over to [facility] hospital, [facility].  ---------------------------------------------------------------------- Recommendations  -Fetal growth assessment and umbilical artery Doppler  studies in 2 weeks. ----------------------------------------------------------------------                  Tama High, MD Electronically Signed Final Report   02/12/2020 05:51 pm ----------------------------------------------------------------------  [place] MFM UA CORD DOPPLER  Result Date: 02/12/2020 ----------------------------------------------------------------------  OBSTETRICS REPORT                       (Signed Final 02/12/2020 05:51 pm) ---------------------------------------------------------------------- Patient Info  ID #:       [id number]                          D.O.B.:  28-Apr-1991 (29 yrs)  Name:       Sally Taylor                  Visit Date: 02/12/2020 05:15 pm              [facility] ---------------------------------------------------------------------- Performed By  Attending:        Tama High MD        Ref. Address:     [place]                                                             [place]  Performed By:     Rodrigo Ran BS  Location[facility]                    RD[facility]                                 [facility] at                                                             [place] [facility]                                                             [facility]  Referred By:      [place] ---------------------------------------------------------------------- Orders  #  Description                           Code        Ordered By  1  [place] MFM UA CORD DOPPLER                76820.02    Sander Nephew  2  [place] MFM OB TRANSVAGINAL                76817.2      Sander Nephew  3  [place] MFM OB LIMITED                     76815.01    Sander Nephew ----------------------------------------------------------------------  #  Order #                     Accession #                Episode #  1  [id number]                   [id number]                 [id number]  2  [id number]  [id number]                 [id number]  3  [id number]                   [id number]                 [id number] ---------------------------------------------------------------------- Indications  Encounter for other antenatal screening        Z36.2  follow-up  (low risk NIPS)  Cervical shortening, second trimester          O26.872  Maternal care for known or suspected poor      O36.5920  fetal growth, second trimester, not applicable  or unspecified IUGR  Uterine fibroids affecting pregnancy in        O34.12, D25.9  second trimester, antepartum  Abnormal finding on antenatal screening        O28.9  (elevated AFP 2.84)  Genetic carrier (SMA)                          Z14.8  [redacted] weeks gestation of pregnancy                Z3A.23 ---------------------------------------------------------------------- Fetal Evaluation  Num Of Fetuses:         1  Fetal Heart Rate(bpm):  166  Cardiac Activity:       Observed  Presentation:           Breech  Amniotic Fluid  AFI FV:      Within normal limits                              Largest Pocket(cm)                              6.5 ---------------------------------------------------------------------- OB History  Gravidity:    2         Term:   0        Prem:   0        SAB:   1  TOP:          0       Ectopic:  0        Living: 0 ---------------------------------------------------------------------- Gestational Age  Best:          23w 5d     Det. ByLoman Chroman         EDD:   06/05/20                                      (11/14/19)  ---------------------------------------------------------------------- Doppler - Fetal Vessels  Umbilical Artery   S/D     %tile                                              ADFV    RDFV      4       71  No      No ---------------------------------------------------------------------- Cervix Uterus Adnexa  Cervix  Length:            0.7  cm.  Measured transvaginally. Funneling of internal os noted. ---------------------------------------------------------------------- Impression  Patient returns for cervical length measurement and umbilical  artery Doppler studies.  On ultrasound performed last week,  the cervix measured 2.3 cm long.  Patient was counseled and  progesterone was not prescribed as the cervical length was  greater than 2 cm.  The estimated fetal weight was at the 9th  percentile.  On ultrasound, amniotic fluid is normal and good fetal activity  seen.  Breech presentation.  Umbilical artery Doppler showed  normal forward diastolic flow.  We performed a transvaginal  ultrasound to evaluate the cervix.  On transfundal pressure,  large funneling was seen in the residual closed portion of the  cervix measured 7 mm.  After explaining, I performed a sterile  speculum examination.  The external os is closed.  On digital  examination, the cervix was 2 cm long and the external os  was closed.  I counseled the patient on the findings with the help of  ultrasound images and diagrams, and explained that cervical  incompetence is associated with increased risk for preterm  delivery.  I discussed the benefit of antenatal corticosteroids  now.  I also recommended vaginal progesterone daily starting  today till [redacted] weeks gestation.  I recommended admission for antenatal corticosteroids.  In  the absence of symptoms, sterile speculum examination will  be performed after 48 hours and if the external os remains  closed, outpatient management can be considered.  Patient agreed  with my recommendations.  I discussed the  findings and recommendations with Dr. Jimmye Norman (attending  OB).  Patient will be going over to [facility], [facility]. ---------------------------------------------------------------------- Recommendations  -Fetal growth assessment and umbilical artery Doppler  studies in 2 weeks. ----------------------------------------------------------------------                  Tama High, MD Electronically Signed Final Report   02/12/2020 05:51 pm ----------------------------------------------------------------------  [place] MFM UA CORD DOPPLER  Result Date: 02/06/2020 ----------------------------------------------------------------------  OBSTETRICS REPORT                       (Signed Final 02/06/2020 11:33 am) ---------------------------------------------------------------------- Patient Info  ID #:       [id number]                          D.O.B.:  Aug 29, 1990 (29 yrs)  Name:       [facility]                  Visit Date: 02/06/2020 09:56 am              [facility] ---------------------------------------------------------------------- Performed By  Attending:        Sander Nephew      Ref. Address:     [place]  [address] [place]                                                             [place]  Performed By:     Novella Rob        Location[facility]                    RDMS                                     [facility] at                                                             [place] for                                                             Women  Referred By:      [place] ---------------------------------------------------------------------- Orders  #  Description                           Code        Ordered By   1  [place] MFM OB FOLLOW UP                   17001.74    Sander Nephew  2  [place] MFM UA CORD DOPPLER                76820.02    Sander Nephew  3  [place] MFM OB TRANSVAGINAL                [id number]     CORENTHIAN  BOOKER ----------------------------------------------------------------------  #  Order #                     Accession #                Episode #  1  [id number]                   [id number]                 [id number]  2  [id number]                   [id number]                 [id number]  3  [id number]                   [id number]                 [id number] ---------------------------------------------------------------------- Indications  Cervical shortening, second trimester          O26.872  Maternal care for known or suspected poor      O36.5920  fetal growth, second trimester, not applicable  or unspecified IUGR  Encounter for other antenatal screening        Z36.2  follow-up  (low risk NIPS)  Uterine fibroids affecting pregnancy in        O34.12, D25.9  second trimester, antepartum  Abnormal finding on antenatal screening        O28.9  (elevated AFP 2.84)  Genetic carrier (SMA)                          Z14.8  [redacted] weeks gestation of pregnancy                Z3A.22 ---------------------------------------------------------------------- Fetal Evaluation  Num Of Fetuses:         1  Fetal Heart Rate(bpm):  154  Cardiac Activity:       Observed  Presentation:           Breech  Placenta:               Anterior  P. Cord Insertion:      Previously Visualized  Amniotic Fluid  AFI FV:      Within normal limits                              Largest Pocket(cm)                              4 ---------------------------------------------------------------------- Biometry  BPD:      48.7  mm     G. Age:  20w 5d          1  %    CI:         64.5   %    70 - 86                                                           FL/HC:      18.6   %    19.2 - 20.8  HC:  195   mm     G. Age:  21w 5d          5  %    HC/AC:      1.12        1.05 - 1.21  AC:      173.8  mm     G. Age:  22w 2d         25  %    FL/BPD:     74.5   %    71 - 87  FL:       36.3  mm     G. Age:  21w 4d          7  %    FL/AC:      20.9   %    20 - 24  LV:        3.6  mm  Est. FW:     458  gm           1 lb      9  % ---------------------------------------------------------------------- OB History  Gravidity:    2         Term:   0        Prem:   0        SAB:   1  TOP:          0       Ectopic:  0        Living: 0 ---------------------------------------------------------------------- Gestational Age  U/S Today:     21w 4d                                        EDD:   06/14/20  Best:          22w 6d     Det. ByLoman Chroman         EDD:   06/05/20                                      (11/14/19) ---------------------------------------------------------------------- Anatomy  Cranium:               Appears normal         LVOT:                   Previously seen  Cavum:                 Appears normal         Aortic Arch:            Previously seen  Ventricles:            Appears normal         Ductal Arch:            Previously seen  Choroid Plexus:        Bilateral CPC prev     Diaphragm:              Appears normal                         seen  Cerebellum:            Previously seen        Stomach:  Appears normal, left                                                                        sided  Posterior Fossa:       Previously seen        Abdomen:                Appears normal  Nuchal Fold:           Previously seen        Abdominal Wall:         Previously seen  Face:                  Orbits and profile     Cord Vessels:           Previously seen                         previously seen  Lips:                  Previously seen        Kidneys:                Appear normal  Palate:                 Previously seen        Bladder:                Appears normal  Thoracic:              Appears normal         Spine:                  Previously seen  Heart:                 Appears normal         Upper Extremities:      Previously seen                         (4CH, axis, and                         situs)  RVOT:                  Previously seen        Lower Extremities:      Previously seen  Other:  Heels visualized. Nasal bone visualized. ---------------------------------------------------------------------- Doppler - Fetal Vessels  Umbilical Artery   S/D     %tile                                              ADFV    RDFV   7.75   > 97.5  No      No ---------------------------------------------------------------------- Cervix Uterus Adnexa  Cervix  Length:            2.3  cm.  Measured transvaginally.  Uterus  Multiple fibroids noted, see table below. ---------------------------------------------------------------------- Myomas  Site                     L(cm)      W(cm)      D(cm)       Location  Anterior                 6.6        4.9        5.8  Fundus                   4.7        4.5        4.6 ----------------------------------------------------------------------  Blood Flow                  RI       PI       Comments ---------------------------------------------------------------------- Impression  Single intrauterine pregnancy here for a follow up growth and  anatomy with and elevated AFP  Normal anatomy with measurements less than dates due to  IUGR EFW 9th%.  There is good fetal movement and amniotic fluid volume  The UA Dopplers are elevated, that is trending toward AEDF  however, we did not observe intermittent AEDF.  In additions, we observed a shortened a CL of 2 cm. The  cervix was not dynamic. She does not have a prior preterm  birth. She had a SAB at [redacted] weeks gestation.  I discussed today's visit with a diagnosis of IUGR. I explained  that the etiology  includes placental insufficiency, chronic  disease, infection, aneuploidy and other genetic syndromes.  She has a low risk NIPS.Marland Kitchen She has no additional risk factors  for chronic disease. however her additional risk factor is an  elevated AFP which she was preveiously counseled.  At this  time I explained the diagnosis, evaluation and management  to include on going fetal growth and weekly antenatal testing  to include UA Dopplers. If the EFW < 3rd% or abnormal  testing, I recommend delivery at 37 weeks otherwise if all is  normal consider delivery at 39 weeks.  Lastly, I discussed the increased risk for preterm birth with a  cervix of <2.0 cm, requiring progesterone therapy. This  decision was postponed given her CL of 2.3 cm. However, we  have scheduled her to return in 1 week for evaluation.  All questions answered. ---------------------------------------------------------------------- Recommendations  Continue weekly UA Dopplers  Consder BMZ if UA Dopplers become AEDF  Repeat CL in 1 week. ----------------------------------------------------------------------               Lin Landsman, MD Electronically Signed Final Report   02/06/2020 11:33 am ----------------------------------------------------------------------    Assessment and Plan: Patient Active Problem List   Diagnosis Date Noted  . Short cervix during pregnancy in third trimester 02/12/2020  . Pelvic pain affecting pregnancy in second trimester, antepartum 02/07/2020  . IUGR (intrauterine growth restriction) affecting care of mother 02/06/2020  . Transient hypertension of pregnancy, antepartum 12/22/2019  . 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 Supervision of other normal pregnancy, antepartum 10/31/2019  . History of ectopic pregnancy 08/28/2019   Admit to Antenatal Betamethasone x 2 doses Per MFM too late for cerclage, start vaginal progesterone.  Plan for  SSE to reassess cervix, if visually closed Wed morning, okay for D/c home.  Per MFM.  Routine antenatal care  Juanna Cao, MD, [place] Attending [place], [facility]

## 2020-02-13 NOTE — Progress Notes (Signed)
FACULTY PRACTICE ANTEPARTUM PROGRESS NOTE  Sally Taylor is a 29 y.o. G2P0010 at [redacted]w[redacted]d who is admitted for shortened cervix with funneling.  Estimated Date of Delivery: 06/05/20 Fetal presentation is breech.  Length of Stay:  1 Days. Admitted 02/12/2020  Subjective:  Patient reports normal fetal movement.  She denies uterine contractions, denies bleeding and leaking of fluid per vagina. She is feeling well with no issues.  Vitals:  Blood pressure (!) 133/68, pulse 77, temperature 98.2 F (36.8 C), temperature source Oral, resp. rate 18, height 5\' 2"  (1.575 m), weight (!) 98.6 kg, SpO2 99 %, unknown if currently breastfeeding. Physical Examination: CONSTITUTIONAL: Well-developed, well-nourished female in no acute distress.  HENT:  Normocephalic, atraumatic, External right and left ear normal. Oropharynx is clear and moist EYES: Conjunctivae and EOM are normal. Pupils are equal, round, and reactive to light. No scleral icterus.  NECK: Normal range of motion, supple, no masses. SKIN: Skin is warm and dry. No rash noted. Not diaphoretic. No erythema. No pallor. [place]: Alert and oriented to person, place, and time. Normal reflexes, muscle tone coordination. No cranial nerve deficit noted. PSYCHIATRIC: Normal mood and affect. Normal behavior. Normal judgment and thought content. CARDIOVASCULAR: Normal heart rate noted RESPIRATORY: Effort normal, no problems with respiration noted MUSCULOSKELETAL: Normal range of motion. No edema and no tenderness. ABDOMEN: Soft, nontender, nondistended, gravid. CERVIX: deferred  Fetal monitoring: FHR: 128 bpm   Results for orders placed or performed during the hospital encounter of 02/12/20 (from the past 48 hour(s))  Type and screen [place]     Status: None   Collection Time: 02/12/20  8:58 PM  Result Value Ref Range   ABO/RH(D) O POS    Antibody Screen NEG    Sample Expiration      02/15/2020,2359 Performed at  [facility] [address], [place], [place] [address]   CBC on admission     Status: Abnormal   Collection Time: 02/12/20 10:26 PM  Result Value Ref Range   WBC 11.4 (H) 4.0 - 10.5 K/uL   RBC 4.06 3.87 - 5.11 MIL/uL   Hemoglobin 10.8 (L) 12.0 - 15.0 g/dL   HCT 33.9 (L) 36 - 46 %   MCV 83.5 80.0 - 100.0 fL   MCH 26.6 26.0 - 34.0 pg   MCHC 31.9 30.0 - 36.0 g/dL   RDW 13.7 11.5 - 15.5 %   Platelets 444 (H) 150 - 400 K/uL   nRBC 0.0 0.0 - 0.2 %    Comment: Performed at [facility] [address] [place], [place] [address]    I have reviewed the patient's current medications.  ASSESSMENT: Active Problems:   Short cervix during pregnancy in third trimester   PLAN: Monitor in house until completion of BTMZ [place] exam tomorrow am and if remains closed, discharge to home Cont vag progesterone  Reviewed course with patient, prognosis with early delivery, outcomes improve the further along she can get in pregnancy, pelvic rest until end of pregnancy, pregnancy precautions (no heavy lifting, etc), return to hospital with any cramping/contractions. She verbalized understanding and is in agreement with plan.   Continue routine antenatal care.   Feliz Beam, M.D. Attending [facility] [employer] [facility] (Faculty Practice)  02/13/2020 11:33 AM

## 2020-02-14 ENCOUNTER — Encounter: Payer: Medicaid Other | Admitting: Advanced Practice Midwife

## 2020-02-14 DIAGNOSIS — Z3A24 24 weeks gestation of pregnancy: Secondary | ICD-10-CM

## 2020-02-14 DIAGNOSIS — O26873 Cervical shortening, third trimester: Secondary | ICD-10-CM | POA: Diagnosis not present

## 2020-02-14 DIAGNOSIS — O365920 Maternal care for other known or suspected poor fetal growth, second trimester, not applicable or unspecified: Secondary | ICD-10-CM

## 2020-02-14 DIAGNOSIS — O26872 Cervical shortening, second trimester: Secondary | ICD-10-CM | POA: Diagnosis not present

## 2020-02-14 DIAGNOSIS — I10 Essential (primary) hypertension: Secondary | ICD-10-CM | POA: Diagnosis present

## 2020-02-14 DIAGNOSIS — O10912 Unspecified pre-existing hypertension complicating pregnancy, second trimester: Secondary | ICD-10-CM | POA: Diagnosis not present

## 2020-02-14 LAB — COMPREHENSIVE METABOLIC PANEL
ALT: 22 U/L (ref 0–44)
AST: 16 U/L (ref 15–41)
Albumin: 2.9 g/dL — ABNORMAL LOW (ref 3.5–5.0)
Alkaline Phosphatase: 55 U/L (ref 38–126)
Anion gap: 10 (ref 5–15)
BUN: 6 mg/dL (ref 6–20)
CO2: 23 mmol/L (ref 22–32)
Calcium: 9.2 mg/dL (ref 8.9–10.3)
Chloride: 103 mmol/L (ref 98–111)
Creatinine, Ser: 0.47 mg/dL (ref 0.44–1.00)
GFR calc Af Amer: >60 mL/min (ref >60)
GFR calc non Af Amer: >60 mL/min (ref >60)
Glucose, Bld: 109 mg/dL — ABNORMAL HIGH (ref 70–99)
Potassium: 3.6 mmol/L (ref 3.5–5.1)
Sodium: 136 mmol/L (ref 135–145)
Total Bilirubin: 0.3 mg/dL (ref 0.3–1.2)
Total Protein: 6.6 g/dL (ref 6.5–8.1)

## 2020-02-14 LAB — PROTEIN / CREATININE RATIO, URINE
Creatinine, Urine: 37.55 mg/dL
Total Protein, Urine: <6 mg/dL

## 2020-02-14 MED ORDER — PROGESTERONE 200 MG PO CAPS: 200.0000 mg | ORAL_CAPSULE | Freq: Every day | ORAL | 6 refills | Status: DC

## 2020-02-14 NOTE — Progress Notes (Signed)
Ambulated out teaching complete

## 2020-02-14 NOTE — Discharge Instructions (Signed)
Do not put anything into the vagina.

## 2020-02-14 NOTE — Discharge Summary (Signed)
Antenatal Physician Discharge Summary  Patient ID: Sally Taylor MRN: [id number] DOB/AGE: [birth year] 29 y.o.  Admit date: 02/12/2020 Discharge date: 02/14/2020  Admission Diagnoses: shortened cervix  Discharge Diagnoses:  Active Problems:   Transient hypertension of pregnancy, antepartum   IUGR (intrauterine growth restriction) affecting care of mother   Short cervix during pregnancy in third trimester   Chronic hypertension   Prenatal Procedures: NST  Consults: n/a  Hospital Course:  This is a 29 y.o. G2P0010 with IUP at 47w0dadmitted for shortened cervix noted on [place] found to have CL of 0.7 cm and funneling. She does not complain of contractions or cramping. She was admitted, monitored, [place] started [place] vaginal prometrium. Repeat sterile speculum exam shows closed cervical os at 48 hours. Since she has not had any symptoms and continues to report feeling well, she is discharged home. She was given promtetrium and pelvic precautions, to f/u in office. Has f/u scheduled with MFM. Fetal status reassuring.     She was deemed stable for discharge to home with outpatient follow up.  Discharge Exam: Temp:  [97.8 F (36.6 C)-98.4 F (36.9 C)] 98.4 F (36.9 C) (07/28 1133) Pulse Rate:  [76-87] 80 (07/28 1133) Resp:  [17-20] 17 (07/28 1133) BP: (124-143)/(68-87) 131/76 (07/28 1133) SpO2:  [99 %-100 %] 99 % (07/28 1133) Physical Examination: CONSTITUTIONAL: Well-developed, well-nourished female in no acute distress.  HENT:  Normocephalic, atraumatic, External right and left ear normal. Oropharynx is clear and moist EYES: Conjunctivae and EOM are normal. Pupils are equal, round, and reactive to light. No scleral icterus.  NECK: Normal range of motion, supple, no masses SKIN: Skin is warm and dry. No rash noted. Not diaphoretic. No erythema. No pallor. [place] Alert and oriented to person, place, and time. Normal reflexes, muscle tone coordination. No cranial  nerve deficit noted. PSYCHIATRIC: Normal mood and affect. Normal behavior. Normal judgment and thought content. CARDIOVASCULAR: Normal heart rate noted RESPIRATORY: Effort normal, no problems with respiration noted MUSCULOSKELETAL: Normal range of motion. No edema and no tenderness. 2+ distal pulses. ABDOMEN: Soft, nontender, nondistended, gravid. CERVIX:   SSE: closed  Fetal monitoring: FHR: 143 bpm  Significant Diagnostic Studies:  Results for orders placed or performed during the hospital encounter of 02/12/20 (from the past 168 hour(s))  Type and screen [place]  Collection Time: 02/12/20  8:58 PM  Result Value Ref Range   ABO/RH(D) O POS    Antibody Screen NEG    Sample Expiration      02/15/2020,2359 Performed at [id number] [place] [id number]  CBC on admission   Collection Time: 02/12/20 10:26 PM  Result Value Ref Range   WBC 11.4 (H) 4.0 - 10.5 K/uL   RBC 4.06 3.87 - 5.11 MIL/uL   Hemoglobin 10.8 (L) 12.0 - 15.0 g/dL   HCT 33.9 (L) 36 - 46 %   MCV 83.5 80.0 - 100.0 fL   MCH 26.6 26.0 - 34.0 pg   MCHC 31.9 30.0 - 36.0 g/dL   RDW 13.7 11.5 - 15.5 %   Platelets 444 (H) 150 - 400 K/uL   nRBC 0.0 0.0 - 0.2 %  Protein / creatinine ratio, urine   Collection Time: 02/14/20  9:24 AM  Result Value Ref Range   Creatinine, Urine 37.55 mg/dL   Total Protein, Urine <6 mg/dL   Protein Creatinine Ratio        0.00 - 0.15 mg/mg[Cre]  Comprehensive metabolic  panel   Collection Time: 02/14/20 10:31 AM  Result Value Ref Range   Sodium 136 135 - 145 mmol/L   Potassium 3.6 3.5 - 5.1 mmol/L   Chloride 103 98 - 111 mmol/L   CO2 23 22 - 32 mmol/L   Glucose, Bld 109 (H) 70 - 99 mg/dL   BUN 6 6 - 20 mg/dL   Creatinine, Ser 0.47 0.44 - 1.00 mg/dL   Calcium 9.2 8.9 - 10.3 mg/dL   Total Protein 6.6 6.5 - 8.1 g/dL   Albumin 2.9 (L) 3.5 - 5.0 g/dL   AST 16 15 - 41 U/L   ALT 22 0 - 44 U/L   Alkaline Phosphatase 55 38 - 126 U/L    Total Bilirubin 0.3 0.3 - 1.2 mg/dL   GFR calc non Af Amer >60 >60 mL/min   GFR calc Af Amer >60 >60 mL/min   Anion gap 10 5 - 15    Discharge Condition: Stable  Disposition: Discharge disposition: 01-Home or Self Care        Discharge Instructions    Discharge activity:  No Restrictions   Complete by: As directed    Do not have sex or do anything that might make you have an orgasm   Complete by: As directed    Notify physician for a general feeling that "something is not right"   Complete by: As directed    Notify physician for increase or change in vaginal discharge   Complete by: As directed    Notify physician for intestinal cramps, with or without diarrhea, sometimes described as "gas pain"   Complete by: As directed    Notify physician for leaking of fluid   Complete by: As directed    Notify physician for low, dull backache, unrelieved by heat or Tylenol   Complete by: As directed    Notify physician for menstrual like cramps   Complete by: As directed    Notify physician for pelvic pressure   Complete by: As directed    Notify physician for uterine contractions.  These may be painless and feel like the uterus is tightening or the baby is  "balling up"   Complete by: As directed    Notify physician for vaginal bleeding   Complete by: As directed    PRETERM LABOR:  Includes any of the follwing symptoms that occur between 20 - [redacted] weeks gestation.  If these symptoms are not stopped, preterm labor can result in preterm delivery, placing your baby at risk   Complete by: As directed      Allergies as of 02/14/2020   No Known Allergies     Medication List    STOP taking these medications   Comfort Fit Maternity Supp Lg Misc   cyclobenzaprine 10 MG tablet Commonly known as: FLEXERIL     TAKE these medications   acetaminophen 325 MG tablet Commonly known as: TYLENOL Take 3 tablets (975 mg total) by mouth every 6 (six) hours as needed for mild pain, moderate  pain or headache.   aspirin EC 81 MG tablet Take 1 tablet (81 mg total) by mouth daily. Take after 12 weeks for prevention of preeclampsia later in pregnancy   Blood Pressure Kit 1 kit by Does not apply route daily.   Blood Pressure Kit Devi 1 kit by Does not apply route once a week. Check Blood Pressure regularly and record readings into the Bab[employer]s App.  Large Cuff.  DX O90.0   PrePLUS 27-1 MG Tabs  Take 1 tablet by mouth daily.   progesterone 200 MG capsule Commonly known as: PROMETRIUM Place 1 capsule (200 mg total) vaginally at bedtime.       Follow-up Information    [facility]. Go to.   Specialty: Obstetrics and Gynecology Why: follow up appt Contact information: [address] [place] [phone]              Signed: Feliz Beam, M.D. Attending [facility] [employer] (Faculty Practice)  02/14/2020, 1:32 PM

## 2020-02-19 ENCOUNTER — Other Ambulatory Visit: Payer: Self-pay

## 2020-02-19 ENCOUNTER — Ambulatory Visit (INDEPENDENT_AMBULATORY_CARE_PROVIDER_SITE_OTHER): Payer: Medicaid Other | Admitting: Obstetrics and Gynecology

## 2020-02-19 VITALS — BP 132/84 | HR 85 | Wt 210.2 lb

## 2020-02-19 DIAGNOSIS — Z3480 Encounter for supervision of other normal pregnancy, unspecified trimester: Secondary | ICD-10-CM

## 2020-02-19 DIAGNOSIS — O365920 Maternal care for other known or suspected poor fetal growth, second trimester, not applicable or unspecified: Secondary | ICD-10-CM

## 2020-02-19 DIAGNOSIS — O26873 Cervical shortening, third trimester: Secondary | ICD-10-CM

## 2020-02-19 NOTE — Progress Notes (Signed)
   PRENATAL VISIT NOTE  Subjective:  Sally Taylor is a 29 y.o. G2P0010 at [redacted]w[redacted]d being seen today for ongoing prenatal care.  She is currently monitored for the following issues for this high-risk pregnancy and has History of ectopic pregnancy; Supervision of other normal pregnancy, antepartum; Genetic carrier status; Transient hypertension of pregnancy, antepartum; IUGR (intrauterine growth restriction) affecting care of mother; Pelvic pain affecting pregnancy in second trimester, antepartum; Short cervix during pregnancy in third trimester; and Chronic hypertension on their problem list.  Patient doing well with no acute concerns today. She reports no complaints.  Contractions: Not present. Vag. Bleeding: None.  Movement: Present. Denies leaking of fluid.   Pt was in the OB special care unit 7/26/-7/28 with cervical shortening , cervical length 0.7.  Pt received BMZ x 2 and was started on vaginal progesterone.  Currently she denies any s/sx of preterm labor.  The following portions of the patient's history were reviewed and updated as appropriate: allergies, current medications, past family history, past medical history, past social history, past surgical history and problem list. Problem list updated.  Objective:   Vitals:   02/19/20 1626  BP: 132/84  Pulse: 85  Weight: 210 lb 3.2 oz (95.3 kg)    Fetal Status: Fetal Heart Rate (bpm): 132   Movement: Present     General:  Alert, oriented and cooperative. Patient is in no acute distress.  Skin: Skin is warm and dry. No rash noted.   Cardiovascular: Normal heart rate noted  Respiratory: Normal respiratory effort, no problems with respiration noted  Abdomen: Soft, gravid, appropriate for gestational age.  Pain/Pressure: Absent     Pelvic: Cervical exam deferred        Extremities: Normal range of motion.  Edema: None  Mental Status:  Normal mood and affect. Normal behavior. Normal judgment and thought content.    Assessment and Plan:  Pregnancy: G2P0010 at [redacted]w[redacted]d  1. Supervision of other normal pregnancy, antepartum   2. Poor fetal growth affecting management of mother in second trimester, single or unspecified fetus  - [place] MFM OB FOLLOW UP; Future  3. Short cervix during pregnancy in second trimester Continue vaginal progesterone, discussed signs of preterm labor, goal is to get to at least 34 weeks, continue pelvic rest - [place] MFM OB FOLLOW UP; Future  Preterm labor symptoms and general obstetric precautions including but not limited to vaginal bleeding, contractions, leaking of fluid and fetal movement were reviewed in detail with the patient.  Please refer to After Visit Summary for other counseling recommendations.   Return in about 3 weeks (around 03/11/2020) for ROB, in person, 2 hr GTT, 28 week labs.   Lynnda Shields, MD

## 2020-02-19 NOTE — Patient Instructions (Signed)

## 2020-02-22 ENCOUNTER — Encounter: Payer: Medicaid Other | Admitting: Obstetrics & Gynecology

## 2020-02-26 ENCOUNTER — Ambulatory Visit: Payer: Medicaid Other | Attending: Obstetrics and Gynecology

## 2020-02-26 ENCOUNTER — Other Ambulatory Visit: Payer: Self-pay | Admitting: Obstetrics and Gynecology

## 2020-02-26 ENCOUNTER — Encounter: Payer: Self-pay | Admitting: *Deleted

## 2020-02-26 ENCOUNTER — Other Ambulatory Visit: Payer: Self-pay

## 2020-02-26 ENCOUNTER — Ambulatory Visit: Payer: Medicaid Other | Admitting: *Deleted

## 2020-02-26 DIAGNOSIS — Z3480 Encounter for supervision of other normal pregnancy, unspecified trimester: Secondary | ICD-10-CM | POA: Diagnosis present

## 2020-02-26 DIAGNOSIS — O365920 Maternal care for other known or suspected poor fetal growth, second trimester, not applicable or unspecified: Secondary | ICD-10-CM

## 2020-02-26 DIAGNOSIS — Z362 Encounter for other antenatal screening follow-up: Secondary | ICD-10-CM | POA: Diagnosis not present

## 2020-02-26 DIAGNOSIS — O289 Unspecified abnormal findings on antenatal screening of mother: Secondary | ICD-10-CM

## 2020-02-26 DIAGNOSIS — O3412 Maternal care for benign tumor of corpus uteri, second trimester: Secondary | ICD-10-CM

## 2020-02-26 DIAGNOSIS — O26873 Cervical shortening, third trimester: Secondary | ICD-10-CM | POA: Insufficient documentation

## 2020-02-26 DIAGNOSIS — Z3A25 25 weeks gestation of pregnancy: Secondary | ICD-10-CM

## 2020-02-26 DIAGNOSIS — O26892 Other specified pregnancy related conditions, second trimester: Secondary | ICD-10-CM | POA: Insufficient documentation

## 2020-02-26 DIAGNOSIS — D259 Leiomyoma of uterus, unspecified: Secondary | ICD-10-CM

## 2020-02-26 DIAGNOSIS — Z148 Genetic carrier of other disease: Secondary | ICD-10-CM

## 2020-02-26 DIAGNOSIS — R102 Pelvic and perineal pain: Secondary | ICD-10-CM | POA: Insufficient documentation

## 2020-02-26 DIAGNOSIS — O26872 Cervical shortening, second trimester: Secondary | ICD-10-CM

## 2020-02-27 ENCOUNTER — Other Ambulatory Visit: Payer: Self-pay | Admitting: *Deleted

## 2020-02-27 ENCOUNTER — Telehealth: Payer: Self-pay

## 2020-02-27 ENCOUNTER — Encounter: Payer: Self-pay | Admitting: Obstetrics

## 2020-02-27 DIAGNOSIS — O365990 Maternal care for other known or suspected poor fetal growth, unspecified trimester, not applicable or unspecified: Secondary | ICD-10-CM

## 2020-02-27 NOTE — Telephone Encounter (Signed)
Returned call, no answer, vm not set up

## 2020-03-07 ENCOUNTER — Ambulatory Visit: Payer: Medicaid Other | Admitting: *Deleted

## 2020-03-07 ENCOUNTER — Ambulatory Visit: Payer: Medicaid Other | Attending: Obstetrics and Gynecology

## 2020-03-07 ENCOUNTER — Other Ambulatory Visit: Payer: Self-pay

## 2020-03-07 DIAGNOSIS — Z362 Encounter for other antenatal screening follow-up: Secondary | ICD-10-CM

## 2020-03-07 DIAGNOSIS — D259 Leiomyoma of uterus, unspecified: Secondary | ICD-10-CM

## 2020-03-07 DIAGNOSIS — Z3480 Encounter for supervision of other normal pregnancy, unspecified trimester: Secondary | ICD-10-CM

## 2020-03-07 DIAGNOSIS — O365920 Maternal care for other known or suspected poor fetal growth, second trimester, not applicable or unspecified: Secondary | ICD-10-CM | POA: Diagnosis not present

## 2020-03-07 DIAGNOSIS — O26892 Other specified pregnancy related conditions, second trimester: Secondary | ICD-10-CM | POA: Insufficient documentation

## 2020-03-07 DIAGNOSIS — R102 Pelvic and perineal pain: Secondary | ICD-10-CM | POA: Insufficient documentation

## 2020-03-07 DIAGNOSIS — O3412 Maternal care for benign tumor of corpus uteri, second trimester: Secondary | ICD-10-CM

## 2020-03-07 DIAGNOSIS — Z3A27 27 weeks gestation of pregnancy: Secondary | ICD-10-CM

## 2020-03-07 DIAGNOSIS — O365990 Maternal care for other known or suspected poor fetal growth, unspecified trimester, not applicable or unspecified: Secondary | ICD-10-CM | POA: Diagnosis present

## 2020-03-07 DIAGNOSIS — O289 Unspecified abnormal findings on antenatal screening of mother: Secondary | ICD-10-CM

## 2020-03-07 DIAGNOSIS — O26872 Cervical shortening, second trimester: Secondary | ICD-10-CM

## 2020-03-07 DIAGNOSIS — Z148 Genetic carrier of other disease: Secondary | ICD-10-CM

## 2020-03-08 ENCOUNTER — Telehealth: Payer: Self-pay | Admitting: *Deleted

## 2020-03-08 ENCOUNTER — Other Ambulatory Visit: Payer: Self-pay | Admitting: *Deleted

## 2020-03-08 NOTE — Telephone Encounter (Signed)
Voicemail message received from "Lauren" who stated that she is pt's employer. She has questions regarding pregnancy restrictions letter signed by Dr. Jodi Mourning. Please call back to provide clarification of how long this will be in effect, how long the breaks should last and what is the length of time she can stand.

## 2020-03-11 ENCOUNTER — Ambulatory Visit (HOSPITAL_BASED_OUTPATIENT_CLINIC_OR_DEPARTMENT_OTHER): Payer: Medicaid Other | Admitting: *Deleted

## 2020-03-11 ENCOUNTER — Other Ambulatory Visit: Payer: Self-pay

## 2020-03-11 ENCOUNTER — Ambulatory Visit: Payer: Medicaid Other | Admitting: *Deleted

## 2020-03-11 ENCOUNTER — Ambulatory Visit: Payer: Medicaid Other | Attending: Obstetrics and Gynecology

## 2020-03-11 DIAGNOSIS — Z3A27 27 weeks gestation of pregnancy: Secondary | ICD-10-CM | POA: Diagnosis not present

## 2020-03-11 DIAGNOSIS — O365920 Maternal care for other known or suspected poor fetal growth, second trimester, not applicable or unspecified: Secondary | ICD-10-CM

## 2020-03-11 DIAGNOSIS — Z3480 Encounter for supervision of other normal pregnancy, unspecified trimester: Secondary | ICD-10-CM

## 2020-03-11 DIAGNOSIS — O26892 Other specified pregnancy related conditions, second trimester: Secondary | ICD-10-CM

## 2020-03-11 DIAGNOSIS — O10919 Unspecified pre-existing hypertension complicating pregnancy, unspecified trimester: Secondary | ICD-10-CM

## 2020-03-11 DIAGNOSIS — O10912 Unspecified pre-existing hypertension complicating pregnancy, second trimester: Secondary | ICD-10-CM

## 2020-03-11 DIAGNOSIS — R102 Pelvic and perineal pain: Secondary | ICD-10-CM | POA: Diagnosis present

## 2020-03-11 DIAGNOSIS — O321XX0 Maternal care for breech presentation, not applicable or unspecified: Secondary | ICD-10-CM

## 2020-03-11 DIAGNOSIS — O26872 Cervical shortening, second trimester: Secondary | ICD-10-CM | POA: Diagnosis not present

## 2020-03-11 DIAGNOSIS — O365990 Maternal care for other known or suspected poor fetal growth, unspecified trimester, not applicable or unspecified: Secondary | ICD-10-CM | POA: Diagnosis present

## 2020-03-11 DIAGNOSIS — Z148 Genetic carrier of other disease: Secondary | ICD-10-CM

## 2020-03-11 DIAGNOSIS — O3410 Maternal care for benign tumor of corpus uteri, unspecified trimester: Secondary | ICD-10-CM

## 2020-03-11 DIAGNOSIS — O289 Unspecified abnormal findings on antenatal screening of mother: Secondary | ICD-10-CM

## 2020-03-11 NOTE — Procedures (Signed)
Sally Taylor 1990-11-20 [redacted]w[redacted]d  Fetus A Non-Stress Test Interpretation for 03/11/20  Indication: Chronic Hypertenstion  Fetal Heart Rate A Mode: External Baseline Rate (A): 140 bpm Variability: Moderate Accelerations: 10 x 10 Decelerations: None Multiple birth?: No  Uterine Activity Mode: Palpation, Toco Contraction Frequency (min): None Resting Tone Palpated: Relaxed Resting Time: Adequate  Interpretation (Fetal Testing) Nonstress Test Interpretation: Reactive Overall Impression: Reassuring for gestational age Comments: Dr. Annamaria Boots reviewed tracing.

## 2020-03-12 NOTE — Telephone Encounter (Signed)
Requested information given to pt's employer. EDD 06/05/20 No prolonged standing after 2 hours 10 min breaks after 2 hours

## 2020-03-13 ENCOUNTER — Ambulatory Visit (INDEPENDENT_AMBULATORY_CARE_PROVIDER_SITE_OTHER): Payer: Medicaid Other | Admitting: Obstetrics and Gynecology

## 2020-03-13 ENCOUNTER — Encounter: Payer: Self-pay | Admitting: Obstetrics and Gynecology

## 2020-03-13 ENCOUNTER — Other Ambulatory Visit: Payer: Self-pay

## 2020-03-13 ENCOUNTER — Other Ambulatory Visit: Payer: Medicaid Other

## 2020-03-13 VITALS — BP 123/83 | HR 87 | Wt 216.4 lb

## 2020-03-13 DIAGNOSIS — Z23 Encounter for immunization: Secondary | ICD-10-CM | POA: Diagnosis not present

## 2020-03-13 DIAGNOSIS — Z3480 Encounter for supervision of other normal pregnancy, unspecified trimester: Secondary | ICD-10-CM

## 2020-03-13 DIAGNOSIS — O26873 Cervical shortening, third trimester: Secondary | ICD-10-CM

## 2020-03-13 DIAGNOSIS — O365920 Maternal care for other known or suspected poor fetal growth, second trimester, not applicable or unspecified: Secondary | ICD-10-CM

## 2020-03-13 DIAGNOSIS — I10 Essential (primary) hypertension: Secondary | ICD-10-CM

## 2020-03-13 DIAGNOSIS — Z148 Genetic carrier of other disease: Secondary | ICD-10-CM

## 2020-03-13 NOTE — Patient Instructions (Signed)
Third Trimester of Pregnancy The third trimester is from week 28 through week 40 (months 7 through 9). The third trimester is a time when the unborn baby (fetus) is growing rapidly. At the end of the ninth month, the fetus is about 20 inches in length and weighs 6-10 pounds. Body changes during your third trimester Your body will continue to go through many changes during pregnancy. The changes vary from woman to woman. During the third trimester:  Your weight will continue to increase. You can expect to gain 25-35 pounds (11-16 kg) by the end of the pregnancy.  You may begin to get stretch marks on your hips, abdomen, and breasts.  You may urinate more often because the fetus is moving lower into your pelvis and pressing on your bladder.  You may develop or continue to have heartburn. This is caused by increased hormones that slow down muscles in the digestive tract.  You may develop or continue to have constipation because increased hormones slow digestion and cause the muscles that push waste through your intestines to relax.  You may develop hemorrhoids. These are swollen veins (varicose veins) in the rectum that can itch or be painful.  You may develop swollen, bulging veins (varicose veins) in your legs.  You may have increased body aches in the pelvis, back, or thighs. This is due to weight gain and increased hormones that are relaxing your joints.  You may have changes in your hair. These can include thickening of your hair, rapid growth, and changes in texture. Some women also have hair loss during or after pregnancy, or hair that feels dry or thin. Your hair will most likely return to normal after your baby is born.  Your breasts will continue to grow and they will continue to become tender. A yellow fluid (colostrum) may leak from your breasts. This is the first milk you are producing for your baby.  Your belly button may stick out.  You may notice more swelling in your hands,  face, or ankles.  You may have increased tingling or numbness in your hands, arms, and legs. The skin on your belly may also feel numb.  You may feel short of breath because of your expanding uterus.  You may have more problems sleeping. This can be caused by the size of your belly, increased need to urinate, and an increase in your body's metabolism.  You may notice the fetus "dropping," or moving lower in your abdomen (lightening).  You may have increased vaginal discharge.  You may notice your joints feel loose and you may have pain around your pelvic bone. What to expect at prenatal visits You will have prenatal exams every 2 weeks until week 36. Then you will have weekly prenatal exams. During a routine prenatal visit:  You will be weighed to make sure you and the baby are growing normally.  Your blood pressure will be taken.  Your abdomen will be measured to track your baby's growth.  The fetal heartbeat will be listened to.  Any test results from the previous visit will be discussed.  You may have a cervical check near your due date to see if your cervix has softened or thinned (effaced).  You will be tested for Group B streptococcus. This happens between 35 and 37 weeks. Your health care provider may ask you:  What your birth plan is.  How you are feeling.  If you are feeling the baby move.  If you have had any abnormal   symptoms, such as leaking fluid, bleeding, severe headaches, or abdominal cramping.  If you are using any tobacco products, including cigarettes, chewing tobacco, and electronic cigarettes.  If you have any questions. Other tests or screenings that may be performed during your third trimester include:  Blood tests that check for low iron levels (anemia).  Fetal testing to check the health, activity level, and growth of the fetus. Testing is done if you have certain medical conditions or if there are problems during the pregnancy.  Nonstress test  (NST). This test checks the health of your baby to make sure there are no signs of problems, such as the baby not getting enough oxygen. During this test, a belt is placed around your belly. The baby is made to move, and its heart rate is monitored during movement. What is false labor? False labor is a condition in which you feel small, irregular tightenings of the muscles in the womb (contractions) that usually go away with rest, changing position, or drinking water. These are called Braxton Hicks contractions. Contractions may last for hours, days, or even weeks before true labor sets in. If contractions come at regular intervals, become more frequent, increase in intensity, or become painful, you should see your health care provider. What are the signs of labor?  Abdominal cramps.  Regular contractions that start at 10 minutes apart and become stronger and more frequent with time.  Contractions that start on the top of the uterus and spread down to the lower abdomen and back.  Increased pelvic pressure and dull back pain.  A watery or bloody mucus discharge that comes from the vagina.  Leaking of amniotic fluid. This is also known as your "water breaking." It could be a slow trickle or a gush. Let your health care provider know if it has a color or strange odor. If you have any of these signs, call your health care provider right away, even if it is before your due date. Follow these instructions at home: Medicines  Follow your health care provider's instructions regarding medicine use. Specific medicines may be either safe or unsafe to take during pregnancy.  Take a prenatal vitamin that contains at least 600 micrograms (mcg) of folic acid.  If you develop constipation, try taking a stool softener if your health care provider approves. Eating and drinking   Eat a balanced diet that includes fresh fruits and vegetables, whole grains, good sources of protein such as meat, eggs, or tofu,  and low-fat dairy. Your health care provider will help you determine the amount of weight gain that is right for you.  Avoid raw meat and uncooked cheese. These carry germs that can cause birth defects in the baby.  If you have low calcium intake from food, talk to your health care provider about whether you should take a daily calcium supplement.  Eat four or five small meals rather than three large meals a day.  Limit foods that are high in fat and processed sugars, such as fried and sweet foods.  To prevent constipation: ? Drink enough fluid to keep your urine clear or pale yellow. ? Eat foods that are high in fiber, such as fresh fruits and vegetables, whole grains, and beans. Activity  Exercise only as directed by your health care provider. Most women can continue their usual exercise routine during pregnancy. Try to exercise for 30 minutes at least 5 days a week. Stop exercising if you experience uterine contractions.  Avoid heavy lifting.  Do   not exercise in extreme heat or humidity, or at high altitudes.  Wear low-heel, comfortable shoes.  Practice good posture.  You may continue to have sex unless your health care provider tells you otherwise. Relieving pain and discomfort  Take frequent breaks and rest with your legs elevated if you have leg cramps or low back pain.  Take warm sitz baths to soothe any pain or discomfort caused by hemorrhoids. Use hemorrhoid cream if your health care provider approves.  Wear a good support bra to prevent discomfort from breast tenderness.  If you develop varicose veins: ? Wear support pantyhose or compression stockings as told by your healthcare provider. ? Elevate your feet for 15 minutes, 3-4 times a day. Prenatal care  Write down your questions. Take them to your prenatal visits.  Keep all your prenatal visits as told by your health care provider. This is important. Safety  Wear your seat belt at all times when driving.  Make  a list of emergency phone numbers, including numbers for family, friends, the hospital, and police and fire departments. General instructions  Avoid cat litter boxes and soil used by cats. These carry germs that can cause birth defects in the baby. If you have a cat, ask someone to clean the litter box for you.  Do not travel far distances unless it is absolutely necessary and only with the approval of your health care provider.  Do not use hot tubs, steam rooms, or saunas.  Do not drink alcohol.  Do not use any products that contain nicotine or tobacco, such as cigarettes and e-cigarettes. If you need help quitting, ask your health care provider.  Do not use any medicinal herbs or unprescribed drugs. These chemicals affect the formation and growth of the baby.  Do not douche or use tampons or scented sanitary pads.  Do not cross your legs for long periods of time.  To prepare for the arrival of your baby: ? Take prenatal classes to understand, practice, and ask questions about labor and delivery. ? Make a trial run to the hospital. ? Visit the hospital and tour the maternity area. ? Arrange for maternity or paternity leave through employers. ? Arrange for family and friends to take care of pets while you are in the hospital. ? Purchase a rear-facing car seat and make sure you know how to install it in your car. ? Pack your hospital bag. ? Prepare the baby's nursery. Make sure to remove all pillows and stuffed animals from the baby's crib to prevent suffocation.  Visit your dentist if you have not gone during your pregnancy. Use a soft toothbrush to brush your teeth and be gentle when you floss. Contact a health care provider if:  You are unsure if you are in labor or if your water has broken.  You become dizzy.  You have mild pelvic cramps, pelvic pressure, or nagging pain in your abdominal area.  You have lower back pain.  You have persistent nausea, vomiting, or  diarrhea.  You have an unusual or bad smelling vaginal discharge.  You have pain when you urinate. Get help right away if:  Your water breaks before 37 weeks.  You have regular contractions less than 5 minutes apart before 37 weeks.  You have a fever.  You are leaking fluid from your vagina.  You have spotting or bleeding from your vagina.  You have severe abdominal pain or cramping.  You have rapid weight loss or weight gain.  You have   shortness of breath with chest pain.  You notice sudden or extreme swelling of your face, hands, ankles, feet, or legs.  Your baby makes fewer than 10 movements in 2 hours.  You have severe headaches that do not go away when you take medicine.  You have vision changes. Summary  The third trimester is from week 28 through week 40, months 7 through 9. The third trimester is a time when the unborn baby (fetus) is growing rapidly.  During the third trimester, your discomfort may increase as you and your baby continue to gain weight. You may have abdominal, leg, and back pain, sleeping problems, and an increased need to urinate.  During the third trimester your breasts will keep growing and they will continue to become tender. A yellow fluid (colostrum) may leak from your breasts. This is the first milk you are producing for your baby.  False labor is a condition in which you feel small, irregular tightenings of the muscles in the womb (contractions) that eventually go away. These are called Braxton Hicks contractions. Contractions may last for hours, days, or even weeks before true labor sets in.  Signs of labor can include: abdominal cramps; regular contractions that start at 10 minutes apart and become stronger and more frequent with time; watery or bloody mucus discharge that comes from the vagina; increased pelvic pressure and dull back pain; and leaking of amniotic fluid. This information is not intended to replace advice given to you by your  health care provider. Make sure you discuss any questions you have with your health care provider. Document Revised: 10/27/2018 Document Reviewed: 08/11/2016 Elsevier Patient Education  2020 [employer]  

## 2020-03-13 NOTE — Progress Notes (Signed)
Subjective:  Sally Taylor is a 29 y.o. G2P0010 at [redacted]w[redacted]d being seen today for ongoing prenatal care.  She is currently monitored for the following issues for this high-risk pregnancy and has History of ectopic pregnancy; Supervision of other normal pregnancy, antepartum; Genetic carrier status; Transient hypertension of pregnancy, antepartum; IUGR (intrauterine growth restriction) affecting care of mother; Pelvic pain affecting pregnancy in second trimester, antepartum; Short cervix during pregnancy in third trimester; and Chronic hypertension on their problem list.  Patient reports no complaints.  Contractions: Not present. Vag. Bleeding: None.  Movement: Present. Denies leaking of fluid.   The following portions of the patient's history were reviewed and updated as appropriate: allergies, current medications, past family history, past medical history, past social history, past surgical history and problem list. Problem list updated.  Objective:   Vitals:   03/13/20 0914 03/13/20 0917  BP: (!) 135/94 123/83  Pulse: 86 87  Weight: 216 lb 6.4 oz (98.2 kg)     Fetal Status: Fetal Heart Rate (bpm): 155   Movement: Present     General:  Alert, oriented and cooperative. Patient is in no acute distress.  Skin: Skin is warm and dry. No rash noted.   Cardiovascular: Normal heart rate noted  Respiratory: Normal respiratory effort, no problems with respiration noted  Abdomen: Soft, gravid, appropriate for gestational age. Pain/Pressure: Absent     Pelvic:  Cervical exam deferred        Extremities: Normal range of motion.  Edema: None  Mental Status: Normal mood and affect. Normal behavior. Normal judgment and thought content.   Urinalysis:      Assessment and Plan:  Pregnancy: G2P0010 at [redacted]w[redacted]d  1. Supervision of other normal pregnancy, antepartum Stable - Glucose Tolerance, 2 Hours w/1 Hour - CBC - RPR - HIV Antibody (routine testing w rflx) - Tdap vaccine greater than or  equal to 7yo IM  2. Chronic hypertension BP stable without meds Continue with qd BASA Serial growth scans  3. Short cervix during pregnancy in third trimester Stable No S/Sx of PTL Continue with vaginal progesterone  4. Poor fetal growth affecting management of mother in second trimester, single or unspecified fetus Weekly doppler studies and serial growth scans  5. Genetic carrier status Stable   Preterm labor symptoms and general obstetric precautions including but not limited to vaginal bleeding, contractions, leaking of fluid and fetal movement were reviewed in detail with the patient. Please refer to After Visit Summary for other counseling recommendations.  Return in about 2 weeks (around 03/27/2020) for OB visit, face to face, MD only.   Chancy Milroy, MD

## 2020-03-13 NOTE — Progress Notes (Signed)
Pt is here for ROB and 2 hour GTT, [redacted]w[redacted]d.

## 2020-03-14 ENCOUNTER — Other Ambulatory Visit: Payer: Medicaid Other

## 2020-03-15 LAB — GLUCOSE TOLERANCE, 2 HOURS W/ 1HR
Glucose, 1 hour: 155 mg/dL (ref 65–179)
Glucose, 2 hour: 90 mg/dL (ref 65–152)
Glucose, Fasting: 81 mg/dL (ref 65–91)

## 2020-03-15 LAB — HIV ANTIBODY (ROUTINE TESTING W REFLEX): HIV Screen 4th Generation wRfx: NONREACTIVE

## 2020-03-15 LAB — CBC
Hematocrit: 35.9 % (ref 34.0–46.6)
Hemoglobin: 11.6 g/dL (ref 11.1–15.9)
MCH: 27.2 pg (ref 26.6–33.0)
MCHC: 32.3 g/dL (ref 31.5–35.7)
MCV: 84 fL (ref 79–97)
Platelets: 386 10*3/uL (ref 150–450)
RBC: 4.26 x10E6/uL (ref 3.77–5.28)
RDW: 13.6 % (ref 11.7–15.4)
WBC: 9.8 10*3/uL (ref 3.4–10.8)

## 2020-03-15 LAB — RPR, QUANT+TP ABS (REFLEX)
Rapid Plasma Reagin, Quant: 1:1 {titer} — ABNORMAL HIGH
T Pallidum Abs: NONREACTIVE

## 2020-03-15 LAB — RPR: RPR Ser Ql: REACTIVE — AB

## 2020-03-18 ENCOUNTER — Ambulatory Visit: Payer: Managed Care, Other (non HMO) | Attending: Obstetrics and Gynecology

## 2020-03-18 ENCOUNTER — Other Ambulatory Visit: Payer: Self-pay

## 2020-03-18 ENCOUNTER — Ambulatory Visit: Payer: Managed Care, Other (non HMO) | Admitting: *Deleted

## 2020-03-18 ENCOUNTER — Encounter: Payer: Self-pay | Admitting: *Deleted

## 2020-03-18 ENCOUNTER — Ambulatory Visit (HOSPITAL_BASED_OUTPATIENT_CLINIC_OR_DEPARTMENT_OTHER): Payer: Managed Care, Other (non HMO) | Admitting: *Deleted

## 2020-03-18 DIAGNOSIS — O26892 Other specified pregnancy related conditions, second trimester: Secondary | ICD-10-CM | POA: Diagnosis present

## 2020-03-18 DIAGNOSIS — R102 Pelvic and perineal pain: Secondary | ICD-10-CM

## 2020-03-18 DIAGNOSIS — O3413 Maternal care for benign tumor of corpus uteri, third trimester: Secondary | ICD-10-CM | POA: Diagnosis not present

## 2020-03-18 DIAGNOSIS — O289 Unspecified abnormal findings on antenatal screening of mother: Secondary | ICD-10-CM

## 2020-03-18 DIAGNOSIS — Z3A28 28 weeks gestation of pregnancy: Secondary | ICD-10-CM | POA: Diagnosis not present

## 2020-03-18 DIAGNOSIS — O365930 Maternal care for other known or suspected poor fetal growth, third trimester, not applicable or unspecified: Secondary | ICD-10-CM | POA: Diagnosis not present

## 2020-03-18 DIAGNOSIS — O26872 Cervical shortening, second trimester: Secondary | ICD-10-CM | POA: Diagnosis not present

## 2020-03-18 DIAGNOSIS — O365990 Maternal care for other known or suspected poor fetal growth, unspecified trimester, not applicable or unspecified: Secondary | ICD-10-CM | POA: Insufficient documentation

## 2020-03-18 DIAGNOSIS — Z3480 Encounter for supervision of other normal pregnancy, unspecified trimester: Secondary | ICD-10-CM | POA: Insufficient documentation

## 2020-03-18 DIAGNOSIS — Z362 Encounter for other antenatal screening follow-up: Secondary | ICD-10-CM

## 2020-03-18 DIAGNOSIS — I10 Essential (primary) hypertension: Secondary | ICD-10-CM

## 2020-03-18 DIAGNOSIS — Z148 Genetic carrier of other disease: Secondary | ICD-10-CM

## 2020-03-18 NOTE — Procedures (Signed)
Kiah Cherie Laberth-Brown 09/04/1990 [redacted]w[redacted]d  Fetus A Non-Stress Test Interpretation for 03/18/20  Indication: IUGR  Fetal Heart Rate A Mode: External Baseline Rate (A): 140 bpm Variability: Moderate Accelerations: 10 x 10 Decelerations: None Multiple birth?: No  Uterine Activity Mode: Palpation, Toco Contraction Frequency (min): Occas UI Contraction Quality: Mild Resting Tone Palpated: Relaxed Resting Time: Adequate  Interpretation (Fetal Testing) Nonstress Test Interpretation: Reactive Comments: Dr. Annamaria Boots reviewed tracing

## 2020-03-19 ENCOUNTER — Other Ambulatory Visit: Payer: Self-pay | Admitting: *Deleted

## 2020-03-19 DIAGNOSIS — O365930 Maternal care for other known or suspected poor fetal growth, third trimester, not applicable or unspecified: Secondary | ICD-10-CM

## 2020-03-20 ENCOUNTER — Ambulatory Visit: Payer: Medicaid Other | Attending: Physical Therapy | Admitting: Physical Therapy

## 2020-03-24 ENCOUNTER — Encounter: Payer: Self-pay | Admitting: Obstetrics and Gynecology

## 2020-03-24 DIAGNOSIS — A530 Latent syphilis, unspecified as early or late: Secondary | ICD-10-CM | POA: Insufficient documentation

## 2020-03-27 ENCOUNTER — Ambulatory Visit: Payer: Medicaid Other | Attending: Obstetrics and Gynecology

## 2020-03-27 ENCOUNTER — Ambulatory Visit: Payer: Medicaid Other

## 2020-03-27 ENCOUNTER — Other Ambulatory Visit: Payer: Medicaid Other

## 2020-03-27 ENCOUNTER — Ambulatory Visit (INDEPENDENT_AMBULATORY_CARE_PROVIDER_SITE_OTHER): Payer: Medicaid Other | Admitting: Obstetrics and Gynecology

## 2020-03-27 ENCOUNTER — Other Ambulatory Visit: Payer: Self-pay

## 2020-03-27 ENCOUNTER — Encounter: Payer: Self-pay | Admitting: Obstetrics and Gynecology

## 2020-03-27 VITALS — BP 129/84 | HR 92 | Wt 224.0 lb

## 2020-03-27 DIAGNOSIS — I10 Essential (primary) hypertension: Secondary | ICD-10-CM

## 2020-03-27 DIAGNOSIS — Z3480 Encounter for supervision of other normal pregnancy, unspecified trimester: Secondary | ICD-10-CM

## 2020-03-27 DIAGNOSIS — O26873 Cervical shortening, third trimester: Secondary | ICD-10-CM

## 2020-03-27 DIAGNOSIS — O365930 Maternal care for other known or suspected poor fetal growth, third trimester, not applicable or unspecified: Secondary | ICD-10-CM

## 2020-03-27 NOTE — Progress Notes (Signed)
   PRENATAL VISIT NOTE  Subjective:  Sally Taylor is a 29 y.o. G2P0010 at [redacted]w[redacted]d being seen today for ongoing prenatal care.  She is currently monitored for the following issues for this high-risk pregnancy and has History of ectopic pregnancy; Supervision of other normal pregnancy, antepartum; Genetic carrier status; IUGR (intrauterine growth restriction) affecting care of mother; Pelvic pain affecting pregnancy in second trimester, antepartum; Short cervix during pregnancy in third trimester; Chronic hypertension; and Positive RPR test on their problem list.  Patient reports no complaints.  Contractions: Not present. Vag. Bleeding: None.  Movement: Present. Denies leaking of fluid.   The following portions of the patient's history were reviewed and updated as appropriate: allergies, current medications, past family history, past medical history, past social history, past surgical history and problem list.   Objective:   Vitals:   03/27/20 1633 03/27/20 1637  BP: (!) 150/88 129/84  Pulse: 92 92  Weight: 224 lb (101.6 kg)     Fetal Status: Fetal Heart Rate (bpm): 140 Fundal Height: 30 cm Movement: Present     General:  Alert, oriented and cooperative. Patient is in no acute distress.  Skin: Skin is warm and dry. No rash noted.   Cardiovascular: Normal heart rate noted  Respiratory: Normal respiratory effort, no problems with respiration noted  Abdomen: Soft, gravid, appropriate for gestational age.  Pain/Pressure: Absent     Pelvic: Cervical exam deferred        Extremities: Normal range of motion.     Mental Status: Normal mood and affect. Normal behavior. Normal judgment and thought content.   Assessment and Plan:  Pregnancy: G2P0010 at [redacted]w[redacted]d 1. Supervision of other normal pregnancy, antepartum Patient is doing well without complaints Normal glucola last visit  2. Chronic hypertension Continue ASA and monitoring BP  3. Poor fetal growth affecting management of  mother in third trimester, single or unspecified fetus Follow up ultrasound with dopplers 9/13  4. Short cervix during pregnancy in third trimester Continue prometrium  Preterm labor symptoms and general obstetric precautions including but not limited to vaginal bleeding, contractions, leaking of fluid and fetal movement were reviewed in detail with the patient. Please refer to After Visit Summary for other counseling recommendations.   Return in about 2 weeks (around 04/10/2020) for in person, ROB, High risk.  Future Appointments  Date Time Provider [place]  04/01/2020  3:30 PM [facility] NURSE [facility]  04/01/2020  3:45 PM [facility] US5 [facility]  04/01/2020  4:00 PM [facility] NST [facility]  04/08/2020  2:15 PM [facility] NURSE [facility]  04/08/2020  2:30 PM [facility] US3 [facility] [place]    Mora Bellman, MD

## 2020-03-27 NOTE — Progress Notes (Signed)
Pt is doing well.  Has u/s scheduled on Monday.

## 2020-04-01 ENCOUNTER — Ambulatory Visit (HOSPITAL_BASED_OUTPATIENT_CLINIC_OR_DEPARTMENT_OTHER): Payer: Medicaid Other | Admitting: *Deleted

## 2020-04-01 ENCOUNTER — Encounter: Payer: Self-pay | Admitting: *Deleted

## 2020-04-01 ENCOUNTER — Ambulatory Visit: Payer: Medicaid Other | Admitting: *Deleted

## 2020-04-01 ENCOUNTER — Other Ambulatory Visit: Payer: Self-pay

## 2020-04-01 ENCOUNTER — Ambulatory Visit: Payer: Medicaid Other | Attending: Obstetrics and Gynecology

## 2020-04-01 ENCOUNTER — Ambulatory Visit: Payer: Medicaid Other | Admitting: Physical Therapy

## 2020-04-01 DIAGNOSIS — O10919 Unspecified pre-existing hypertension complicating pregnancy, unspecified trimester: Secondary | ICD-10-CM | POA: Diagnosis not present

## 2020-04-01 DIAGNOSIS — Z3480 Encounter for supervision of other normal pregnancy, unspecified trimester: Secondary | ICD-10-CM | POA: Diagnosis present

## 2020-04-01 DIAGNOSIS — O365930 Maternal care for other known or suspected poor fetal growth, third trimester, not applicable or unspecified: Secondary | ICD-10-CM

## 2020-04-01 DIAGNOSIS — R102 Pelvic and perineal pain: Secondary | ICD-10-CM | POA: Insufficient documentation

## 2020-04-01 DIAGNOSIS — O10013 Pre-existing essential hypertension complicating pregnancy, third trimester: Secondary | ICD-10-CM

## 2020-04-01 DIAGNOSIS — Z3A30 30 weeks gestation of pregnancy: Secondary | ICD-10-CM

## 2020-04-01 DIAGNOSIS — Z148 Genetic carrier of other disease: Secondary | ICD-10-CM

## 2020-04-01 DIAGNOSIS — O26892 Other specified pregnancy related conditions, second trimester: Secondary | ICD-10-CM | POA: Insufficient documentation

## 2020-04-01 DIAGNOSIS — O3413 Maternal care for benign tumor of corpus uteri, third trimester: Secondary | ICD-10-CM | POA: Diagnosis not present

## 2020-04-01 DIAGNOSIS — O26873 Cervical shortening, third trimester: Secondary | ICD-10-CM | POA: Diagnosis not present

## 2020-04-01 NOTE — Procedures (Signed)
Sally Taylor 01-17-1991 [redacted]w[redacted]d  Fetus A Non-Stress Test Interpretation for 04/01/20  Indication: IUGR and Chronic Hypertenstion  Fetal Heart Rate A Mode: External Baseline Rate (A): 135 bpm Variability: Moderate Accelerations: 15 x 15 Decelerations: None Multiple birth?: No  Uterine Activity Mode: Palpation, Toco Contraction Frequency (min): None Resting Tone Palpated: Relaxed Resting Time: Adequate  Interpretation (Fetal Testing) Nonstress Test Interpretation: Reactive Comments: Dr. Annamaria Boots reviewed tracing

## 2020-04-08 ENCOUNTER — Encounter: Payer: Self-pay | Admitting: *Deleted

## 2020-04-08 ENCOUNTER — Encounter: Payer: Self-pay | Admitting: Obstetrics & Gynecology

## 2020-04-08 ENCOUNTER — Ambulatory Visit: Payer: Medicaid Other | Admitting: *Deleted

## 2020-04-08 ENCOUNTER — Ambulatory Visit: Payer: Medicaid Other | Attending: Obstetrics and Gynecology

## 2020-04-08 ENCOUNTER — Encounter: Payer: Medicaid Other | Admitting: Physical Therapy

## 2020-04-08 ENCOUNTER — Other Ambulatory Visit: Payer: Self-pay

## 2020-04-08 ENCOUNTER — Ambulatory Visit (INDEPENDENT_AMBULATORY_CARE_PROVIDER_SITE_OTHER): Payer: Medicaid Other | Admitting: Obstetrics & Gynecology

## 2020-04-08 VITALS — BP 132/85 | HR 87 | Wt 226.0 lb

## 2020-04-08 DIAGNOSIS — O26873 Cervical shortening, third trimester: Secondary | ICD-10-CM | POA: Diagnosis not present

## 2020-04-08 DIAGNOSIS — O3413 Maternal care for benign tumor of corpus uteri, third trimester: Secondary | ICD-10-CM

## 2020-04-08 DIAGNOSIS — O26892 Other specified pregnancy related conditions, second trimester: Secondary | ICD-10-CM | POA: Diagnosis present

## 2020-04-08 DIAGNOSIS — Z3480 Encounter for supervision of other normal pregnancy, unspecified trimester: Secondary | ICD-10-CM | POA: Insufficient documentation

## 2020-04-08 DIAGNOSIS — O365930 Maternal care for other known or suspected poor fetal growth, third trimester, not applicable or unspecified: Secondary | ICD-10-CM | POA: Diagnosis not present

## 2020-04-08 DIAGNOSIS — O321XX0 Maternal care for breech presentation, not applicable or unspecified: Secondary | ICD-10-CM

## 2020-04-08 DIAGNOSIS — D259 Leiomyoma of uterus, unspecified: Secondary | ICD-10-CM

## 2020-04-08 DIAGNOSIS — O289 Unspecified abnormal findings on antenatal screening of mother: Secondary | ICD-10-CM | POA: Diagnosis not present

## 2020-04-08 DIAGNOSIS — R102 Pelvic and perineal pain: Secondary | ICD-10-CM | POA: Diagnosis present

## 2020-04-08 DIAGNOSIS — O10913 Unspecified pre-existing hypertension complicating pregnancy, third trimester: Secondary | ICD-10-CM

## 2020-04-08 DIAGNOSIS — Z3A31 31 weeks gestation of pregnancy: Secondary | ICD-10-CM

## 2020-04-08 DIAGNOSIS — Z148 Genetic carrier of other disease: Secondary | ICD-10-CM

## 2020-04-08 DIAGNOSIS — I10 Essential (primary) hypertension: Secondary | ICD-10-CM

## 2020-04-08 NOTE — Progress Notes (Signed)
ROB [redacted]w[redacted]d  Pt here for ROB after U/S .   First B/P reading elevated  Pt notes HA last 2 days , no visual changes no dizziness.  Changed cuff sized and repeated B/P a few mins later B/P 132/85 P: 87  CC: None

## 2020-04-08 NOTE — Progress Notes (Signed)
   PRENATAL VISIT NOTE  Subjective:  Sally Taylor is a 29 y.o. G2P0010 at [redacted]w[redacted]d being seen today for ongoing prenatal care.  She is currently monitored for the following issues for this high-risk pregnancy and has History of ectopic pregnancy; Supervision of other normal pregnancy, antepartum; Genetic carrier status; IUGR (intrauterine growth restriction) affecting care of mother; Pelvic pain affecting pregnancy in second trimester, antepartum; Short cervix during pregnancy in third trimester; Chronic hypertension; and Positive RPR test on their problem list.  Patient reports no complaints.  Contractions: Not present. Vag. Bleeding: None.  Movement: Present. Denies leaking of fluid.   The following portions of the patient's history were reviewed and updated as appropriate: allergies, current medications, past family history, past medical history, past social history, past surgical history and problem list.   Objective:   Vitals:   04/08/20 1633 04/08/20 1642  BP: (!) 166/105 132/85  Pulse: 89 87  Weight: 226 lb (102.5 kg)     Fetal Status: Fetal Heart Rate (bpm): 144 Fundal Height: 32 cm Movement: Present     General:  Alert, oriented and cooperative. Patient is in no acute distress.  Skin: Skin is warm and dry. No rash noted.   Cardiovascular: Normal heart rate noted  Respiratory: Normal respiratory effort, no problems with respiration noted  Abdomen: Soft, gravid, appropriate for gestational age.  Pain/Pressure: Absent     Pelvic: Cervical exam deferred        Extremities: Normal range of motion.  Edema: Trace  Mental Status: Normal mood and affect. Normal behavior. Normal judgment and thought content.   Assessment and Plan:  Pregnancy: G2P0010 at [redacted]w[redacted]d 1. Supervision of other normal pregnancy, antepartum Elevated BP noted and I offered to start anti HTN medication but she was reluctant as this had not been discussed prior to this. She agreed to see what Dr.  Donalee Citrin recommended and to get back to her Chronic hypertension  Supervision of other normal pregnancy, antepartum  Poor fetal growth affecting management of mother in third trimester, single or unspecified fetus  Short cervix during pregnancy in third trimester   Preterm labor symptoms and general obstetric precautions including but not limited to vaginal bleeding, contractions, leaking of fluid and fetal movement were reviewed in detail with the patient. Please refer to After Visit Summary for other counseling recommendations.   Return in about 2 weeks (around 04/22/2020).  Future Appointments  Date Time Provider [place]  04/15/2020  1:15 PM [facility] US2 [facility]  04/15/2020  2:15 PM [facility] NST [facility]  04/22/2020 10:15 AM Constant, Peggy, MD [place] None  04/22/2020 12:45 PM [facility] US5 [facility]  04/22/2020  2:15 PM [facility] NST [facility]  04/29/2020 12:45 PM [facility] US4 [facility]  04/29/2020  2:15 PM [facility] NST [facility] [place]    Emeterio Reeve, MD

## 2020-04-08 NOTE — Patient Instructions (Signed)

## 2020-04-09 ENCOUNTER — Other Ambulatory Visit: Payer: Self-pay | Admitting: *Deleted

## 2020-04-09 DIAGNOSIS — O365930 Maternal care for other known or suspected poor fetal growth, third trimester, not applicable or unspecified: Secondary | ICD-10-CM

## 2020-04-15 ENCOUNTER — Encounter: Payer: Self-pay | Admitting: *Deleted

## 2020-04-15 ENCOUNTER — Ambulatory Visit: Payer: Medicaid Other | Admitting: *Deleted

## 2020-04-15 ENCOUNTER — Encounter: Payer: Medicaid Other | Admitting: Physical Therapy

## 2020-04-15 ENCOUNTER — Ambulatory Visit: Payer: Medicaid Other

## 2020-04-15 ENCOUNTER — Other Ambulatory Visit: Payer: Self-pay

## 2020-04-15 ENCOUNTER — Ambulatory Visit: Payer: Medicaid Other | Attending: Obstetrics and Gynecology

## 2020-04-15 DIAGNOSIS — Z3480 Encounter for supervision of other normal pregnancy, unspecified trimester: Secondary | ICD-10-CM

## 2020-04-15 DIAGNOSIS — O26892 Other specified pregnancy related conditions, second trimester: Secondary | ICD-10-CM | POA: Diagnosis present

## 2020-04-15 DIAGNOSIS — O321XX0 Maternal care for breech presentation, not applicable or unspecified: Secondary | ICD-10-CM

## 2020-04-15 DIAGNOSIS — D259 Leiomyoma of uterus, unspecified: Secondary | ICD-10-CM

## 2020-04-15 DIAGNOSIS — O10913 Unspecified pre-existing hypertension complicating pregnancy, third trimester: Secondary | ICD-10-CM

## 2020-04-15 DIAGNOSIS — O365930 Maternal care for other known or suspected poor fetal growth, third trimester, not applicable or unspecified: Secondary | ICD-10-CM | POA: Diagnosis present

## 2020-04-15 DIAGNOSIS — Z3A32 32 weeks gestation of pregnancy: Secondary | ICD-10-CM

## 2020-04-15 DIAGNOSIS — O10013 Pre-existing essential hypertension complicating pregnancy, third trimester: Secondary | ICD-10-CM

## 2020-04-15 DIAGNOSIS — O289 Unspecified abnormal findings on antenatal screening of mother: Secondary | ICD-10-CM

## 2020-04-15 DIAGNOSIS — R102 Pelvic and perineal pain: Secondary | ICD-10-CM | POA: Insufficient documentation

## 2020-04-15 DIAGNOSIS — Z148 Genetic carrier of other disease: Secondary | ICD-10-CM

## 2020-04-15 DIAGNOSIS — O280 Abnormal hematological finding on antenatal screening of mother: Secondary | ICD-10-CM | POA: Diagnosis not present

## 2020-04-15 DIAGNOSIS — O3413 Maternal care for benign tumor of corpus uteri, third trimester: Secondary | ICD-10-CM | POA: Diagnosis not present

## 2020-04-15 DIAGNOSIS — O26873 Cervical shortening, third trimester: Secondary | ICD-10-CM

## 2020-04-16 ENCOUNTER — Other Ambulatory Visit: Payer: Self-pay | Admitting: Obstetrics and Gynecology

## 2020-04-16 DIAGNOSIS — O365930 Maternal care for other known or suspected poor fetal growth, third trimester, not applicable or unspecified: Secondary | ICD-10-CM

## 2020-04-22 ENCOUNTER — Telehealth (INDEPENDENT_AMBULATORY_CARE_PROVIDER_SITE_OTHER): Payer: Managed Care, Other (non HMO) | Admitting: Obstetrics and Gynecology

## 2020-04-22 ENCOUNTER — Encounter: Payer: Self-pay | Admitting: *Deleted

## 2020-04-22 ENCOUNTER — Ambulatory Visit: Payer: Medicaid Other | Attending: Obstetrics and Gynecology

## 2020-04-22 ENCOUNTER — Other Ambulatory Visit: Payer: Self-pay

## 2020-04-22 ENCOUNTER — Other Ambulatory Visit: Payer: Self-pay | Admitting: *Deleted

## 2020-04-22 ENCOUNTER — Ambulatory Visit: Payer: Medicaid Other | Admitting: *Deleted

## 2020-04-22 ENCOUNTER — Encounter: Payer: Self-pay | Admitting: Obstetrics and Gynecology

## 2020-04-22 VITALS — BP 160/100 | HR 84

## 2020-04-22 DIAGNOSIS — O365930 Maternal care for other known or suspected poor fetal growth, third trimester, not applicable or unspecified: Secondary | ICD-10-CM

## 2020-04-22 DIAGNOSIS — Z3480 Encounter for supervision of other normal pregnancy, unspecified trimester: Secondary | ICD-10-CM

## 2020-04-22 DIAGNOSIS — O3413 Maternal care for benign tumor of corpus uteri, third trimester: Secondary | ICD-10-CM

## 2020-04-22 DIAGNOSIS — O289 Unspecified abnormal findings on antenatal screening of mother: Secondary | ICD-10-CM | POA: Diagnosis not present

## 2020-04-22 DIAGNOSIS — I10 Essential (primary) hypertension: Secondary | ICD-10-CM | POA: Diagnosis not present

## 2020-04-22 DIAGNOSIS — O10913 Unspecified pre-existing hypertension complicating pregnancy, third trimester: Secondary | ICD-10-CM

## 2020-04-22 DIAGNOSIS — O10013 Pre-existing essential hypertension complicating pregnancy, third trimester: Secondary | ICD-10-CM

## 2020-04-22 DIAGNOSIS — O365990 Maternal care for other known or suspected poor fetal growth, unspecified trimester, not applicable or unspecified: Secondary | ICD-10-CM

## 2020-04-22 DIAGNOSIS — Z3A33 33 weeks gestation of pregnancy: Secondary | ICD-10-CM

## 2020-04-22 DIAGNOSIS — O10919 Unspecified pre-existing hypertension complicating pregnancy, unspecified trimester: Secondary | ICD-10-CM | POA: Diagnosis present

## 2020-04-22 DIAGNOSIS — R102 Pelvic and perineal pain: Secondary | ICD-10-CM | POA: Diagnosis present

## 2020-04-22 DIAGNOSIS — Z148 Genetic carrier of other disease: Secondary | ICD-10-CM

## 2020-04-22 DIAGNOSIS — O26873 Cervical shortening, third trimester: Secondary | ICD-10-CM | POA: Diagnosis not present

## 2020-04-22 DIAGNOSIS — O26892 Other specified pregnancy related conditions, second trimester: Secondary | ICD-10-CM | POA: Diagnosis present

## 2020-04-22 DIAGNOSIS — O321XX0 Maternal care for breech presentation, not applicable or unspecified: Secondary | ICD-10-CM

## 2020-04-22 DIAGNOSIS — D259 Leiomyoma of uterus, unspecified: Secondary | ICD-10-CM

## 2020-04-22 NOTE — Progress Notes (Signed)
I connected with Sally Taylor on 04/22/20 at 10:15 AM EDT by telephone and verified that I am speaking with the correct person using two identifiers.  Virtual ROB reports BP cuff is malfunctioning. Readings are elevated at home but in the office, they are low. Pt will bring cuff to office to troubleshoot.  MFM visit today at 12:30 pm

## 2020-04-22 NOTE — Progress Notes (Signed)
OBSTETRICS PRENATAL VIRTUAL VISIT ENCOUNTER NOTE  Provider location: [facility] [place]   I connected with Sally Taylor on 04/22/20 at 10:15 AM EDT by MyChart Video Encounter at home and verified that I am speaking with the correct person using two identifiers.   I discussed the limitations, risks, security and privacy concerns of performing an evaluation and management service virtually and the availability of in person appointments. I also discussed with the patient that there may be a patient responsible charge related to this service. The patient expressed understanding and agreed to proceed. Subjective:  Sally Taylor is a 29 y.o. G2P0010 at [redacted]w[redacted]d being seen today for ongoing prenatal care.  She is currently monitored for the following issues for this high-risk pregnancy and has History of ectopic pregnancy; Supervision of other normal pregnancy, antepartum; Genetic carrier status; IUGR (intrauterine growth restriction) affecting care of mother; Pelvic pain affecting pregnancy in second trimester, antepartum; Short cervix during pregnancy in third trimester; Chronic hypertension; and Positive RPR test on their problem list.  Patient reports no complaints.  Contractions: Not present. Vag. Bleeding: None.  Movement: Present. Denies any leaking of fluid.   The following portions of the patient's history were reviewed and updated as appropriate: allergies, current medications, past family history, past medical history, past social history, past surgical history and problem list.   Objective:   Vitals:   04/22/20 0957 04/22/20 1009  BP: (!) 155/102 (!) 160/100  Pulse: 84     Fetal Status:     Movement: Present     General:  Alert, oriented and cooperative. Patient is in no acute distress.  Respiratory: Normal respiratory effort, no problems with respiration noted  Mental Status: Normal mood and affect. Normal behavior. Normal  judgment and thought content.  Rest of physical exam deferred due to type of encounter  Imaging: [place] MFM FETAL BPP WO NON STRESS  Result Date: 04/17/2020 ----------------------------------------------------------------------  OBSTETRICS REPORT                    (Corrected Final 04/17/2020 08:58 am) ---------------------------------------------------------------------- Patient Info  ID #:       [id number]                          D.O.B.:  08-12-1990 (29 yrs)  Name:       Sally Taylor                  Visit Date: 04/15/2020 01:17 pm              [facility] ---------------------------------------------------------------------- Performed By  Attending:        Johnell Comings MD         Ref. Address:     [address]  [address]                                                             [place]  Performed By:     Claudia Desanctis       [facility]                                     [facility] at                                                             [place] [facility]                                                             [facility]  Referred By:      [facility] ---------------------------------------------------------------------- Orders  #  Description                           Code        Ordered By  1  [place] MFM UA CORD DOPPLER                76820.02    RAVI SHANKAR  2  [place] MFM FETAL BPP WO NON               76819.01    RAVI [facility]     STRESS ----------------------------------------------------------------------  #  Order #                     Accession #                Episode #  1  [id number]                   [id number]                 [id number]  2  [id number]                   [id number]                 [id number]  ---------------------------------------------------------------------- Indications  Maternal care for known or suspected poor      O36.5930  fetal growth, third trimester, not applicable or  unspecified IUGR  Abnormal finding on antenatal screening        O28.9  (elevated AFP 2.84)  Genetic carrier (SMA)                          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 Cord Insertion:      Visualized, central  Amniotic Fluid  AFI FV:      Within normal limits  AFI Sum(cm)     %Tile       Largest Pocket(cm)  17.37           64          6.18  RUQ(cm)       RLQ(cm)       LUQ(cm)        LLQ(cm)  3.54          4.33          6.18           3.32 ---------------------------------------------------------------------- Biophysical Evaluation  Amniotic F.V:   Within normal limits       F. Tone:        Observed  F. Movement:    Observed                   Score:          8/8  F. Breathing:   Observed ---------------------------------------------------------------------- OB History  Gravidity:    2         Term:   0        Prem:   0        SAB:   1  TOP:          0       Ectopic:  0        Living: 0 ---------------------------------------------------------------------- Gestational Age  Best:          32w 5d     Det. ByLoman Chroman         EDD:   06/05/20                                      (11/14/19) ---------------------------------------------------------------------- Anatomy  Cranium:               Appears normal         LVOT:                   Previously seen  Cavum:                  Appears normal         Aortic Arch:  Previously seen  Ventricles:            Appears normal         Ductal Arch:            Previously seen  Choroid Plexus:        Appears normal         Stomach:                Appears normal, left                                                                        sided  Cerebellum:            Appears normal         Cord Vessels:           Appears normal (3                                                                        vessel cord)  Heart:                 Appears normal         Kidneys:                Appear normal                         (4CH, axis, and                         situs)  RVOT:                  Previously seen        Bladder:                Appears normal  Other:  Other anatomy previously imaged and appeared normal. ---------------------------------------------------------------------- Doppler - Fetal Vessels  Umbilical Artery   S/D     %tile      RI    %tile                             ADFV    RDFV   3.89       96    0.74       95                                No      No ---------------------------------------------------------------------- Cervix Uterus Adnexa  Cervix  Not visualized (advanced GA >24wks) ---------------------------------------------------------------------- Comments  This patient was seen due to an IUGR fetus.  She denies any  problems since her last exam.  She reports feeling vigorous  fetal movements throughout the day.  A biophysical profile performed today was 8 out of 8.  There was normal amniotic fluid noted on today's ultrasound  exam.  Doppler studies of the umbilical arteries performed due to  fetal growth restriction showed an elevated S/D ratio of 3.89.  There were no signs of absent or reversed end-diastolic flow  noted today.  Another umbilical artery Doppler study and biophysical profile  was scheduled in 1 week. ----------------------------------------------------------------------                         Johnell Comings, MD Electronically Signed Corrected Final Report  04/17/2020 08:58 am ----------------------------------------------------------------------  [place] MFM FETAL BPP WO NON STRESS  Result Date: 04/08/2020 ----------------------------------------------------------------------  OBSTETRICS REPORT                       (Signed Final 04/08/2020 04:07 pm) ---------------------------------------------------------------------- Patient Info  ID #:       [id number]                          D.O.B.:  1990/09/30 (29 yrs)  Name:       Sally Taylor                  Visit Date: 04/08/2020 03:10 pm              [facility] ---------------------------------------------------------------------- Performed By  Attending:        Tama High MD        Ref. Address:     [place]                                                             [place]  Performed By:     Rodrigo Ran BS      [facility] RV[facility]                                 [facility]  Referred By:      [facility] ---------------------------------------------------------------------- Orders  #  Description                           Code        Ordered By  1  [place] MFM UA CORD DOPPLER                76820.02    YU FANG  2  [place] MFM OB FOLLOW UP                   [id number]    YU FANG  3  [place] MFM FETAL BPP WO NON               76819.01    YU FANG     STRESS ----------------------------------------------------------------------  #  Order #                     Accession #                Episode #  1  [id number]                   [id number]                 [id number]  2  [id number]                    [id number]                 [id number]  3  [id number]                   [id number]                 [id number] ---------------------------------------------------------------------- Indications  Maternal care for known or suspected poor      O36.5930  fetal growth, third trimester, not applicable or  unspecified IUGR  Abnormal finding on antenatal screening        O28.9  (elevated AFP 2.84)  Genetic carrier (SMA)                          Z14.8  Uterine fibroids affecting pregnancy in third  O34.13, D25.9  trimester, antepartum  Cervical shortening, third trimester           O26.873  (progesterone)  Hypertension - Chronic/Pre-existing (ASA)      O10.019  [redacted] weeks gestation of pregnancy                Z3A.31 ---------------------------------------------------------------------- Fetal Evaluation  Num Of Fetuses:         1  Fetal Heart Rate(bpm):  136  Cardiac Activity:       Observed  Presentation:           Breech  Placenta:               Anterior  P. Cord Insertion:      Previously Visualized  Amniotic Fluid  AFI FV:      Within  normal limits  AFI Sum(cm)     %Tile       Largest Pocket(cm)  15.5            55          4.3  RUQ(cm)       RLQ(cm)       LUQ(cm)        LLQ(cm)  4.3           2.7           4.3            4.2 ---------------------------------------------------------------------- Biophysical Evaluation  Amniotic F.V:   Within normal limits       F. Tone:        Observed  F. Movement:    Observed                   Score:          8/8  F. Breathing:   Observed ---------------------------------------------------------------------- Biometry  BPD:      70.5  mm     G. Age:  28w 2d        < 1  %    CI:         65.6   %    70 - 86                                                          FL/HC:      18.9   %    19.1 - 21.3  HC:      279.4  mm     G. Age:  30w 4d        2.7  %    HC/AC:      1.07        0.96 - 1.17  AC:      262.1  mm     G. Age:  30w 2d         13  %    FL/BPD:     74.9   %    71 - 87   FL:       52.8  mm     G. Age:  28w 1d        < 1  %    FL/AC:      20.1   %    20 - 24  HUM:      48.8  mm     G. Age:  28w 5d        < 5  %  Est. FW:    1403  gm      3 lb 1 oz      2  % ---------------------------------------------------------------------- OB History  Gravidity:    2         Term:   0        Prem:   0        SAB:   1  TOP:          0       Ectopic:  0        Living: 0 ---------------------------------------------------------------------- Gestational Age  U/S Today:     29w 2d  EDD:   06/22/20  Best:          31w 5d     Det. ByLoman Chroman         EDD:   06/05/20                                      (11/14/19) ---------------------------------------------------------------------- Anatomy  Cranium:               Appears normal         LVOT:                   Appears normal  Cavum:                 Appears normal         Aortic Arch:            Previously seen  Ventricles:            Previously seen        Ductal Arch:            Previously seen  Choroid Plexus:        Bilateral CPC prev     Diaphragm:              Previously seen                         seen  Cerebellum:            Previously seen        Stomach:                Appears normal, left                                                                        sided  Posterior Fossa:       Previously seen        Abdomen:                Previously seen  Nuchal Fold:           Previously seen        Abdominal Wall:         Previously seen  Face:                  Orbits and profile     Cord Vessels:           Previously seen                         previously seen  Lips:                  Previously seen        Kidneys:                Appear normal  Palate:                Previously seen        Bladder:  Appears normal  Thoracic:              Appears normal         Spine:                  Previously seen  Heart:                 Appears normal         Upper Extremities:      Previously  seen                         (4CH, axis, and                         situs)  RVOT:                  Previously seen        Lower Extremities:      Previously seen  Other:  Heels previously visualized. Nasal bone previously visualized. ---------------------------------------------------------------------- Doppler - Fetal Vessels  Umbilical Artery   S/D     %tile      RI    %tile                             ADFV    RDFV   3.32       81     0.7       83                                No      No ---------------------------------------------------------------------- Cervix Uterus Adnexa  Cervix  Not visualized (advanced GA >24wks)  Uterus  No abnormality visualized.  Right Ovary  Not visualized.  Left Ovary  Not visualized.  Cul De Sac  No free fluid seen.  Adnexa  No abnormality visualized. ---------------------------------------------------------------------- Myomas  Site                     L(cm)      W(cm)      D(cm)       Location  Anterior                 6.1        5.2        4  Anterior                 5.7        4.9        4.7 ----------------------------------------------------------------------  Blood Flow                  RI       PI       Comments ---------------------------------------------------------------------- Impression  Severe fetal growth restriction.  Patient returned for fetal  growth assessment and antenatal testing.  She reports good  fetal movements.  On today's ultrasound, the estimated fetal weight is at the 2nd  percentile (interval weight gain 359 g).Amniotic fluid is normal  and good fetal activity is seen .Antenatal testing is  reassuring. BPP 8/8.  Umbilical artery Doppler showed  normal forward diastolic flow.  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  I explained the significance of fetal growth restriction and the  protocol of antenatal testing.  We would recommend timing  of  delivery after next fetal growth assessment.  ---------------------------------------------------------------------- Recommendations  -Continue weekly BPP, NST and UA Doppler till delivery. ----------------------------------------------------------------------                  Tama High, MD Electronically Signed Final Report   04/08/2020 04:07 pm ----------------------------------------------------------------------  [place] MFM OB FOLLOW UP  Result Date: 04/08/2020 ----------------------------------------------------------------------  OBSTETRICS REPORT                       (Signed Final 04/08/2020 04:07 pm) ---------------------------------------------------------------------- Patient Info  ID #:       [id number]                          D.O.B.:  July 16, 1991 (29 yrs)  Name:       Sally Taylor                  Visit Date: 04/08/2020 03:10 pm              [facility] ---------------------------------------------------------------------- Performed By  Attending:        Tama High MD        Ref. Address:     [place]                                                             [place]  Performed By:     Rodrigo Ran BS      [facility] RVT                                 [facility]                                                             [facility]  Referred By:      [facility] ---------------------------------------------------------------------- Orders  #  Description                           Code        Ordered By  1  [place] MFM UA CORD DOPPLER                76820.02    YU FANG  2  [place] MFM OB FOLLOW UP                   [id number]    YU FANG  3  [place] MFM FETAL BPP WO NON               76819.01    YU FANG     STRESS  ----------------------------------------------------------------------  #  Order #                     Accession #                Episode #  1  [id number]                   [id number]                 [id number]  2  [id number]                   [id number]                 [id number]  3  [id number]                   [id number]                 [id number] ---------------------------------------------------------------------- Indications  Maternal care for known or suspected poor      O36.5930  fetal growth, third trimester, not applicable or  unspecified IUGR  Abnormal finding on antenatal screening        O28.9  (elevated AFP 2.84)  Genetic carrier (SMA)                          Z14.8  Uterine fibroids affecting pregnancy in third  O34.13, D25.9  trimester, antepartum  Cervical shortening, third trimester           O26.873  (progesterone)  Hypertension - Chronic/Pre-existing (ASA)      O10.019  [redacted] weeks gestation of pregnancy                Z3A.31 ---------------------------------------------------------------------- Fetal Evaluation  Num Of Fetuses:         1  Fetal Heart Rate(bpm):  136  Cardiac Activity:       Observed  Presentation:           Breech  Placenta:               Anterior  P. Cord Insertion:      Previously Visualized  Amniotic Fluid  AFI FV:      Within  normal limits  AFI Sum(cm)     %Tile       Largest Pocket(cm)  15.5            55          4.3  RUQ(cm)       RLQ(cm)       LUQ(cm)        LLQ(cm)  4.3           2.7           4.3            4.2 ---------------------------------------------------------------------- Biophysical Evaluation  Amniotic F.V:   Within normal limits       F. Tone:        Observed  F. Movement:    Observed                   Score:          8/8  F. Breathing:   Observed ---------------------------------------------------------------------- Biometry  BPD:      70.5  mm     G. Age:  28w 2d        < 1  %    CI:         65.6   %    70 - 86                                                           FL/HC:      18.9   %    19.1 - 21.3  HC:      279.4  mm     G. Age:  30w 4d        2.7  %    HC/AC:      1.07        0.96 - 1.17  AC:      262.1  mm     G. Age:  30w 2d         13  %    FL/BPD:     74.9   %    71 - 87  FL:       52.8  mm     G. Age:  28w 1d        < 1  %    FL/AC:      20.1   %    20 - 24  HUM:      48.8  mm     G. Age:  28w 5d        < 5  %  Est. FW:    1403  gm      3 lb 1 oz      2  % ---------------------------------------------------------------------- OB History  Gravidity:    2         Term:   0        Prem:   0        SAB:   1  TOP:          0       Ectopic:  0        Living: 0 ---------------------------------------------------------------------- Gestational Age  U/S Today:     29w 2d  EDD:   06/22/20  Best:          31w 5d     Det. ByLoman Chroman         EDD:   06/05/20                                      (11/14/19) ---------------------------------------------------------------------- Anatomy  Cranium:               Appears normal         LVOT:                   Appears normal  Cavum:                 Appears normal         Aortic Arch:            Previously seen  Ventricles:            Previously seen        Ductal Arch:            Previously seen  Choroid Plexus:        Bilateral CPC prev     Diaphragm:              Previously seen                         seen  Cerebellum:            Previously seen        Stomach:                Appears normal, left                                                                        sided  Posterior Fossa:       Previously seen        Abdomen:                Previously seen  Nuchal Fold:           Previously seen        Abdominal Wall:         Previously seen  Face:                  Orbits and profile     Cord Vessels:           Previously seen                         previously seen  Lips:                  Previously seen        Kidneys:                Appear normal  Palate:                Previously  seen        Bladder:  Appears normal  Thoracic:              Appears normal         Spine:                  Previously seen  Heart:                 Appears normal         Upper Extremities:      Previously seen                         (4CH, axis, and                         situs)  RVOT:                  Previously seen        Lower Extremities:      Previously seen  Other:  Heels previously visualized. Nasal bone previously visualized. ---------------------------------------------------------------------- Doppler - Fetal Vessels  Umbilical Artery   S/D     %tile      RI    %tile                             ADFV    RDFV   3.32       81     0.7       83                                No      No ---------------------------------------------------------------------- Cervix Uterus Adnexa  Cervix  Not visualized (advanced GA >24wks)  Uterus  No abnormality visualized.  Right Ovary  Not visualized.  Left Ovary  Not visualized.  Cul De Sac  No free fluid seen.  Adnexa  No abnormality visualized. ---------------------------------------------------------------------- Myomas  Site                     L(cm)      W(cm)      D(cm)       Location  Anterior                 6.1        5.2        4  Anterior                 5.7        4.9        4.7 ----------------------------------------------------------------------  Blood Flow                  RI       PI       Comments ---------------------------------------------------------------------- Impression  Severe fetal growth restriction.  Patient returned for fetal  growth assessment and antenatal testing.  She reports good  fetal movements.  On today's ultrasound, the estimated fetal weight is at the 2nd  percentile (interval weight gain 359 g).Amniotic fluid is normal  and good fetal activity is seen .Antenatal testing is  reassuring. BPP 8/8.  Umbilical artery Doppler showed  normal forward diastolic flow.  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  I explained the significance of fetal growth restriction and the  protocol of antenatal testing.  We would recommend timing  of  delivery after next fetal growth assessment. ---------------------------------------------------------------------- Recommendations  -Continue weekly BPP, NST and UA Doppler till delivery. ----------------------------------------------------------------------                  Tama High, MD Electronically Signed Final Report   04/08/2020 04:07 pm ----------------------------------------------------------------------  [place] MFM OB LIMITED  Result Date: 04/01/2020 ----------------------------------------------------------------------  OBSTETRICS REPORT                       (Signed Final 04/01/2020 04:55 pm) ---------------------------------------------------------------------- Patient Info  ID #:       [id number]                          D.O.B.:  October 10, 1990 (29 yrs)  Name:       Sally Taylor                  Visit Date: 04/01/2020 04:02 pm              [facility] ---------------------------------------------------------------------- Performed By  Attending:        Johnell Comings MD         Ref. Address:     [place]                                                             [place]  Performed By:     Berlinda Last          [facility]                                     [facility]  Referred By:      [facility] ---------------------------------------------------------------------- Orders  #  Description                           Code        Ordered By  1  [place] MFM UA CORD DOPPLER                 76820.02    YU FANG  2  [place] MFM OB LIMITED                     [id number]    YU FANG ----------------------------------------------------------------------  #  Order #                     Accession #                Episode #  1  [id number]                   [id number]                 [id number]  2  [id number]                   [id number]                 [id number] ---------------------------------------------------------------------- Indications  Maternal care for known or suspected poor      O36.5930  fetal growth, third trimester, not applicable or  unspecified IUGR  [redacted] weeks gestation of pregnancy                Z3A.30  Abnormal finding on antenatal screening        O28.9  (elevated AFP 2.84)  Genetic carrier (SMA)                          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  Uterine fibroids affecting pregnancy,          O34.10  unspecified trimestser  Cervical shortening, third trimester           O26.873  (progesterone)  Hypertension - Chronic/Pre-existing            O10.019 ---------------------------------------------------------------------- Fetal Evaluation  Num Of Fetuses:         1  Fetal Heart Rate(bpm):  146  Cardiac Activity:       Observed  Presentation:           Breech  Placenta:               Anterior  Amniotic Fluid  AFI FV:      Within normal limits  AFI Sum(cm)     %Tile       Largest Pocket(cm)  15.12           53          5.2  RUQ(cm)       RLQ(cm)       LUQ(cm)        LLQ(cm)  3.17  5.2           3.62           3.13 ---------------------------------------------------------------------- OB History  Gravidity:    2         Term:   0        Prem:   0        SAB:   1  TOP:          0       Ectopic:  0        Living: 0 ---------------------------------------------------------------------- Gestational Age  Best:          30w 5d     Det. ByLoman Chroman         EDD:   06/05/20                                      (11/14/19) ---------------------------------------------------------------------- Anatomy  Stomach:                Appears normal, left   Bladder:                Appears normal                         sided ---------------------------------------------------------------------- Doppler - Fetal Vessels  Umbilical Artery   S/D     %tile      RI    %tile      PI    %tile            ADFV    RDFV   3.04       65    0.67       69    1.06       75               No      No ---------------------------------------------------------------------- Comments  This patient was seen due to an IUGR fetus.  She denies any  problems since her last exam.  She reports feeling vigorous  fetal movements throughout the day.  The patient had a reactive nonstress test today.  There was normal amniotic fluid noted on today's ultrasound  exam.  Doppler studies of the umbilical arteries performed due to  fetal growth restriction showed a normal S/D ratio of 3.04.  There were no signs of absent or reversed end-diastolic flow  noted today.  A follow-up exam was scheduled in 1 week. ----------------------------------------------------------------------                   Johnell Comings, MD Electronically Signed Final Report   04/01/2020 04:55 pm ----------------------------------------------------------------------  [place] MFM UA CORD DOPPLER  Result Date: 04/17/2020 ----------------------------------------------------------------------  OBSTETRICS REPORT                    (Corrected Final 04/17/2020 08:58 am) ---------------------------------------------------------------------- Patient Info  ID #:       [id number]                          D.O.B.:  September 28, 1990 (29 yrs)  Name:       [facility]                  Visit Date: 04/15/2020 01:17 pm              [facility] ----------------------------------------------------------------------  Performed By  Attending:        Johnell Comings MD         Ref. Address:     [address] [place]                                                             [place]  Performed By:     Claudia Desanctis       [facility]                                     [facility] at                                                             [place] [facility]                                                             [facility]  Referred By:      [facility] ---------------------------------------------------------------------- Orders  #  Description                           Code        Ordered By  1  [place] MFM UA CORD DOPPLER                76820.02    RAVI SHANKAR  2  [place] MFM FETAL BPP WO NON               76819.01    RAVI [facility]  STRESS ----------------------------------------------------------------------  #  Order #                     Accession #                Episode #  1  [id number]                   [id number]                 [id number]  2  [id number]                   [id number]                 [id number] ---------------------------------------------------------------------- Indications  Maternal care for known or suspected poor      O36.5930  fetal growth, third trimester, not applicable or  unspecified IUGR  Abnormal finding on antenatal screening        O28.9  (elevated AFP 2.84)  Genetic carrier (SMA)                          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 Cord Insertion:      Visualized, central  Amniotic Fluid  AFI FV:      Within  normal limits  AFI Sum(cm)     %Tile       Largest Pocket(cm)  17.37           64          6.18  RUQ(cm)       RLQ(cm)       LUQ(cm)        LLQ(cm)  3.54          4.33          6.18           3.32 ---------------------------------------------------------------------- Biophysical Evaluation  Amniotic F.V:   Within normal limits       F. Tone:        Observed  F. Movement:    Observed                   Score:          8/8  F. Breathing:   Observed ---------------------------------------------------------------------- OB History  Gravidity:    2         Term:   0        Prem:   0        SAB:   1  TOP:          0       Ectopic:  0        Living: 0 ---------------------------------------------------------------------- Gestational Age  Best:  32w 5d     Det. ByLoman Chroman         EDD:   06/05/20                                      (11/14/19) ---------------------------------------------------------------------- Anatomy  Cranium:               Appears normal         LVOT:                   Previously seen  Cavum:                 Appears normal         Aortic Arch:            Previously seen  Ventricles:            Appears normal         Ductal Arch:            Previously seen  Choroid Plexus:        Appears normal         Stomach:                Appears normal, left                                                                        sided  Cerebellum:            Appears normal         Cord Vessels:           Appears normal (3                                                                        vessel cord)  Heart:                 Appears normal         Kidneys:                Appear normal                         (4CH, axis, and                         situs)  RVOT:                  Previously seen        Bladder:                Appears normal  Other:  Other anatomy previously imaged and appeared normal. ---------------------------------------------------------------------- Doppler - Fetal Vessels   Umbilical Artery   S/D     %tile      RI    %tile  ADFV    RDFV   3.89       96    0.74       95                                No      No ---------------------------------------------------------------------- Cervix Uterus Adnexa  Cervix  Not visualized (advanced GA >24wks) ---------------------------------------------------------------------- Comments  This patient was seen due to an IUGR fetus.  She denies any  problems since her last exam.  She reports feeling vigorous  fetal movements throughout the day.  A biophysical profile performed today was 8 out of 8.  There was normal amniotic fluid noted on today's ultrasound  exam.  Doppler studies of the umbilical arteries performed due to  fetal growth restriction showed an elevated S/D ratio of 3.89.  There were no signs of absent or reversed end-diastolic flow  noted today.  Another umbilical artery Doppler study and biophysical profile  was scheduled in 1 week. ----------------------------------------------------------------------                        Johnell Comings, MD Electronically Signed Corrected Final Report  04/17/2020 08:58 am ----------------------------------------------------------------------  [place] MFM UA CORD DOPPLER  Result Date: 04/08/2020 ----------------------------------------------------------------------  OBSTETRICS REPORT                       (Signed Final 04/08/2020 04:07 pm) ---------------------------------------------------------------------- Patient Info  ID #:       [id number]                          D.O.B.:  04-06-1991 (29 yrs)  Name:       Sally Taylor                  Visit Date: 04/08/2020 03:10 pm              [facility] ---------------------------------------------------------------------- Performed By  Attending:        Tama High MD        Ref. Address:     [place]  [place]  Performed By:     Rodrigo Ran BS      Location[facility]         [facility] RVT                                 Fetal Care at                                                             [place] for                                                             Women  Referred By:      [place] ---------------------------------------------------------------------- Orders  #  Description                           Code        Ordered By  1  [place] MFM UA CORD DOPPLER                76820.02    [place]  2  [place] MFM OB FOLLOW UP                   76816.01    YU FANG  3  [place] MFM FETAL BPP WO NON               76819.01    [place] ----------------------------------------------------------------------  #  Order #                     Accession #                Episode #  1  [id number]                   [id number]                 [id number]  2  [id number]                   [id number]                 [id number]  3  [id number]                   [id number]                 [id number] ---------------------------------------------------------------------- Indications  Maternal care for known or suspected poor      O36.5930  fetal growth, third trimester, not applicable or  unspecified IUGR  Abnormal finding on antenatal screening        O28.9  (elevated AFP 2.84)  Genetic carrier (SMA)                          Z14.8  Uterine fibroids affecting pregnancy in  third  O34.13, D25.9  trimester, antepartum  Cervical shortening, third trimester           O26.873  (progesterone)  Hypertension - Chronic/Pre-existing (ASA)      O10.019  [redacted] weeks gestation of pregnancy                Z3A.31 ---------------------------------------------------------------------- Fetal Evaluation  Num Of Fetuses:         1  Fetal Heart Rate(bpm):  136  Cardiac Activity:       Observed   Presentation:           Breech  Placenta:               Anterior  P. Cord Insertion:      Previously Visualized  Amniotic Fluid  AFI FV:      Within normal limits  AFI Sum(cm)     %Tile       Largest Pocket(cm)  15.5            55          4.3  RUQ(cm)       RLQ(cm)       LUQ(cm)        LLQ(cm)  4.3           2.7           4.3            4.2 ---------------------------------------------------------------------- Biophysical Evaluation  Amniotic F.V:   Within normal limits       F. Tone:        Observed  F. Movement:    Observed                   Score:          8/8  F. Breathing:   Observed ---------------------------------------------------------------------- Biometry  BPD:      70.5  mm     G. Age:  28w 2d        < 1  %    CI:         65.6   %    70 - 86                                                          FL/HC:      18.9   %    19.1 - 21.3  HC:      279.4  mm     G. Age:  30w 4d        2.7  %    HC/AC:      1.07        0.96 - 1.17  AC:      262.1  mm     G. Age:  30w 2d         13  %    FL/BPD:     74.9   %    71 - 87  FL:       52.8  mm     G. Age:  28w 1d        < 1  %    FL/AC:      20.1   %    20 - 24  HUM:      48.8  mm  G. Age:  28w 5d        < 5  %  Est. FW:    1403  gm      3 lb 1 oz      2  % ---------------------------------------------------------------------- OB History  Gravidity:    2         Term:   0        Prem:   0        SAB:   1  TOP:          0       Ectopic:  0        Living: 0 ---------------------------------------------------------------------- Gestational Age  U/S Today:     29w 2d                                        EDD:   06/22/20  Best:          31w 5d     Det. ByLoman Chroman         EDD:   06/05/20                                      (11/14/19) ---------------------------------------------------------------------- Anatomy  Cranium:               Appears normal         LVOT:                   Appears normal  Cavum:                 Appears normal         Aortic Arch:             Previously seen  Ventricles:            Previously seen        Ductal Arch:            Previously seen  Choroid Plexus:        Bilateral CPC prev     Diaphragm:              Previously seen                         seen  Cerebellum:            Previously seen        Stomach:                Appears normal, left                                                                        sided  Posterior Fossa:       Previously seen        Abdomen:                Previously seen  Nuchal Fold:           Previously seen  Abdominal Wall:         Previously seen  Face:                  Orbits and profile     Cord Vessels:           Previously seen                         previously seen  Lips:                  Previously seen        Kidneys:                Appear normal  Palate:                Previously seen        Bladder:                Appears normal  Thoracic:              Appears normal         Spine:                  Previously seen  Heart:                 Appears normal         Upper Extremities:      Previously seen                         (4CH, axis, and                         situs)  RVOT:                  Previously seen        Lower Extremities:      Previously seen  Other:  Heels previously visualized. Nasal bone previously visualized. ---------------------------------------------------------------------- Doppler - Fetal Vessels  Umbilical Artery   S/D     %tile      RI    %tile                             ADFV    RDFV   3.32       81     0.7       83                                No      No ---------------------------------------------------------------------- Cervix Uterus Adnexa  Cervix  Not visualized (advanced GA >24wks)  Uterus  No abnormality visualized.  Right Ovary  Not visualized.  Left Ovary  Not visualized.  Cul De Sac  No free fluid seen.  Adnexa  No abnormality visualized. ---------------------------------------------------------------------- Myomas  Site                     L(cm)       W(cm)      D(cm)       Location  Anterior                 6.1        5.2        4  Anterior  5.7        4.9        4.7 ----------------------------------------------------------------------  Blood Flow                  RI       PI       Comments ---------------------------------------------------------------------- Impression  Severe fetal growth restriction.  Patient returned for fetal  growth assessment and antenatal testing.  She reports good  fetal movements.  On today's ultrasound, the estimated fetal weight is at the 2nd  percentile (interval weight gain 359 g).Amniotic fluid is normal  and good fetal activity is seen .Antenatal testing is  reassuring. BPP 8/8.  Umbilical artery Doppler showed  normal forward diastolic flow.  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  I explained the significance of fetal growth restriction and the  protocol of antenatal testing.  We would recommend timing of  delivery after next fetal growth assessment. ---------------------------------------------------------------------- Recommendations  -Continue weekly BPP, NST and UA Doppler till delivery. ----------------------------------------------------------------------                  Tama High, MD Electronically Signed Final Report   04/08/2020 04:07 pm ----------------------------------------------------------------------  [place] MFM UA CORD DOPPLER  Result Date: 04/01/2020 ----------------------------------------------------------------------  OBSTETRICS REPORT                       (Signed Final 04/01/2020 04:55 pm) ---------------------------------------------------------------------- Patient Info  ID #:       [id number]                          D.O.B.:  Dec 06, 1990 (29 yrs)  Name:       Sally Taylor                  Visit Date: 04/01/2020 04:02 pm              [facility] ---------------------------------------------------------------------- Performed By  Attending:        Johnell Comings MD         Ref. Address:     [place]  [id number]  Performed By:     Berlinda Last          Location[facility]         [facility]                    [facility]                                     Fetal Care at                                                             [place] for                                                             Women  Referred By:      [facility] ---------------------------------------------------------------------- Orders  #  Description                           Code        Ordered By  1  [place] MFM UA CORD DOPPLER                76820.02    [place]  2  [place] MFM OB LIMITED                     [id number]    YU FANG ----------------------------------------------------------------------  #  Order #                     Accession #                Episode #  1  [id number]                   [id number]                 [id number]  2  [id number]                   [id number]                 [id number] ---------------------------------------------------------------------- Indications  Maternal care for known or suspected poor      O36.5930  fetal growth, third trimester, not applicable or  unspecified IUGR  [redacted] weeks gestation of pregnancy                Z3A.30  Abnormal finding on antenatal screening        O28.9  (elevated AFP 2.84)  Genetic carrier (SMA)                          Z14.8  Uterine fibroids affecting pregnancy,          O34.10  unspecified trimestser  Cervical shortening, third trimester           O26.873  (progesterone)  Hypertension - Chronic/Pre-existing  O10.019 ---------------------------------------------------------------------- Fetal Evaluation  Num Of Fetuses:         1  Fetal Heart Rate(bpm):  146  Cardiac  Activity:       Observed  Presentation:           Breech  Placenta:               Anterior  Amniotic Fluid  AFI FV:      Within normal limits  AFI Sum(cm)     %Tile       Largest Pocket(cm)  15.12           53          5.2  RUQ(cm)       RLQ(cm)       LUQ(cm)        LLQ(cm)  3.17          5.2           3.62           3.13 ---------------------------------------------------------------------- OB History  Gravidity:    2         Term:   0        Prem:   0        SAB:   1  TOP:          0       Ectopic:  0        Living: 0 ---------------------------------------------------------------------- Gestational Age  Best:          30w 5d     Det. ByLoman Chroman         EDD:   06/05/20                                      (11/14/19) ---------------------------------------------------------------------- Anatomy  Stomach:               Appears normal, left   Bladder:                Appears normal                         sided ---------------------------------------------------------------------- Doppler - Fetal Vessels  Umbilical Artery   S/D     %tile      RI    %tile      PI    %tile            ADFV    RDFV   3.04       65    0.67       69    1.06       75               No      No ---------------------------------------------------------------------- Comments  This patient was seen due to an IUGR fetus.  She denies any  problems since her last exam.  She reports feeling vigorous  fetal movements throughout the day.  The patient had a reactive nonstress test today.  There was normal amniotic fluid noted on today's ultrasound  exam.  Doppler studies of the umbilical arteries performed due to  fetal growth restriction showed a normal S/D ratio of 3.04.  There were no signs of absent or reversed end-diastolic flow  noted today.  A follow-up exam was scheduled in 1 week. ----------------------------------------------------------------------  Johnell Comings, MD Electronically Signed Final Report   04/01/2020  04:55 pm ----------------------------------------------------------------------   Assessment and Plan:  Pregnancy: G2P0010 at [redacted]w[redacted]d 1. Supervision of other normal pregnancy, antepartum Patient is doing well without complaints Patient undecided on contraception and pediatrician  2. Chronic hypertension Continue ASA Elevated BP today and at previous visits without HA, visual changes, or other symptoms Patient very reluctant to start antihypertensive Informed patient that if elevated at her [place] appointment today MFM may discuss medication or MAU evaluation. Patient verbalized understanding  Preterm labor symptoms and general obstetric precautions including but not limited to vaginal bleeding, contractions, leaking of fluid and fetal movement were reviewed in detail with the patient. I discussed the assessment and treatment plan with the patient. The patient was provided an opportunity to ask questions and all were answered. The patient agreed with the plan and demonstrated an understanding of the instructions. The patient was advised to call back or seek an in-person office evaluation/go to [facility] at [facility] for any urgent or concerning symptoms. Please refer to After Visit Summary for other counseling recommendations.   I provided 15 minutes of face-to-face time during this encounter.  No follow-ups on file.  Future Appointments  Date Time Provider [place]  04/22/2020 12:30 PM [facility] NURSE [facility]  04/22/2020 12:45 PM [facility] US5 [facility]  04/22/2020  2:15 PM [facility] NST [facility]  04/29/2020 12:30 PM [facility] NURSE [facility]  04/29/2020 12:45 PM [facility] US4 [facility]  04/29/2020  2:15 PM [facility] NST [facility]  05/23/2020 11:45 AM Desenglau, Tommy Rainwater, PT [facility]  05/30/2020 11:45 AM Desenglau, Tommy Rainwater, PT [facility]  06/03/2020 11:45 AM Desenglau, Tommy Rainwater, PT [facility]    Mora Bellman, MD [facility] [place] [place]

## 2020-04-22 NOTE — Procedures (Signed)
Sally Taylor 1991-04-02 [redacted]w[redacted]d  Fetus A Non-Stress Test Interpretation for 04/22/20  Indication: IUGR  Fetal Heart Rate A Mode: External Baseline Rate (A): 140 bpm Variability: Moderate Accelerations: 15 x 15 Decelerations: None Multiple birth?: No  Uterine Activity Mode: Palpation, Toco Contraction Frequency (min): UI Contraction Quality: Mild Resting Tone Palpated: Relaxed Resting Time: Adequate  Interpretation (Fetal Testing) Nonstress Test Interpretation: Reactive Comments: Dr.Shankar reviewed tracing

## 2020-04-29 ENCOUNTER — Other Ambulatory Visit: Payer: Self-pay

## 2020-04-29 ENCOUNTER — Ambulatory Visit: Payer: Medicaid Other | Admitting: *Deleted

## 2020-04-29 ENCOUNTER — Ambulatory Visit: Payer: Medicaid Other | Attending: Obstetrics and Gynecology

## 2020-04-29 ENCOUNTER — Encounter: Payer: Self-pay | Admitting: *Deleted

## 2020-04-29 ENCOUNTER — Other Ambulatory Visit: Payer: Self-pay | Admitting: *Deleted

## 2020-04-29 DIAGNOSIS — O321XX0 Maternal care for breech presentation, not applicable or unspecified: Secondary | ICD-10-CM

## 2020-04-29 DIAGNOSIS — R102 Pelvic and perineal pain: Secondary | ICD-10-CM | POA: Insufficient documentation

## 2020-04-29 DIAGNOSIS — O0990 Supervision of high risk pregnancy, unspecified, unspecified trimester: Secondary | ICD-10-CM | POA: Insufficient documentation

## 2020-04-29 DIAGNOSIS — O289 Unspecified abnormal findings on antenatal screening of mother: Secondary | ICD-10-CM | POA: Diagnosis not present

## 2020-04-29 DIAGNOSIS — O26892 Other specified pregnancy related conditions, second trimester: Secondary | ICD-10-CM

## 2020-04-29 DIAGNOSIS — O365930 Maternal care for other known or suspected poor fetal growth, third trimester, not applicable or unspecified: Secondary | ICD-10-CM | POA: Insufficient documentation

## 2020-04-29 DIAGNOSIS — O3413 Maternal care for benign tumor of corpus uteri, third trimester: Secondary | ICD-10-CM | POA: Diagnosis not present

## 2020-04-29 DIAGNOSIS — D259 Leiomyoma of uterus, unspecified: Secondary | ICD-10-CM

## 2020-04-29 DIAGNOSIS — Z3A34 34 weeks gestation of pregnancy: Secondary | ICD-10-CM

## 2020-04-29 DIAGNOSIS — O26873 Cervical shortening, third trimester: Secondary | ICD-10-CM | POA: Diagnosis not present

## 2020-04-29 DIAGNOSIS — Z148 Genetic carrier of other disease: Secondary | ICD-10-CM

## 2020-04-29 DIAGNOSIS — O10913 Unspecified pre-existing hypertension complicating pregnancy, third trimester: Secondary | ICD-10-CM

## 2020-04-29 NOTE — Procedures (Signed)
Natalyn Cherie Laberth-Brown 10-11-1990 [redacted]w[redacted]d  Fetus A Non-Stress Test Interpretation for 04/29/20  Indication: IUGR and Chronic Hypertenstion  Fetal Heart Rate A Mode: External Baseline Rate (A): 130 bpm Variability: Moderate Accelerations: 15 x 15 Decelerations: None Multiple birth?: No  Uterine Activity Mode: Palpation, Toco Contraction Frequency (min): None Resting Tone Palpated: Relaxed Resting Time: Adequate  Interpretation (Fetal Testing) Nonstress Test Interpretation: Reactive Comments: Dr. Annamaria Boots reviewed tracing.

## 2020-05-06 ENCOUNTER — Other Ambulatory Visit: Payer: Self-pay

## 2020-05-06 ENCOUNTER — Encounter: Payer: Self-pay | Admitting: Obstetrics and Gynecology

## 2020-05-06 ENCOUNTER — Ambulatory Visit (INDEPENDENT_AMBULATORY_CARE_PROVIDER_SITE_OTHER): Payer: Medicaid Other | Admitting: Obstetrics and Gynecology

## 2020-05-06 VITALS — BP 136/81 | HR 86 | Wt 236.0 lb

## 2020-05-06 DIAGNOSIS — I10 Essential (primary) hypertension: Secondary | ICD-10-CM

## 2020-05-06 DIAGNOSIS — O0990 Supervision of high risk pregnancy, unspecified, unspecified trimester: Secondary | ICD-10-CM

## 2020-05-06 DIAGNOSIS — O365930 Maternal care for other known or suspected poor fetal growth, third trimester, not applicable or unspecified: Secondary | ICD-10-CM

## 2020-05-06 NOTE — Progress Notes (Signed)
   PRENATAL VISIT NOTE  Subjective:  Sally Taylor is a 29 y.o. G2P0010 at [redacted]w[redacted]d being seen today for ongoing prenatal care.  She is currently monitored for the following issues for this high-risk pregnancy and has History of ectopic pregnancy; Supervision of high risk pregnancy, antepartum; Genetic carrier status; IUGR (intrauterine growth restriction) affecting care of mother; Pelvic pain affecting pregnancy in second trimester, antepartum; Short cervix during pregnancy in third trimester; Chronic hypertension; and Positive RPR test on their problem list.  Patient reports no complaints.  Contractions: Not present. Vag. Bleeding: None.  Movement: Present. Denies leaking of fluid.   The following portions of the patient's history were reviewed and updated as appropriate: allergies, current medications, past family history, past medical history, past social history, past surgical history and problem list.   Objective:   Vitals:   05/06/20 1007 05/06/20 1014  BP: (!) 165/104 136/81  Pulse: 86 86  Weight: 236 lb (107 kg)     Fetal Status: Fetal Heart Rate (bpm): 138   Movement: Present     General:  Alert, oriented and cooperative. Patient is in no acute distress.  Skin: Skin is warm and dry. No rash noted.   Cardiovascular: Normal heart rate noted  Respiratory: Normal respiratory effort, no problems with respiration noted  Abdomen: Soft, gravid, appropriate for gestational age.  Pain/Pressure: Absent     Pelvic: Cervical exam performed in the presence of a chaperone        Extremities: Normal range of motion.  Edema: Trace  Mental Status: Normal mood and affect. Normal behavior. Normal judgment and thought content.  Pt informed that the ultrasound is considered a limited OB ultrasound and is not intended to be a complete ultrasound exam.  Patient also informed that the ultrasound is not being completed with the intent of assessing for fetal or placental anomalies or any  pelvic abnormalities.  Explained that the purpose of today's ultrasound is to assess for  presentation.  Patient acknowledges the purpose of the exam and the limitations of the study.  Ultrasound demonstrated a fetus in breech presentation   Assessment and Plan:  Pregnancy: G2P0010 at [redacted]w[redacted]d 1. Supervision of high risk pregnancy, antepartum Patient is doing well without complaints Cultures collected  2. Chronic hypertension Severe range BP on arrival without symptoms. Repeat BP normal Reviewed si/sx of preeclampsia  3. Poor fetal growth affecting management of mother in third trimester, single or unspecified fetus EFW 3% tile on 10/11 Delivery by 37 weeks recommended Fetus in breech presentation- Patient scheduled for c-section  Preterm labor symptoms and general obstetric precautions including but not limited to vaginal bleeding, contractions, leaking of fluid and fetal movement were reviewed in detail with the patient. Please refer to After Visit Summary for other counseling recommendations.   No follow-ups on file.  Future Appointments  Date Time Provider [place]  05/13/2020  2:30 PM [facility] NURSE [facility]  05/13/2020  2:45 PM [facility] US5 [facility]  05/23/2020 11:45 AM Desenglau, Tommy Rainwater, PT [facility]  05/30/2020 11:45 AM Desenglau, Tommy Rainwater, PT [facility]  06/03/2020 11:45 AM Desenglau, Tommy Rainwater, PT [facility]    Mora Bellman, MD

## 2020-05-06 NOTE — Progress Notes (Signed)
HOB/GBS.  C/o L wrist pain 6/10 x 1 week.

## 2020-05-07 LAB — CERVICOVAGINAL ANCILLARY ONLY
Chlamydia: NEGATIVE
Comment: NEGATIVE
Comment: NORMAL
Neisseria Gonorrhea: NEGATIVE

## 2020-05-08 NOTE — Patient Instructions (Signed)
[place] Cherie Laberth-Brown  05/08/2020   Your procedure is scheduled on:  05/16/2020  Arrive at 20 at Entrance C on [address] at [facility]  and [employer]. You are invited to use the FREE valet parking or use the Visitor's parking deck.  Pick up the phone at the desk and dial [phone].  Call this number if you have problems the morning of surgery: [phone]  Remember:   Do not eat food:(After Midnight) Desps de medianoche.  Do not drink clear liquids: (After Midnight) Desps de medianoche.  Take these medicines the morning of surgery with A SIP OF WATER:  none   Do not wear jewelry, make-up or nail polish.  Do not wear lotions, powders, or perfumes. Do not wear deodorant.  Do not shave 48 hours prior to surgery.  Do not bring valuables to the hospital.  [facility] is not   responsible for any belongings or valuables brought to the hospital.  Contacts, dentures or bridgework may not be worn into surgery.  Leave suitcase in the car. After surgery it may be brought to your room.  For patients admitted to the hospital, checkout time is 11:00 AM the day of              discharge.      Please read over the following fact sheets that you were given:     Preparing for Surgery

## 2020-05-09 ENCOUNTER — Telehealth (HOSPITAL_COMMUNITY): Payer: Self-pay | Admitting: *Deleted

## 2020-05-09 ENCOUNTER — Encounter (HOSPITAL_COMMUNITY): Payer: Self-pay

## 2020-05-09 NOTE — Telephone Encounter (Signed)
Preadmission screen  

## 2020-05-10 ENCOUNTER — Telehealth (HOSPITAL_COMMUNITY): Payer: Self-pay | Admitting: *Deleted

## 2020-05-10 ENCOUNTER — Encounter (HOSPITAL_COMMUNITY): Payer: Self-pay

## 2020-05-10 LAB — CULTURE, BETA STREP (GROUP B ONLY): Strep Gp B Culture: NEGATIVE

## 2020-05-10 NOTE — Telephone Encounter (Signed)
Preadmission screen  

## 2020-05-13 ENCOUNTER — Ambulatory Visit: Payer: Medicaid Other

## 2020-05-13 ENCOUNTER — Encounter (HOSPITAL_COMMUNITY): Payer: Self-pay

## 2020-05-13 ENCOUNTER — Ambulatory Visit: Payer: Medicaid Other | Attending: Obstetrics and Gynecology

## 2020-05-14 ENCOUNTER — Encounter: Payer: Self-pay | Admitting: Obstetrics and Gynecology

## 2020-05-14 ENCOUNTER — Other Ambulatory Visit (HOSPITAL_COMMUNITY)
Admission: RE | Admit: 2020-05-14 | Discharge: 2020-05-14 | Disposition: A | Discharge status: Home / Self Care | Payer: Medicaid Other | Source: Ambulatory Visit | Attending: Obstetrics and Gynecology | Admitting: Obstetrics and Gynecology

## 2020-05-14 ENCOUNTER — Inpatient Hospital Stay (HOSPITAL_COMMUNITY): Payer: Medicaid Other | Admitting: Anesthesiology

## 2020-05-14 ENCOUNTER — Ambulatory Visit (INDEPENDENT_AMBULATORY_CARE_PROVIDER_SITE_OTHER): Payer: Medicaid Other | Admitting: Obstetrics and Gynecology

## 2020-05-14 ENCOUNTER — Encounter (HOSPITAL_COMMUNITY)
Admission: RE | Admit: 2020-05-14 | Discharge: 2020-05-14 | Disposition: A | Discharge status: Home / Self Care | Payer: Medicaid Other | Source: Ambulatory Visit | Attending: Obstetrics & Gynecology | Admitting: Obstetrics & Gynecology

## 2020-05-14 ENCOUNTER — Encounter (HOSPITAL_COMMUNITY)
Admission: AD | Disposition: A | Discharge status: Home / Self Care | Payer: Self-pay | Source: Home / Self Care | Attending: Obstetrics & Gynecology

## 2020-05-14 ENCOUNTER — Other Ambulatory Visit: Payer: Self-pay

## 2020-05-14 ENCOUNTER — Inpatient Hospital Stay (HOSPITAL_COMMUNITY)
Admission: AD | Admit: 2020-05-14 | Discharge: 2020-05-17 | DRG: 787 | Disposition: A | Discharge status: Home / Self Care | Payer: Medicaid Other | Attending: Obstetrics & Gynecology | Admitting: Obstetrics & Gynecology

## 2020-05-14 VITALS — BP 165/119 | HR 105 | Wt 248.0 lb

## 2020-05-14 DIAGNOSIS — O365930 Maternal care for other known or suspected poor fetal growth, third trimester, not applicable or unspecified: Secondary | ICD-10-CM | POA: Diagnosis present

## 2020-05-14 DIAGNOSIS — O0990 Supervision of high risk pregnancy, unspecified, unspecified trimester: Secondary | ICD-10-CM

## 2020-05-14 DIAGNOSIS — O114 Pre-existing hypertension with pre-eclampsia, complicating childbirth: Secondary | ICD-10-CM | POA: Diagnosis present

## 2020-05-14 DIAGNOSIS — Z148 Genetic carrier of other disease: Secondary | ICD-10-CM

## 2020-05-14 DIAGNOSIS — O3413 Maternal care for benign tumor of corpus uteri, third trimester: Secondary | ICD-10-CM | POA: Diagnosis present

## 2020-05-14 DIAGNOSIS — O26893 Other specified pregnancy related conditions, third trimester: Secondary | ICD-10-CM | POA: Diagnosis present

## 2020-05-14 DIAGNOSIS — O26873 Cervical shortening, third trimester: Secondary | ICD-10-CM | POA: Diagnosis present

## 2020-05-14 DIAGNOSIS — Z3A36 36 weeks gestation of pregnancy: Secondary | ICD-10-CM

## 2020-05-14 DIAGNOSIS — O9081 Anemia of the puerperium: Secondary | ICD-10-CM | POA: Diagnosis not present

## 2020-05-14 DIAGNOSIS — Z01812 Encounter for preprocedural laboratory examination: Secondary | ICD-10-CM | POA: Insufficient documentation

## 2020-05-14 DIAGNOSIS — O119 Pre-existing hypertension with pre-eclampsia, unspecified trimester: Secondary | ICD-10-CM | POA: Diagnosis present

## 2020-05-14 DIAGNOSIS — D62 Acute posthemorrhagic anemia: Secondary | ICD-10-CM | POA: Diagnosis not present

## 2020-05-14 DIAGNOSIS — O321XX0 Maternal care for breech presentation, not applicable or unspecified: Secondary | ICD-10-CM | POA: Diagnosis present

## 2020-05-14 DIAGNOSIS — Z20822 Contact with and (suspected) exposure to covid-19: Secondary | ICD-10-CM | POA: Diagnosis present

## 2020-05-14 DIAGNOSIS — O1002 Pre-existing essential hypertension complicating childbirth: Secondary | ICD-10-CM | POA: Diagnosis present

## 2020-05-14 DIAGNOSIS — O99214 Obesity complicating childbirth: Secondary | ICD-10-CM | POA: Diagnosis present

## 2020-05-14 DIAGNOSIS — Z6791 Unspecified blood type, Rh negative: Secondary | ICD-10-CM | POA: Diagnosis not present

## 2020-05-14 DIAGNOSIS — Z8759 Personal history of other complications of pregnancy, childbirth and the puerperium: Secondary | ICD-10-CM

## 2020-05-14 DIAGNOSIS — O365990 Maternal care for other known or suspected poor fetal growth, unspecified trimester, not applicable or unspecified: Secondary | ICD-10-CM | POA: Diagnosis present

## 2020-05-14 DIAGNOSIS — I10 Essential (primary) hypertension: Secondary | ICD-10-CM

## 2020-05-14 DIAGNOSIS — D252 Subserosal leiomyoma of uterus: Secondary | ICD-10-CM | POA: Diagnosis present

## 2020-05-14 DIAGNOSIS — D250 Submucous leiomyoma of uterus: Secondary | ICD-10-CM

## 2020-05-14 DIAGNOSIS — O322XX0 Maternal care for transverse and oblique lie, not applicable or unspecified: Secondary | ICD-10-CM

## 2020-05-14 DIAGNOSIS — A530 Latent syphilis, unspecified as early or late: Secondary | ICD-10-CM | POA: Diagnosis present

## 2020-05-14 LAB — RESPIRATORY PANEL BY RT PCR (FLU A&B, COVID)
Influenza A by PCR: NEGATIVE
Influenza B by PCR: NEGATIVE
SARS Coronavirus 2 by RT PCR: NEGATIVE

## 2020-05-14 LAB — COMPREHENSIVE METABOLIC PANEL
ALT: 15 U/L (ref 0–44)
AST: 15 U/L (ref 15–41)
Albumin: 2.8 g/dL — ABNORMAL LOW (ref 3.5–5.0)
Alkaline Phosphatase: 94 U/L (ref 38–126)
Anion gap: 8 (ref 5–15)
BUN: 6 mg/dL (ref 6–20)
CO2: 22 mmol/L (ref 22–32)
Calcium: 9.1 mg/dL (ref 8.9–10.3)
Chloride: 106 mmol/L (ref 98–111)
Creatinine, Ser: 0.49 mg/dL (ref 0.44–1.00)
GFR, Estimated: >60 mL/min (ref >60)
Glucose, Bld: 78 mg/dL (ref 70–99)
Potassium: 4.1 mmol/L (ref 3.5–5.1)
Sodium: 136 mmol/L (ref 135–145)
Total Bilirubin: 0.3 mg/dL (ref 0.3–1.2)
Total Protein: 6.4 g/dL — ABNORMAL LOW (ref 6.5–8.1)

## 2020-05-14 LAB — TYPE AND SCREEN
ABO/RH(D): O POS
Antibody Screen: NEGATIVE

## 2020-05-14 LAB — CBC
HCT: 33.6 % — ABNORMAL LOW (ref 36.0–46.0)
Hemoglobin: 10.7 g/dL — ABNORMAL LOW (ref 12.0–15.0)
MCH: 26.5 pg (ref 26.0–34.0)
MCHC: 31.8 g/dL (ref 30.0–36.0)
MCV: 83.2 fL (ref 80.0–100.0)
Platelets: 356 10*3/uL (ref 150–400)
RBC: 4.04 MIL/uL (ref 3.87–5.11)
RDW: 14.7 % (ref 11.5–15.5)
WBC: 9.7 10*3/uL (ref 4.0–10.5)
nRBC: 0 % (ref 0.0–0.2)

## 2020-05-14 LAB — PROTEIN / CREATININE RATIO, URINE
Creatinine, Urine: 19.65 mg/dL
Total Protein, Urine: <6 mg/dL

## 2020-05-14 LAB — SARS CORONAVIRUS 2 (TAT 6-24 HRS): SARS Coronavirus 2: NEGATIVE

## 2020-05-14 SURGERY — Surgical Case
Anesthesia: Spinal

## 2020-05-14 MED ORDER — MISOPROSTOL 25 MCG QUARTER TABLET: 25.0000 ug | ORAL_TABLET | ORAL | Status: DC | PRN

## 2020-05-14 MED ORDER — PHENYLEPHRINE 40 MCG/ML (10ML) SYRINGE FOR IV PUSH (FOR BLOOD PRESSURE SUPPORT)
PREFILLED_SYRINGE | INTRAVENOUS | Status: AC
  Filled 2020-05-14: qty 30

## 2020-05-14 MED ORDER — KETOROLAC TROMETHAMINE 30 MG/ML IJ SOLN
INTRAMUSCULAR | Status: AC
  Filled 2020-05-14: qty 1

## 2020-05-14 MED ORDER — OXYCODONE HCL 5 MG PO TABS
5.0000 mg | ORAL_TABLET | ORAL | Status: DC | PRN
  Administered 2020-05-15 – 2020-05-17 (×6): 10 mg via ORAL
  Filled 2020-05-14 (×6): qty 2

## 2020-05-14 MED ORDER — ACETAMINOPHEN 325 MG PO TABS: 650.0000 mg | ORAL_TABLET | ORAL | Status: DC | PRN

## 2020-05-14 MED ORDER — BUPIVACAINE IN DEXTROSE 0.75-8.25 % IT SOLN
INTRATHECAL | Status: DC | PRN
  Administered 2020-05-14: 1.5 mL via INTRATHECAL

## 2020-05-14 MED ORDER — FENTANYL CITRATE (PF) 100 MCG/2ML IJ SOLN
INTRAMUSCULAR | Status: AC
  Filled 2020-05-14: qty 2

## 2020-05-14 MED ORDER — NALOXONE HCL 4 MG/10ML IJ SOLN
1.0000 ug/kg/h | INTRAVENOUS | Status: DC | PRN
  Filled 2020-05-14: qty 5

## 2020-05-14 MED ORDER — IBUPROFEN 800 MG PO TABS
800.0000 mg | ORAL_TABLET | Freq: Four times a day (QID) | ORAL | Status: DC
  Administered 2020-05-15 – 2020-05-17 (×9): 800 mg via ORAL
  Filled 2020-05-14 (×10): qty 1

## 2020-05-14 MED ORDER — SODIUM CHLORIDE 0.9 % IV SOLN: 5.0000 10*6.[IU] | Freq: Once | INTRAVENOUS | Status: DC

## 2020-05-14 MED ORDER — FAMOTIDINE IN NACL 20-0.9 MG/50ML-% IV SOLN
20.0000 mg | Freq: Once | INTRAVENOUS | Status: AC
  Administered 2020-05-14: 20 mg via INTRAVENOUS
  Filled 2020-05-14: qty 50

## 2020-05-14 MED ORDER — SOD CITRATE-CITRIC ACID 500-334 MG/5ML PO SOLN: 30.0000 mL | ORAL | Status: DC

## 2020-05-14 MED ORDER — DIPHENHYDRAMINE HCL 25 MG PO CAPS: 25.0000 mg | ORAL_CAPSULE | ORAL | Status: DC | PRN

## 2020-05-14 MED ORDER — OXYTOCIN BOLUS FROM INFUSION: 333.0000 mL | Freq: Once | INTRAVENOUS | Status: DC

## 2020-05-14 MED ORDER — SIMETHICONE 80 MG PO CHEW
80.0000 mg | CHEWABLE_TABLET | Freq: Three times a day (TID) | ORAL | Status: DC
  Administered 2020-05-15 – 2020-05-17 (×7): 80 mg via ORAL
  Filled 2020-05-14 (×7): qty 1

## 2020-05-14 MED ORDER — HYDRALAZINE HCL 20 MG/ML IJ SOLN: 10.0000 mg | INTRAMUSCULAR | Status: DC | PRN

## 2020-05-14 MED ORDER — MORPHINE SULFATE (PF) 0.5 MG/ML IJ SOLN
INTRAMUSCULAR | Status: AC
Start: 2020-05-14 — End: ?
  Filled 2020-05-14: qty 10

## 2020-05-14 MED ORDER — PHENYLEPHRINE HCL-NACL 20-0.9 MG/250ML-% IV SOLN
INTRAVENOUS | Status: DC | PRN
  Administered 2020-05-14: 30 ug/min via INTRAVENOUS

## 2020-05-14 MED ORDER — OXYTOCIN-SODIUM CHLORIDE 30-0.9 UT/500ML-% IV SOLN: 2.5000 [IU]/h | INTRAVENOUS | Status: DC

## 2020-05-14 MED ORDER — NALBUPHINE HCL 10 MG/ML IJ SOLN: 5.0000 mg | INTRAMUSCULAR | Status: DC | PRN

## 2020-05-14 MED ORDER — WITCH HAZEL-GLYCERIN EX PADS: 1.0000 "application " | MEDICATED_PAD | CUTANEOUS | Status: DC | PRN

## 2020-05-14 MED ORDER — NALOXONE HCL 0.4 MG/ML IJ SOLN: 0.4000 mg | INTRAMUSCULAR | Status: DC | PRN

## 2020-05-14 MED ORDER — ONDANSETRON HCL 4 MG/2ML IJ SOLN
INTRAMUSCULAR | Status: AC
  Filled 2020-05-14: qty 2

## 2020-05-14 MED ORDER — ONDANSETRON HCL 4 MG/2ML IJ SOLN
INTRAMUSCULAR | Status: DC | PRN
  Administered 2020-05-14: 4 mg via INTRAVENOUS

## 2020-05-14 MED ORDER — DEXAMETHASONE SODIUM PHOSPHATE 4 MG/ML IJ SOLN
INTRAMUSCULAR | Status: AC
  Filled 2020-05-14: qty 1

## 2020-05-14 MED ORDER — SENNOSIDES-DOCUSATE SODIUM 8.6-50 MG PO TABS
2.0000 | ORAL_TABLET | ORAL | Status: DC
  Administered 2020-05-14 – 2020-05-16 (×3): 2 via ORAL
  Filled 2020-05-14 (×3): qty 2

## 2020-05-14 MED ORDER — ONDANSETRON HCL 4 MG/2ML IJ SOLN: 4.0000 mg | Freq: Three times a day (TID) | INTRAMUSCULAR | Status: DC | PRN

## 2020-05-14 MED ORDER — SIMETHICONE 80 MG PO CHEW
80.0000 mg | CHEWABLE_TABLET | ORAL | Status: DC
  Administered 2020-05-14 – 2020-05-16 (×3): 80 mg via ORAL
  Filled 2020-05-14 (×3): qty 1

## 2020-05-14 MED ORDER — FENTANYL CITRATE (PF) 100 MCG/2ML IJ SOLN
INTRAMUSCULAR | Status: DC | PRN
  Administered 2020-05-14: 15 ug via INTRATHECAL

## 2020-05-14 MED ORDER — ACETAMINOPHEN 500 MG PO TABS
1000.0000 mg | ORAL_TABLET | Freq: Four times a day (QID) | ORAL | Status: AC
  Administered 2020-05-14 – 2020-05-15 (×2): 1000 mg via ORAL
  Filled 2020-05-14 (×3): qty 2

## 2020-05-14 MED ORDER — LACTATED RINGERS IV SOLN: INTRAVENOUS | Status: DC | PRN

## 2020-05-14 MED ORDER — COCONUT OIL OIL
1.0000 "application " | TOPICAL_OIL | Status: DC | PRN
  Administered 2020-05-17: 1 via TOPICAL

## 2020-05-14 MED ORDER — DIPHENHYDRAMINE HCL 50 MG/ML IJ SOLN: 12.5000 mg | INTRAMUSCULAR | Status: DC | PRN

## 2020-05-14 MED ORDER — KETOROLAC TROMETHAMINE 30 MG/ML IJ SOLN: 30.0000 mg | Freq: Four times a day (QID) | INTRAMUSCULAR | Status: DC | PRN

## 2020-05-14 MED ORDER — NALBUPHINE HCL 10 MG/ML IJ SOLN: 5.0000 mg | Freq: Once | INTRAMUSCULAR | Status: DC | PRN

## 2020-05-14 MED ORDER — OXYTOCIN-SODIUM CHLORIDE 30-0.9 UT/500ML-% IV SOLN: 2.5000 [IU]/h | INTRAVENOUS | Status: AC

## 2020-05-14 MED ORDER — SOD CITRATE-CITRIC ACID 500-334 MG/5ML PO SOLN: 30.0000 mL | ORAL | Status: DC | PRN

## 2020-05-14 MED ORDER — KETOROLAC TROMETHAMINE 30 MG/ML IJ SOLN
30.0000 mg | Freq: Four times a day (QID) | INTRAMUSCULAR | Status: DC | PRN
  Administered 2020-05-14: 30 mg via INTRAMUSCULAR

## 2020-05-14 MED ORDER — OXYTOCIN-SODIUM CHLORIDE 30-0.9 UT/500ML-% IV SOLN
INTRAVENOUS | Status: DC | PRN
  Administered 2020-05-14: 200 mL via INTRAVENOUS

## 2020-05-14 MED ORDER — ACETAMINOPHEN 325 MG PO TABS
650.0000 mg | ORAL_TABLET | ORAL | Status: DC | PRN
  Administered 2020-05-15: 650 mg via ORAL
  Filled 2020-05-14: qty 2

## 2020-05-14 MED ORDER — LABETALOL HCL 5 MG/ML IV SOLN: 40.0000 mg | INTRAVENOUS | Status: DC | PRN

## 2020-05-14 MED ORDER — LABETALOL HCL 5 MG/ML IV SOLN
20.0000 mg | INTRAVENOUS | Status: DC | PRN
  Administered 2020-05-14: 20 mg via INTRAVENOUS
  Filled 2020-05-14 (×2): qty 4

## 2020-05-14 MED ORDER — LACTATED RINGERS IV SOLN: INTRAVENOUS | Status: DC

## 2020-05-14 MED ORDER — SCOPOLAMINE 1 MG/3DAYS TD PT72: 1.0000 | MEDICATED_PATCH | Freq: Once | TRANSDERMAL | Status: DC

## 2020-05-14 MED ORDER — SODIUM CHLORIDE 0.9% FLUSH: 3.0000 mL | INTRAVENOUS | Status: DC | PRN

## 2020-05-14 MED ORDER — ENOXAPARIN SODIUM 60 MG/0.6ML ~~LOC~~ SOLN
60.0000 mg | SUBCUTANEOUS | Status: DC
  Administered 2020-05-15 – 2020-05-17 (×3): 60 mg via SUBCUTANEOUS
  Filled 2020-05-14 (×3): qty 0.6

## 2020-05-14 MED ORDER — PHENYLEPHRINE HCL (PRESSORS) 10 MG/ML IV SOLN
INTRAVENOUS | Status: DC | PRN
  Administered 2020-05-14: 60 ug via INTRAVENOUS
  Administered 2020-05-14 (×3): 80 ug via INTRAVENOUS

## 2020-05-14 MED ORDER — CEFAZOLIN SODIUM-DEXTROSE 2-4 GM/100ML-% IV SOLN
2.0000 g | INTRAVENOUS | Status: AC
  Administered 2020-05-14: 2 g via INTRAVENOUS
  Filled 2020-05-14: qty 100

## 2020-05-14 MED ORDER — LACTATED RINGERS IV SOLN: 500.0000 mL | INTRAVENOUS | Status: DC | PRN

## 2020-05-14 MED ORDER — DEXAMETHASONE SODIUM PHOSPHATE 4 MG/ML IJ SOLN
INTRAMUSCULAR | Status: DC | PRN
  Administered 2020-05-14: 4 mg via INTRAVENOUS

## 2020-05-14 MED ORDER — CEFAZOLIN SODIUM-DEXTROSE 2-4 GM/100ML-% IV SOLN
INTRAVENOUS | Status: AC
  Filled 2020-05-14: qty 100

## 2020-05-14 MED ORDER — TETANUS-DIPHTH-ACELL PERTUSSIS 5-2.5-18.5 LF-MCG/0.5 IM SUSY: 0.5000 mL | PREFILLED_SYRINGE | Freq: Once | INTRAMUSCULAR | Status: DC

## 2020-05-14 MED ORDER — LIDOCAINE HCL (PF) 1 % IJ SOLN: 30.0000 mL | INTRAMUSCULAR | Status: DC | PRN

## 2020-05-14 MED ORDER — FENTANYL CITRATE (PF) 100 MCG/2ML IJ SOLN: 25.0000 ug | INTRAMUSCULAR | Status: DC | PRN

## 2020-05-14 MED ORDER — KETOROLAC TROMETHAMINE 30 MG/ML IJ SOLN: 30.0000 mg | Freq: Once | INTRAMUSCULAR | Status: DC

## 2020-05-14 MED ORDER — MENTHOL 3 MG MT LOZG: 1.0000 | LOZENGE | OROMUCOSAL | Status: DC | PRN

## 2020-05-14 MED ORDER — TERBUTALINE SULFATE 1 MG/ML IJ SOLN: 0.2500 mg | Freq: Once | INTRAMUSCULAR | Status: DC | PRN

## 2020-05-14 MED ORDER — SIMETHICONE 80 MG PO CHEW: 80.0000 mg | CHEWABLE_TABLET | ORAL | Status: DC | PRN

## 2020-05-14 MED ORDER — PRENATAL MULTIVITAMIN CH
1.0000 | ORAL_TABLET | Freq: Every day | ORAL | Status: DC
  Administered 2020-05-15 – 2020-05-17 (×3): 1 via ORAL
  Filled 2020-05-14 (×3): qty 1

## 2020-05-14 MED ORDER — DIPHENHYDRAMINE HCL 25 MG PO CAPS: 25.0000 mg | ORAL_CAPSULE | Freq: Four times a day (QID) | ORAL | Status: DC | PRN

## 2020-05-14 MED ORDER — MEASLES, MUMPS & RUBELLA VAC IJ SOLR: 0.5000 mL | Freq: Once | INTRAMUSCULAR | Status: DC

## 2020-05-14 MED ORDER — ONDANSETRON HCL 4 MG/2ML IJ SOLN: 4.0000 mg | Freq: Four times a day (QID) | INTRAMUSCULAR | Status: DC | PRN

## 2020-05-14 MED ORDER — OXYTOCIN-SODIUM CHLORIDE 30-0.9 UT/500ML-% IV SOLN
INTRAVENOUS | Status: AC
  Filled 2020-05-14: qty 500

## 2020-05-14 MED ORDER — KETOROLAC TROMETHAMINE 30 MG/ML IJ SOLN
30.0000 mg | Freq: Four times a day (QID) | INTRAMUSCULAR | Status: AC
  Administered 2020-05-15: 30 mg via INTRAVENOUS
  Filled 2020-05-14 (×2): qty 1

## 2020-05-14 MED ORDER — PHENYLEPHRINE HCL-NACL 20-0.9 MG/250ML-% IV SOLN
INTRAVENOUS | Status: AC
  Filled 2020-05-14: qty 250

## 2020-05-14 MED ORDER — LABETALOL HCL 5 MG/ML IV SOLN: 80.0000 mg | INTRAVENOUS | Status: DC | PRN

## 2020-05-14 MED ORDER — ACETAMINOPHEN 10 MG/ML IV SOLN: 1000.0000 mg | Freq: Once | INTRAVENOUS | Status: DC | PRN

## 2020-05-14 MED ORDER — SOD CITRATE-CITRIC ACID 500-334 MG/5ML PO SOLN
30.0000 mL | Freq: Once | ORAL | Status: AC
  Administered 2020-05-14: 30 mL via ORAL
  Filled 2020-05-14: qty 15

## 2020-05-14 MED ORDER — FENTANYL CITRATE (PF) 100 MCG/2ML IJ SOLN: 50.0000 ug | INTRAMUSCULAR | Status: DC | PRN

## 2020-05-14 MED ORDER — DIBUCAINE (PERIANAL) 1 % EX OINT: 1.0000 "application " | TOPICAL_OINTMENT | CUTANEOUS | Status: DC | PRN

## 2020-05-14 MED ORDER — MORPHINE SULFATE (PF) 0.5 MG/ML IJ SOLN
INTRAMUSCULAR | Status: DC | PRN
  Administered 2020-05-14: 150 ug via INTRATHECAL

## 2020-05-14 MED ORDER — PENICILLIN G POT IN DEXTROSE 60000 UNIT/ML IV SOLN: 3.0000 10*6.[IU] | INTRAVENOUS | Status: DC

## 2020-05-14 SURGICAL SUPPLY — 34 items
APL SKNCLS STERI-STRIP NONHPOA (GAUZE/BANDAGES/DRESSINGS) ×1
BENZOIN TINCTURE PRP APPL 2/3 (GAUZE/BANDAGES/DRESSINGS) ×1 IMPLANT
CHLORAPREP W/TINT 26ML (MISCELLANEOUS) ×2 IMPLANT
CLAMP CORD UMBIL (MISCELLANEOUS) IMPLANT
CLOSURE STERI STRIP 1/2 X4 (GAUZE/BANDAGES/DRESSINGS) ×1 IMPLANT
DRSG OPSITE POSTOP 4X10 (GAUZE/BANDAGES/DRESSINGS) ×2 IMPLANT
ELECT REM PT RETURN 9FT ADLT (ELECTROSURGICAL) ×2
ELECTRODE REM PT RTRN 9FT ADLT (ELECTROSURGICAL) ×1 IMPLANT
EXTRACTOR VACUUM M CUP 4 TUBE (SUCTIONS) IMPLANT
GLOVE BIOGEL PI IND STRL 6.5 (GLOVE) ×1 IMPLANT
GLOVE BIOGEL PI IND STRL 7.0 (GLOVE) ×1 IMPLANT
GLOVE BIOGEL PI INDICATOR 6.5 (GLOVE) ×1
GLOVE BIOGEL PI INDICATOR 7.0 (GLOVE) ×1
GLOVE SURG SS PI 6.5 STRL IVOR (GLOVE) ×2 IMPLANT
GOWN STRL REUS W/TWL LRG LVL3 (GOWN DISPOSABLE) ×4 IMPLANT
HOVERMATT SINGLE USE (MISCELLANEOUS) ×1 IMPLANT
KIT ABG SYR 3ML LUER SLIP (SYRINGE) IMPLANT
NDL HYPO 25X5/8 SAFETYGLIDE (NEEDLE) IMPLANT
NEEDLE HYPO 25X5/8 SAFETYGLIDE (NEEDLE) IMPLANT
NS IRRIG 1000ML POUR BTL (IV SOLUTION) ×2 IMPLANT
PACK C SECTION WH (CUSTOM PROCEDURE TRAY) ×2 IMPLANT
PAD OB MATERNITY 4.3X12.25 (PERSONAL CARE ITEMS) ×2 IMPLANT
PENCIL SMOKE EVAC W/HOLSTER (ELECTROSURGICAL) ×2 IMPLANT
RETRACTOR TRAXI PANNICULUS (MISCELLANEOUS) IMPLANT
RTRCTR C-SECT PINK 25CM LRG (MISCELLANEOUS) IMPLANT
SUT PLAIN 0 NONE (SUTURE) IMPLANT
SUT PLAIN 2 0 (SUTURE) ×2
SUT PLAIN ABS 2-0 CT1 27XMFL (SUTURE) IMPLANT
SUT VIC AB 0 CT1 36 (SUTURE) ×9 IMPLANT
SUT VIC AB 4-0 KS 27 (SUTURE) ×2 IMPLANT
TOWEL OR 17X24 6PK STRL BLUE (TOWEL DISPOSABLE) ×2 IMPLANT
TRAXI PANNICULUS RETRACTOR (MISCELLANEOUS) ×1
TRAY FOLEY W/BAG SLVR 14FR LF (SET/KITS/TRAYS/PACK) ×2 IMPLANT
WATER STERILE IRR 1000ML POUR (IV SOLUTION) ×2 IMPLANT

## 2020-05-14 NOTE — H&P (Signed)
Sally Taylor is a 29 y.o. female, G2P0010 at 36.6 weeks, presenting for primary C/S.  Patient was seen today for her regularly scheduled prenatal visit and arrived to the office with elevated blood pressures.  Patient was sent to MAU for direct admit, but after provider review of chart found breech presentation per 10/18 office note.  Provider to bedside and BSUS confirms fetal malpresentation with fetus transverse with cephalic on maternal left.    Patient receives care at [facility] and was supervised for a High risk pregnancy. Pregnancy and medical history significant for problems as listed below. She is GBS unknown.  She is anticipating a female infant and requests no method for PP birth control.     Patient Active Problem List   Diagnosis Date Noted  . Chronic hypertension with superimposed preeclampsia 05/14/2020  . 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 Chronic hypertension 02/14/2020  . Short cervix during pregnancy in third trimester 02/12/2020  . Pelvic pain affecting pregnancy in second trimester, antepartum 02/07/2020  . IUGR (intrauterine growth restriction) affecting care of mother 02/06/2020  . 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 Supervision of high risk pregnancy, antepartum 10/31/2019  . History of ectopic pregnancy 08/28/2019    History of present pregnancy:  Last evaluation:  05/14/2020 by Dr. Dolphus Jenny at [facility].    Nursing Staff Provider  Office Location  [facility] Dating   10wk [place]  Language  English Anatomy US  normal  Flu Vaccine   Genetic Screen  NIPS:  Low risk female  AFP:     TDaP vaccine   given 03/13/20 Hgb A1C or  GTT Early  Third trimester   Rhogam  O Positive   LAB RESULTS     Blood Type --/--/PENDING (10/26 1012)   Feeding Plan Breast/Bottle Antibody PENDING (10/26 1012)  Contraception No Rubella 4.20 (05/07 1156)  Circumcision Yes RPR Reactive (08/25 0927)   Pediatrician  Unsure HBsAg Negative (05/07 1156)   Support Person FOB HCVAb  Negative**Positive  Prenatal Classes  HIV Non Reactive (08/25 9163)     BTL Consent  GBS Negative/-- (10/18 0104)negative (For PCN allergy, check sensitivities)   VBAC Consent  Pap     Hgb Electro    BP Cuff Given one from office supply CF     SMA    Covid vaccine  Declined 03/13/20 Waterbirth  [ ]  Class [ ]  Consent [ ]  CNM visit    OB History    Gravida  2   Para  0   Term  0   Preterm  0   AB  1   Living  0     SAB  1   TAB  0   Ectopic  0   Multiple  0   Live Births  0           Past Medical History:  Diagnosis Date  . Asthma   . Headache   . Hypertension   . Pelvic pain affecting pregnancy in second trimester, antepartum 02/07/2020   Past Surgical History:  Procedure Laterality Date  . LAPAROSCOPIC APPENDECTOMY N/A 04/01/2017   Procedure: APPENDECTOMY LAPAROSCOPIC;  Surgeon: Johnathan Hausen, MD;  Location: [facility]S;  Service: General;  Laterality: N/A;   Family History: family history includes Hypertension in her mother. Social History:  reports that she has never smoked. She has never used smokeless tobacco. She reports previous alcohol use. She reports that she does not use drugs.   Prenatal Transfer Tool  Maternal Diabetes: No  Genetic Screening: Normal Maternal Ultrasounds/Referrals: IUGR Fetal Ultrasounds or other Referrals:  Referred to Materal Fetal Medicine  Maternal Substance Abuse:  No Significant Maternal Medications:  None Significant Maternal Lab Results: Rh negative   Maternal Assessment:  ROS: -Contractions, -LOF, -Vaginal Bleeding, +Fetal Movement  All other systems reviewed and negative.    No Known Allergies     Blood pressure (!) 145/94, pulse 87, unknown if currently breastfeeding. Vitals:   05/14/20 1420 05/14/20 1431 05/14/20 1504 05/14/20 1516  BP: (!) 143/84 (!) 143/93 (!) 152/90 (!) 145/94  Pulse: 84 82 83 87    Physical Exam Constitutional:      Appearance: Normal appearance.  HENT:     Head: Normocephalic  and atraumatic.  Eyes:     Conjunctiva/sclera: Conjunctivae normal.  Cardiovascular:     Rate and Rhythm: Normal rate and regular rhythm.     Heart sounds: Normal heart sounds.  Pulmonary:     Effort: Pulmonary effort is normal. No respiratory distress.     Breath sounds: Normal breath sounds.  Abdominal:     General: Bowel sounds are normal.     Tenderness: There is no abdominal tenderness.     Comments: Gravid: Appears AGA  Musculoskeletal:        General: Normal range of motion.     Cervical back: Normal range of motion.  Skin:    General: Skin is warm and dry.  Neurological:     Mental Status: She is alert and oriented to person, place, and time.  Psychiatric:        Mood and Affect: Mood normal.        Behavior: Behavior normal.        Thought Content: Thought content normal.    Fetal Assessment: FHR: 135 bpm, Mod Var, -Decels, -Accels UCs:  None graphed   Assessment IUP at 36.6 weeks Cat I FT IUGR CHTN with SI PreEclampsia Fetal Malpresentation  Plan: -BSUS confirms transverse lie. -Patient states she was aware and had already planned for c/s. -Provider gave brief discussion regarding c/s risks.  -Patient with elevated blood pressure and PreEclampsia Set ordered.  -Dr. Nelta Numbers contacted and informed of patient arrival and status.  *Advised to collect labs and prepare patient for OR.  If labs return significant for SF will proceed with MgSO4 infusion and emergent C/S. -MAU team updated on POC. -Will await results.   Maryann Conners, MSN, CNM 05/14/2020, 12:33 PM   Reassessment (3:30 PM) -Nurse reports patient s/p one dose of labetalol with improvement of blood pressures. -Dr. Jimmye Norman calls. Labs and interventions reviewed. *Patient PCR too low to detect. *CMP with normal AST/ALT, Creatinine, and BUN *CBC Pending -States will not give MgSO4 as patient without severe features.  -Dr. Jimmye Norman states he will go to bedside to consent patient for primary  c/s considering fetal malpresentation.  Maryann Conners MSN, CNM Advanced Practice Provider, Center for [employer]

## 2020-05-14 NOTE — Consult Note (Signed)
Delivery Note:  C-section       05/14/2020  7:09 PM  I was called to the operating room at the request of the patient's obstetrician (Dr. Jimmye Norman) for a primary c-section.  PRENATAL HX:  This is a 29 y/o G2P0010 at 80 and 6/[redacted] weeks gestation who was admitted for delivery due to preeclampsia.  Delivery by c-section due to breech presentation.  Her pregnancy has been complicated by chronic hypertension and IUGR.  AROM at delivery  DELIVERY:  Infant was not vigorous at delivery, so cord clamping not delayed.  However, she responded well to standard warming, drying and stimulation.  A pulse oximeter was applied and O2 saturations were in mid 90s by 5 minutes of age in RA.  APGARs 7 and 9.  Exam within normal limits.  After 5 minutes, baby left with nurse to assist parents with skin-to-skin care.   _____________________ Electronically Signed By: Clinton Gallant, MD Neonatologist

## 2020-05-14 NOTE — Op Note (Signed)
Sally Taylor PROCEDURE DATE: 05/14/2020  PREOPERATIVE DIAGNOSES: Intrauterine pregnancy at [redacted]w[redacted]d weeks gestation; malpresentation: transverse  POSTOPERATIVE DIAGNOSES: The same  PROCEDURE: Primary Low Transverse Cesarean Section  SURGEON:  Dr. Juanna Cao  ASSISTANT: Dr. Corliss Blacker  ANESTHESIOLOGY TEAM: Anesthesiologist: Janeece Riggers, MD CRNA: Riki Sheer, CRNA; Rhymer, Waynard Reeds, CRNA  INDICATIONS: Sally Taylor is a 29 y.o. G2P0010 at [redacted]w[redacted]d here for cesarean section secondary to the indications listed under preoperative diagnoses; please see preoperative note for further details.  The risks of cesarean section were discussed with the patient including but were not limited to: bleeding which may require transfusion or reoperation; infection which may require antibiotics; injury to bowel, bladder, ureters or other surrounding organs; injury to the fetus; need for additional procedures including hysterectomy in the event of a life-threatening hemorrhage; placental abnormalities wth subsequent pregnancies, incisional problems, thromboembolic phenomenon and other postoperative/anesthesia complications.   The patient concurred with the proposed plan, giving informed written consent for the procedure.    FINDINGS:  Viable female infant in cephalic presentation.  Apgars 7 and 9.  Clear amniotic fluid.  Intact placenta, three vessel cord.  Anterior approximately 3cm subserosal and approx 2cm left subserosal fibroids noted. Normal fallopian tubes and ovaries bilaterally.  ANESTHESIA: Spinal  INTRAVENOUS FLUIDS: 1000 ml   ESTIMATED BLOOD LOSS: 406 ml URINE OUTPUT:  50 ml SPECIMENS: Placenta sent to pathology COMPLICATIONS: None immediate  PROCEDURE IN DETAIL:  The patient preoperatively received intravenous antibiotics and had sequential compression devices applied to her lower extremities.  She was then taken to the operating room where spinal  anesthesia was administered and was found to be adequate. She was then placed in a dorsal supine position with a leftward tilt, and prepped and draped in a sterile manner.  A foley catheter was placed into her bladder and attached to constant gravity.  After an adequate timeout was performed, a Pfannenstiel skin incision was made with scalpel and carried through to the underlying layer of fascia. The fascia was incised in the midline, and this incision was extended bilaterally using the Mayo scissors and bluntly.  Kocher clamps were applied to the superior aspect of the fascial incision and the underlying rectus muscles were dissected off bluntly. The rectus muscles were separated in the midline and the peritoneum was entered bluntly. The Alexis self-retaining retractor was introduced into the abdominal cavity.  Attention was turned to the lower uterine segment where a low transverse hysterotomy was made with a scalpel and extended bilaterally bluntly.  The infant was found to be transverse and was delivered in breech presentation. A tight nuchal x2 was noted on delivery. The infant was successfully delivered, the cord was clamped immediately and cut, and the infant was handed over to the awaiting neonatology team. Uterine massage was then administered, and the placenta delivered intact with a three-vessel cord. The uterus was then cleared of clots and debris.  The hysterotomy was closed with 0 Vicryl in a running locked fashion, and an imbricating layer was also placed with 0 Vicryl. Omega stitch with 0 Vicryl was placed to help with hemostasis.  The pelvis was cleared of all clot and debris. Hemostasis was confirmed on all surfaces.  The retractor was removed.  The peritoneum was closed with a 0 Vicryl running stitch and the rectus muscles were reapproximated using 0 Vicryl interrupted stitches. The fascia was then closed using 0 Vicryl in a running fashion.  The subcutaneous layer was irrigated, reapproximated  with 2-0 plain gut  running stitches, and the skin was closed with a 4-0 Vicryl subcuticular stitch. The patient tolerated the procedure well. Sponge, instrument and needle counts were correct x 3.  She was taken to the recovery room in stable condition.   Arrie Senate, MD OB Fellow, [place] for [place] 05/14/2020 7:26 PM

## 2020-05-14 NOTE — Discharge Summary (Signed)
Postpartum Discharge Summary      Patient Name: Sally Taylor DOB: 08-28-1990 MRN: [id number]  Date of admission: 05/14/2020 Delivery date:05/14/2020  Delivering provider: Juanna Cao T  Date of discharge: 05/17/2020  Admitting diagnosis: Chronic hypertension with superimposed preeclampsia [O11.9] Intrauterine pregnancy: [redacted]w[redacted]d     Secondary diagnosis:  Active Problems:   History of ectopic pregnancy   Supervision of high risk pregnancy, antepartum   Genetic carrier status   IUGR (intrauterine growth restriction) affecting care of mother   Short cervix during pregnancy in third trimester   Chronic hypertension   Positive RPR test   Chronic hypertension with superimposed preeclampsia   Cesarean delivery delivered  Additional problems: none    Discharge diagnosis: Preterm Pregnancy Delivered and CHTN with superimposed preeclampsia                                              Post partum procedures:none Augmentation: N/A Complications: None  Hospital course: Scheduled C/S   29 y.o. yo G2P0010 at [redacted]w[redacted]d was admitted to the hospital 05/14/2020 for scheduled cesarean section with the following indication:Malpresentation.Delivery details are as follows:  Membrane Rupture Time/Date: 6:30 PM ,05/14/2020   Delivery Method:C-Section, Low Transverse  Details of operation can be found in separate operative note.  Patient had an uncomplicated postpartum course.  She is ambulating, tolerating a regular diet, passing flatus, and urinating well. Patient is discharged home in stable condition on  05/17/20        Newborn Data: Birth date:05/14/2020  Birth time:6:33 PM  Gender:Female  Living status:  Apgars:7 ,9  Weight:2220 g     Magnesium Sulfate received: No BMZ received: No Rhophylac:N/A MMR:N/A T-DaP:Given prenatally Flu: No Transfusion:No  Physical exam  Vitals:   05/16/20 1842 05/16/20 2225 05/17/20 0553 05/17/20 1008  BP: (!) 145/81 135/75 138/85  139/68  Pulse: 88 85 79   Resp: $Remo'20 18 18   'DEApW$ Temp: 98.7 F (37.1 C) 98.2 F (36.8 C) (!) 97.3 F (36.3 C)   TempSrc: Oral Oral Oral   SpO2:       General: alert and cooperative Lochia: appropriate Uterine Fundus: firm Incision: Dressing is clean, dry, and intact DVT Evaluation: No evidence of DVT seen on physical exam. Labs: Lab Results  Component Value Date   WBC 14.5 (H) 05/15/2020   HGB 8.9 (L) 05/15/2020   HCT 28.6 (L) 05/15/2020   MCV 84.4 05/15/2020   PLT 314 05/15/2020   CMP Latest Ref Rng & Units 05/14/2020  Glucose 70 - 99 mg/dL 78  BUN 6 - 20 mg/dL 6  Creatinine 0.44 - 1.00 mg/dL 0.49  Sodium 135 - 145 mmol/L 136  Potassium 3.5 - 5.1 mmol/L 4.1  Chloride 98 - 111 mmol/L 106  CO2 22 - 32 mmol/L 22  Calcium 8.9 - 10.3 mg/dL 9.1  Total Protein 6.5 - 8.1 g/dL 6.4(L)  Total Bilirubin 0.3 - 1.2 mg/dL 0.3  Alkaline Phos 38 - 126 U/L 94  AST 15 - 41 U/L 15  ALT 0 - 44 U/L 15   Edinburgh Score: Edinburgh Postnatal Depression Scale Screening Tool 05/15/2020  I have been able to laugh and see the funny side of things. 0  I have looked forward with enjoyment to things. 0  I have blamed myself unnecessarily when things went wrong. 0  I have been anxious or worried for  no good reason. 2  I have felt scared or panicky for no good reason. 1  Things have been getting on top of me. 1  I have been so unhappy that I have had difficulty sleeping. 0  I have felt sad or miserable. 0  I have been so unhappy that I have been crying. 1  The thought of harming myself has occurred to me. 0  Edinburgh Postnatal Depression Scale Total 5     After visit meds:  Allergies as of 05/17/2020   No Known Allergies     Medication List    STOP taking these medications   aspirin EC 81 MG tablet   Blood Pressure Kit   progesterone 200 MG capsule Commonly known as: PROMETRIUM     TAKE these medications   acetaminophen 325 MG tablet Commonly known as: TYLENOL Take 3 tablets (975  mg total) by mouth every 6 (six) hours as needed for mild pain, moderate pain or headache. What changed: how much to take   amLODipine 10 MG tablet Commonly known as: NORVASC Take 1 tablet (10 mg total) by mouth daily.   ferrous sulfate 325 (65 FE) MG tablet Take 1 tablet (325 mg total) by mouth every other day.   ibuprofen 800 MG tablet Commonly known as: ADVIL Take 1 tablet (800 mg total) by mouth every 6 (six) hours.   oxyCODONE 5 MG immediate release tablet Commonly known as: Oxy IR/ROXICODONE Take 1-2 tablets (5-10 mg total) by mouth every 4 (four) hours as needed for moderate pain.   PrePLUS 27-1 MG Tabs Take 1 tablet by mouth daily. What changed: additional instructions        Discharge home in stable condition Infant Feeding: Bottle and Breast Infant Disposition:home with mother Discharge instruction: per After Visit Summary and Postpartum booklet. Activity: Advance as tolerated. Pelvic rest for 6 weeks.  Diet: routine diet Future Appointments: Future Appointments  Date Time Provider [place]  05/21/2020 10:40 AM [place] None  05/23/2020 11:45 AM Desenglau, Tommy Rainwater, PT [facility]  05/30/2020 11:45 AM Desenglau, Tommy Rainwater, PT [facility]  06/03/2020 11:45 AM Desenglau, Tommy Rainwater, PT [facility]  06/17/2020  1:00 PM Sloan Leiter, MD [place] None   Follow up Visit:  [place] Follow up on 05/21/2020.   Specialty: Obstetrics and Gynecology Why: for blood pressure check (can be virtual if you have a blood pressure cuff) Contact information: [address], [place] [phone]             message sent to [place] 10/26   Please schedule this patient for a In person postpartum visit in 4 weeks with the following provider: Any provider. Additional Postpartum F/U:Incision check 1 week and BP check 1 week  High risk  pregnancy complicated by: HTN Delivery mode:  C-Section, Low Transverse  Anticipated Birth Control:  declines   05/17/2020 Myrtis Ser, CNM  11:26 AM

## 2020-05-14 NOTE — Progress Notes (Signed)
ROB   GBS NEGATIVE on 05/06/20  CC: None   B/P elevated in rt arm pt denies any HA, no visual changes , no dizziness mild swelling in ankles. B/P repeated in Lt arm still elevated.

## 2020-05-14 NOTE — Discharge Instructions (Signed)

## 2020-05-14 NOTE — Lactation Note (Addendum)
This note was copied from a baby's chart. Lactation Consultation Note Baby 5 hrs old. Mom holding baby STS. Has been cool. Placed baby STS in football position, w/cover placed over baby, hat on head. Baby latched well after LC softened areola and nipple. Breast tissue w/some edema. Softened well w/finger stimulation. Baby tongue thrusting at times. LC needed to use t-cup hold to maintain deep latch. Baby BF well for 15 minutes. No swallows heard.  Mom has Large very heavy breast. Encouraged mom to wear bra in am for support.  Hand expression demonstrated collecting 0.5 ml colostrum. Given w/spoon.  LPI information sheet given to mom discussing importance of supplementing after BF d/t baby LBW 4.14 lbs. Always give colostrum 1st then can use Donor milk or 22 cal. Formula. Mom choose formula. Similac 22 cal. Given via bottle w/purple nipple. Pace feeding taught. Baby took bottle w/purple nipple well.  Mom understands LPI behavior, feeding habits, importance of STS, I&O, breast massage, hand expression, and milk storage. Discussed building milk supply and storage.  Mom shown how to use DEBP & how to disassemble, clean, & reassemble parts. Mom knows to pump q3h for 15-20 min. Mom used pump w/glistening to rim of flange.  RN into assess baby. Baby still has low temp. Needing to go under warmer. RN verified arm bands. LC took baby to CN. CN RN took baby.  Encouraged to call for assistance or questions. Lactation brochure given. Mom has Wade. Mom doesn't have DEBP at home. LC offered to send [facility] referral for DEBP. Mom appreciative.   Patient Name: Sally Taylor [id number] Date: 05/14/2020 Reason for consult: Initial assessment;Late-preterm 34-36.6wks;Primapara;Infant < 6lbs   Maternal Data Has patient been taught Hand Expression?: Yes Does the patient have breastfeeding experience prior to this delivery?: No  Feeding Feeding Type: Breast Milk Nipple Type: Nfant Slow Flow  (purple)  LATCH Score Latch: Grasps breast easily, tongue down, lips flanged, rhythmical sucking.  Audible Swallowing: None  Type of Nipple: Everted at rest and after stimulation  Comfort (Breast/Nipple): Soft / non-tender  Hold (Positioning): Full assist, staff holds infant at breast  LATCH Score: 6  Interventions Interventions: Breast feeding basics reviewed;Support pillows;Assisted with latch;Position options;Skin to skin;Expressed milk;Breast massage;Hand express;Pre-pump if needed;Reverse pressure;Hand pump;DEBP;Breast compression;Adjust position  Lactation Tools Discussed/Used Tools: Pump;Bottle Breast pump type: Double-Electric Breast Pump WIC Program: Yes Pump Review: Setup, frequency, and cleaning;Milk Storage Initiated by:: Allayne Stack RN IBCLC Date initiated:: 05/14/20   Consult Status Consult Status: Follow-up Date: 05/15/20 Follow-up type: In-patient    Theodoro Kalata 05/14/2020, 11:44 PM

## 2020-05-14 NOTE — Transfer of Care (Signed)
Immediate Anesthesia Transfer of Care Note  Patient: Sally Taylor  Procedure(s) Performed: CESAREAN SECTION (N/A )  Patient Location: PACU  Anesthesia Type:Spinal  Level of Consciousness: awake, alert  and oriented  Airway & Oxygen Therapy: Patient Spontanous Breathing  Post-op Assessment: Report given to RN and Post -op Vital signs reviewed and stable  Post vital signs: Reviewed and stable  Last Vitals:  Vitals Value Taken Time  BP    Temp    Pulse    Resp    SpO2      Last Pain: There were no vitals filed for this visit.       Complications: No complications documented.

## 2020-05-14 NOTE — MAU Note (Signed)
Pt sent from office for elevated B/p. Chronic HTN and IUGR. Possible Direct admit to L&D but pt was brech on last exam. Scaned by J.Emly,CNM and baby is transverse. Placed on EFM and will start IV and LAbs as ordered. Last ate at 9am

## 2020-05-14 NOTE — Anesthesia Procedure Notes (Signed)
Spinal  Patient location during procedure: OR Start time: 05/14/2020 6:00 PM End time: 05/14/2020 6:05 PM Staffing Anesthesiologist: Janeece Riggers, MD Preanesthetic Checklist Completed: patient identified, IV checked, site marked, risks and benefits discussed, surgical consent, monitors and equipment checked, pre-op evaluation and timeout performed Spinal Block Patient position: sitting Prep: DuraPrep Patient monitoring: heart rate, cardiac monitor, continuous pulse ox and blood pressure Approach: midline Location: L3-4 Injection technique: single-shot Needle Needle type: Sprotte  Needle gauge: 24 G Needle length: 9 cm Assessment Sensory level: T4

## 2020-05-14 NOTE — Anesthesia Postprocedure Evaluation (Signed)
Anesthesia Post Note  Patient: Sally Taylor  Procedure(s) Performed: CESAREAN SECTION (N/A )     Patient location during evaluation: PACU Anesthesia Type: Spinal Level of consciousness: oriented and awake and alert Pain management: pain level controlled Vital Signs Assessment: post-procedure vital signs reviewed and stable Respiratory status: spontaneous breathing, respiratory function stable and patient connected to nasal cannula oxygen Cardiovascular status: blood pressure returned to baseline and stable Postop Assessment: no headache, no backache and no apparent nausea or vomiting Anesthetic complications: no   No complications documented.  Last Vitals:  Vitals:   05/14/20 2030 05/14/20 2033  BP:  138/85  Pulse: 86 74  Resp: 20 19  Temp:    SpO2: 99% 100%    Last Pain:  Vitals:   05/14/20 2035  PainSc: 0-No pain   Pain Goal:                   Megen Madewell

## 2020-05-14 NOTE — Anesthesia Preprocedure Evaluation (Addendum)
Anesthesia Evaluation  Patient identified by MRN, date of birth, ID band Patient awake    Reviewed: Allergy & Precautions, NPO status , Patient's Chart, lab work & pertinent test results  Airway Mallampati: III  TM Distance: >3 FB Neck ROM: Full    Dental no notable dental hx.    Pulmonary asthma , Patient abstained from smoking.,    Pulmonary exam normal breath sounds clear to auscultation       Cardiovascular hypertension, negative cardio ROS Normal cardiovascular exam Rhythm:Regular Rate:Normal     Neuro/Psych  Headaches, negative psych ROS   GI/Hepatic negative GI ROS, Neg liver ROS,   Endo/Other  Morbid obesity (BMI 45)  Renal/GU negative Renal ROS  negative genitourinary   Musculoskeletal negative musculoskeletal ROS (+)   Abdominal   Peds  Hematology  (+) Blood dyscrasia (Hgb 10.7), anemia ,   Anesthesia Other Findings Primary C/S for transverse lie and cHTN with superimposed preE  Reproductive/Obstetrics (+) Pregnancy                             Anesthesia Physical Anesthesia Plan  ASA: III  Anesthesia Plan: Spinal   Post-op Pain Management:    Induction:   PONV Risk Score and Plan: Treatment may vary due to age or medical condition  Airway Management Planned: Natural Airway  Additional Equipment:   Intra-op Plan:   Post-operative Plan:   Informed Consent: I have reviewed the patients History and Physical, chart, labs and discussed the procedure including the risks, benefits and alternatives for the proposed anesthesia with the patient or authorized representative who has indicated his/her understanding and acceptance.     Dental advisory given  Plan Discussed with: CRNA  Anesthesia Plan Comments:         Anesthesia Quick Evaluation

## 2020-05-14 NOTE — MAU Note (Addendum)
The following have been notified about patient and that the patient will be going to OR.    OB OR Lisa, RN at [employer] house coverage at [place] at [phone]  Anesthesiologist at [phone]

## 2020-05-14 NOTE — Progress Notes (Signed)
   PRENATAL VISIT NOTE  Subjective:  Sally Taylor is a 29 y.o. G2P0010 at [redacted]w[redacted]d being seen today for ongoing prenatal care.  She is currently monitored for the following issues for this high-risk pregnancy and has History of ectopic pregnancy; Supervision of high risk pregnancy, antepartum; Genetic carrier status; IUGR (intrauterine growth restriction) affecting care of mother; Pelvic pain affecting pregnancy in second trimester, antepartum; Short cervix during pregnancy in third trimester; Chronic hypertension; and Positive RPR test on their problem list.  Patient reports No complaints.  Patient denies any HAs, visual changes, CP, SOB, or RUQ abdominal pain.  .  Contractions: Not present. Vag. Bleeding: None.  Movement: Present. Denies leaking of fluid.   The following portions of the patient's history were reviewed and updated as appropriate: allergies, current medications, past family history, past medical history, past social history, past surgical history and problem list.   Objective:   Vitals:   05/14/20 1122 05/14/20 1129  BP: (!) 165/106 (!) 165/119  Pulse: 85 (!) 105  Weight: 248 lb (112.5 kg)     Fetal Status: Fetal Heart Rate (bpm): 154 Fundal Height: 35 cm Movement: Present     General:  Alert, oriented and cooperative. Patient is in no acute distress.  Skin: Skin is warm and dry. No rash noted.   Cardiovascular: Normal heart rate noted  Respiratory: Normal respiratory effort, no problems with respiration noted  Abdomen: Soft, gravid, appropriate for gestational age.  Pain/Pressure: Absent     Pelvic: Cervical exam deferred        Extremities: Normal range of motion.  Edema: Deep pitting, indentation remains for a short time  DTRs: 1+ bilaterally  Mental Status: Normal mood and affect. Normal behavior. Normal judgment and thought content.   Assessment and Plan:  Pregnancy: G2P0010 at [redacted]w[redacted]d 1. Supervision of high risk pregnancy, antepartum  2.  Chronic  Hypertension - Severe - To labor and delivery now.  3.  IUGR - To labor and delivery now.    Preterm labor symptoms and general obstetric precautions including but not limited to vaginal bleeding, contractions, leaking of fluid and fetal movement were reviewed in detail with the patient. Please refer to After Visit Summary for other counseling recommendations.   No follow-ups on file.  Future Appointments  Date Time Provider Halfway  05/23/2020 11:45 AM Desenglau, Tommy Rainwater, PT [facility]  05/30/2020 11:45 AM Desenglau, Tommy Rainwater, PT [facility]  06/03/2020 11:45 AM Desenglau, Tommy Rainwater, PT [facility]    Cephas Darby, MD

## 2020-05-15 ENCOUNTER — Encounter (HOSPITAL_COMMUNITY): Payer: Self-pay | Admitting: Obstetrics & Gynecology

## 2020-05-15 LAB — CBC
HCT: 28.6 % — ABNORMAL LOW (ref 36.0–46.0)
Hemoglobin: 8.9 g/dL — ABNORMAL LOW (ref 12.0–15.0)
MCH: 26.3 pg (ref 26.0–34.0)
MCHC: 31.1 g/dL (ref 30.0–36.0)
MCV: 84.4 fL (ref 80.0–100.0)
Platelets: 314 10*3/uL (ref 150–400)
RBC: 3.39 MIL/uL — ABNORMAL LOW (ref 3.87–5.11)
RDW: 14.9 % (ref 11.5–15.5)
WBC: 14.5 10*3/uL — ABNORMAL HIGH (ref 4.0–10.5)
nRBC: 0 % (ref 0.0–0.2)

## 2020-05-15 LAB — RPR
RPR Ser Ql: REACTIVE — AB
RPR Titer: 1:1 {titer}

## 2020-05-15 MED ORDER — AMLODIPINE BESYLATE 5 MG PO TABS
5.0000 mg | ORAL_TABLET | Freq: Every day | ORAL | Status: DC
  Administered 2020-05-15: 5 mg via ORAL
  Filled 2020-05-15: qty 1

## 2020-05-15 MED ORDER — FERROUS SULFATE 325 (65 FE) MG PO TABS
325.0000 mg | ORAL_TABLET | ORAL | Status: DC
  Administered 2020-05-15 – 2020-05-17 (×2): 325 mg via ORAL
  Filled 2020-05-15 (×2): qty 1

## 2020-05-15 NOTE — Lactation Note (Signed)
This note was copied from a baby's chart. Lactation Consultation Note  Patient Name: Girl Fawne Hughley [id number] Date: 05/15/2020 Reason for consult: Follow-up assessment;Difficult latch;1st time breastfeeding;Early term 37-38.6wks;Infant < 6lbs P1. 25 hour ETI female infant less than 5 lbs at birth, weight loss -1%. LC notice mom has soley been formula feeding for past 16 hours not latching infant at the breast. Mom has large flat nipples with edema, per mom she doesn't have breast milk to given infant. LC discussed colostrum consistency and infant's small tummy size.  LC help with hand expression and mom easily expressed colostrum. Mom fitted with 20 mm NS, pre-filled with 0.5 mls of colostrum, mom latched infant on her left breast using the football hold position with pillow support. Infant sustained latch with depth, swallows heard "cuh sound" and breastfed for 20 minutes. Afterwards dad is supplement with formula will give infant 10-20 mls of Similac Neosure with iron 22 kcal. Mom will use DEBP every 3 hours for 15 minutes. Mom knows to call [facility] services if she need further assistance with latching infant at breast. Family will follow LPTI feeding policy and procedures ( green sheet ) given.   Maternal Data    Feeding Feeding Type: Breast Fed Nipple Type: Extra Slow Flow  LATCH Score Latch: Grasps breast easily, tongue down, lips flanged, rhythmical sucking.  Audible Swallowing: Spontaneous and intermittent  Type of Nipple: Flat  Comfort (Breast/Nipple): Soft / non-tender  Hold (Positioning): Assistance needed to correctly position infant at breast and maintain latch.  LATCH Score: 8  Interventions Interventions: Assisted with latch;Skin to skin;Hand express;Breast compression;Adjust position;Support pillows;Position options;Expressed milk  Lactation Tools Discussed/Used Tools: Pump;Nipple Shields Nipple shield size: 20 Breast pump type: Double-Electric Breast  Pump   Consult Status Consult Status: Follow-up Date: 05/15/20 Follow-up type: In-patient    Vicente Serene 05/15/2020, 7:33 PM

## 2020-05-15 NOTE — Progress Notes (Addendum)
POSTPARTUM PROGRESS NOTE  Post Operative Day 1  Subjective:  Sally Taylor is a 29 y.o. G2P1010 s/p pLTCS for transverse lie/IUGR at [redacted]w[redacted]d.  No acute events overnight.  Pt denies problems with ambulating or po intake. Has not yet urinated but had her foley cath removed ~5:40 this AM. She denies nausea or vomiting.  Pain is well controlled.  She has had flatus. She has not had bowel movement.  Lochia Small with small clots present.   Objective: Blood pressure 122/85, pulse 71, temperature (!) 97.5 F (36.4 C), temperature source Oral, resp. rate 18, SpO2 100 %, unknown if currently breastfeeding.  Physical Exam:  General: alert, cooperative and no distress Chest: no respiratory distress Heart: regular rate, normal heart sounds Abdomen: soft, mild tenderness to palpation in lower abdomen, incision c/d/i  Uterine Fundus: firm, appropriately tender DVT Evaluation: No calf swelling, tenderness, or erythema Extremities: bilateral 1+ pitting edema Skin: warm, dry; incision clean/dry/intact with appropriate post-op pain  Recent Labs    05/14/20 1002 05/15/20 0521  HGB 10.7* 8.9*  HCT 33.6* 28.6*    Assessment/Plan: Sally Taylor is a 29 y.o. G2P1010 s/p LTCS for transverse lie/IUGR at [redacted]w[redacted]d. Pregnancy complicated by chronic hypertension with SIPE. BP elevated to 179/98 yesterday prior to c/s. Better controlled today with most recent at 122/85. Started on Norvasc 5 mg. Denies HA, changes in vision, and RUQ pain. Post-op H&H as above. Will initiate qod ferrous sulfate. Denies dizziness or weakness on standing or with exertion. Overall doing well with no complaints, questions, or concerns.  --POD#1 - Doing well. Afebrile with good pain control on Tylenol. --Contraception: Undecided. Counseled on options, pt is leaning toward nexplanon but prefers more time to think. --Feeding: Breast and bottle --cHTN w/ SIPE: started on Norvasc 5 mg qd, pressures have been  MR. Continue to monitor. --Acute blood loss anemia: Ferrous sulfate 325 mg qod  --Dispo: Plan for discharge ~2-3 days.    LOS: 1 day   Calvert Cantor, Medical Student 05/15/2020, 7:32 AM   I saw and evaluated the patient and repeated the history and physical. I agree with the findings and the plan of care as documented in the medical student's note and have made any necessary edits.  Sharene Skeans, MD [facility] Family Medicine Fellow, [facility] for [facility], [place]

## 2020-05-16 ENCOUNTER — Inpatient Hospital Stay (HOSPITAL_COMMUNITY)
Admission: RE | Admit: 2020-05-16 | Payer: Medicaid Other | Source: Home / Self Care | Admitting: Obstetrics and Gynecology

## 2020-05-16 LAB — T.PALLIDUM AB, TOTAL: T Pallidum Abs: NONREACTIVE

## 2020-05-16 MED ORDER — AMLODIPINE BESYLATE 5 MG PO TABS
10.0000 mg | ORAL_TABLET | Freq: Every day | ORAL | Status: DC
  Administered 2020-05-16 – 2020-05-17 (×2): 10 mg via ORAL
  Filled 2020-05-16 (×3): qty 2

## 2020-05-16 NOTE — Lactation Note (Signed)
This note was copied from a baby's chart. Lactation Consultation Note  Patient Name: Sally Taylor [id number] Date: 05/16/2020 Reason for consult: Follow-up assessment  Follow up to 47 hours LPTI with 3.38% weight loss per mother's request. Mother reports infant able to latch with NS. Mother had some colostrum inside NS via curved tip syringe infant was able to transfer. Infant still showing hunger cues at visit. Mother requested assistance to bottle-feed formula. LC paced bottle fed ~ 62mL of formula, upright position and frequent burping. Mother used DEBP with initiation setting and collected a few drops of colostrum. Encouraged mother to massage breast before pumping. Infant had a void while bottlefeeding and this LC changed diaper. Reinforced supplementation guidelines, stomach size and paced bottle feeding. Encouraged frequent burping and sit up position. Talked a bout pumping whenever infant is supplemented with formula. Encouraged to contact lactation services as needed for any questions or concerns.    Maternal Data Formula Feeding for Exclusion: No Has patient been taught Hand Expression?: Yes Does the patient have breastfeeding experience prior to this delivery?: No  Feeding Feeding Type: Bottle Fed - Formula Nipple Type: Extra Slow Flow  Interventions Interventions: DEBP;Hand express;Breast massage  Lactation Tools Discussed/Used Tools: Pump Nipple shield size: 20 Breast pump type: Double-Electric Breast Pump   Consult Status Consult Status: Follow-up Date: 05/17/20 Follow-up type: In-patient    Sally Taylor A Higuera Ancidey 05/16/2020, 5:33 PM

## 2020-05-16 NOTE — Lactation Note (Signed)
This note was copied from a baby's chart. Lactation Consultation Note  Patient Name: Sally Taylor [id number] Date: 05/16/2020 Reason for consult: Follow-up assessment;Late-preterm 34-36.6wks;Infant < 6lbs;Primapara;1st time breastfeeding  P1 mother whose infant is now 69 hours old.  This is a LPTI at 36+6 weeks with a CGA of 37+1 weeks.  Mother is breast/bottle feeding using Similac 22 calorie formula.  Baby was asleep in mother's arms when I arrived.  Reviewed the LPTI  policy supplementation guidelines and suggested mother supplement 10-20 mls after every feeding today.  By early this evening mother knows to supplement with 30 mls or more after every breast feeding.  I asked mother to call for latch assistance with the next feeding and, at any time, if she has difficulty with getting baby to obtain the recommended supplementation volumes.  Mother verbalized understanding.  Mother will continue to feed at least every three hours due to gestational age and size.  She will awaken baby at the third hour if she does not self awaken.  Mother has been taught hand expression.    Mother does not have a DEBP for home use.  Asked permission to fax a referral for mother and obtained her signature.  Mother appreciative.  Suggested mother follow up with a phone call to the [facility] office.  No support person present at this time.    Maternal Data Formula Feeding for Exclusion: No Has patient been taught Hand Expression?: Yes Does the patient have breastfeeding experience prior to this delivery?: No  Feeding Feeding Type: Breast Fed Nipple Type: Extra Slow Flow  LATCH Score Latch: Grasps breast easily, tongue down, lips flanged, rhythmical sucking.  Audible Swallowing: None  Type of Nipple: Flat  Comfort (Breast/Nipple): Soft / non-tender  Hold (Positioning): Assistance needed to correctly position infant at breast and maintain latch.  LATCH Score: 6  Interventions    Lactation  Tools Discussed/Used Tools: Nipple [name] [name] Program: Yes   Consult Status Consult Status: Follow-up Date: 05/17/20 Follow-up type: In-patient    Sally Taylor [name] 05/16/2020, 11:38 AM

## 2020-05-16 NOTE — Progress Notes (Addendum)
Post Op Day 2  Subjective Up ad lib, voiding, tolerating PO, and + flatus. Denies dizziness, lightheadedness, tachycardia. Appropriate Lochia. Pain well controlled.  Objective Temp:  [97.8 F (36.6 C)-99.2 F (37.3 C)] 98.4 F (36.9 C) (10/28 0537) Pulse Rate:  [80-94] 85 (10/28 0537) Resp:  [16-20] 16 (10/28 0537) BP: (137-154)/(85-91) 139/91 (10/28 0537) SpO2:  [99 %-100 %] 100 % (10/27 2053) Intake/Output      10/27 0701 - 10/28 0700 10/28 0701 - 10/29 0700   I.V.     Total Intake     Urine 600    Blood     Total Output 600    Net -600           General: alert, cooperative, NAD Lochia: appropriate Uterine Fundus: firm Incision: no significant drainage, no dehiscence, no significant erythema, Dressing with blood; however, has not seeped past previous area of demarcation  DVT Evaluation: No evidence of DVT, extremities warm and well perfused.   Recent Labs    05/14/20 1002 05/15/20 0521  HGB 10.7* 8.9*  HCT 33.6* 28.6*   Assessment & Plan  29 y/o G2P0010 POD#2 after pLTCS for transverse lie.   Post Op Day # 2 . Plan for dc tomorrow . Keep incision site clean and dry, OOB  . Pain: scheduled ibuprofen. PRN tylenol & oxycodone . Breastfeeding, continue daily lactation consult . Contraception: declines nexplanon. Will follow up for contraception outpatient     CHTN w/ SIPE: Started norvasc 5 mg QD yesterday. Will increase to 10 mg today. Mild to moderate range pressures.  . Continue to monitor    Anemia 10.7>8.9 (05/15/20).   Continue PO iron    LOS: 2 days   Wilber Oliphant 05/16/2020, 7:27 AM   I saw and evaluated the patient. I agree with the findings and the plan of care as documented in the resident's note.  Sharene Skeans, MD [facility] Family Medicine Fellow, [facility] for [facility], [place]

## 2020-05-16 NOTE — Lactation Note (Signed)
This note was copied from a baby's chart. Lactation Consultation Note Baby 60 hrs old. Mom awake holding baby. Had just given formula. Asked mom how the BF is going mom stated fine. Asked mom if she is pumping mom stated she hadn't pumped since 10 pm because she has been tired and slept well. Fed baby then goes back to sleep.  Asked mom to call for latch for next feeding. Mom stated OK. Mom stated she was about to pump after she goes to [place].  Patient Name: Sally Taylor [id number] Date: 05/16/2020 Reason for consult: Follow-up assessment;Late-preterm 34-36.6wks;Primapara;Infant < 6lbs   Maternal Data    Feeding Feeding Type: Bottle Fed - Formula Nipple Type: Slow - flow  LATCH Score                   Interventions    Lactation Tools Discussed/Used     Consult Status Consult Status: Follow-up Date: 05/16/20 Follow-up type: In-patient    Theodoro Kalata 05/16/2020, 4:51 AM

## 2020-05-17 ENCOUNTER — Other Ambulatory Visit (HOSPITAL_COMMUNITY): Payer: Self-pay | Admitting: Obstetrics and Gynecology

## 2020-05-17 LAB — SURGICAL PATHOLOGY

## 2020-05-17 MED ORDER — AMLODIPINE BESYLATE 10 MG PO TABS
10.0000 mg | ORAL_TABLET | Freq: Every day | ORAL | 0 refills | Status: DC
Start: 2020-05-17 — End: 2020-05-17

## 2020-05-17 MED ORDER — OXYCODONE HCL 5 MG PO TABS
5.0000 mg | ORAL_TABLET | ORAL | 0 refills | Status: DC | PRN
Start: 2020-05-17 — End: 2020-05-17

## 2020-05-17 MED ORDER — FERROUS SULFATE 325 (65 FE) MG PO TABS
325.0000 mg | ORAL_TABLET | ORAL | 1 refills | Status: DC
Start: 2020-05-17 — End: 2020-05-17

## 2020-05-17 MED ORDER — IBUPROFEN 800 MG PO TABS
800.0000 mg | ORAL_TABLET | Freq: Four times a day (QID) | ORAL | 0 refills | Status: DC
Start: 2020-05-17 — End: 2020-05-17

## 2020-05-17 MED FILL — AMLODIPINE BESYLATE 10 MG T: 10 | 45 days supply | Qty: 45 | Fill #0

## 2020-05-17 MED FILL — oxyCODONE HCL 5 MG TABS: 5 | 2 days supply | Qty: 20 | Fill #0

## 2020-05-17 MED FILL — IBUPROFEN 800 MG TAB: 800 | 5 days supply | Qty: 30 | Fill #0

## 2020-05-17 MED FILL — FERROUS SULFATE 325 MG TAB: 325 (65 FE) | 30 days supply | Qty: 15 | Fill #0

## 2020-05-17 NOTE — Progress Notes (Addendum)
LTCS honeycomb dressing is changed with aseptic technique. No abnormal signs and symptoms. Pt tolerated procedure well.

## 2020-05-17 NOTE — Progress Notes (Deleted)
POSTPARTUM PROGRESS NOTE  Post Operative Day 3  Subjective:  Sally Taylor is a 29 y.o. G2P1010 s/p LTCS for IUGR at [redacted]w[redacted]d.  No acute events overnight.  Pt denies problems with ambulating, voiding or po intake.  She denies nausea or vomiting.  Pain is well controlled.  She has had flatus. She has not had bowel movement since her c-section. Drinking prune juice cocktail at time of exam. Lochia - Small clots. Pt reports that she started to develop a minor right sided throbbing HA this morning around 5:30. Reports that this feels similar to HAs she has had in the past and believes it is due to getting minimal sleep. Denies changes in vision and RUQ pain. Received ibuprofen and oxycodone shortly before exam.  Objective: Blood pressure 138/85, pulse 79, temperature (!) 97.3 F (36.3 C), temperature source Oral, resp. rate 18, SpO2 100 %, unknown if currently breastfeeding.  Physical Exam:  General: alert, cooperative and no distress Chest: no respiratory distress Heart:regular rate Abdomen: soft, nontender, +bowel sounds Uterine Fundus: firm, appropriately tender DVT Evaluation: No calf swelling or tenderness Extremities: bilateral 1+ pitting edema Skin: warm, dry  Recent Labs    05/14/20 1002 05/15/20 0521  HGB 10.7* 8.9*  HCT 33.6* 28.6*    Assessment/Plan: Sally Taylor is a 29 y.o. G2P1010 s/p LTCS for IUGR at [redacted]w[redacted]d. Pt is doing well  POD# 3 - Doing well. Afebrile with good pain control on ibuprofen and oxycodone. Contraception: Plans to determine this at her post partum visit. Feeding: Both. Breastfeeding well. Dispo: Plan for discharge today. Headache: Similar to prior headaches. Likely fatigue/poor sleep contributing. Will assess for improvement following ibuprofen and oxycodone this AM. cHTN w/ SIPE: BP improved after increase in Norvasc to 10 mg yesterday. However, still elevated at 138/85 but this appears to be close to her baseline. Will  continue Norvasc 10 mg encourage close post partum f/u at 1 week. Anemia: Ferrous sulfate 325mg  qod Constipation: bowel sounds present - encourage water intake, prune juice, senokot prn  Reassessment: (07:41) -Pt reports that her headache is improving. It is no longer throbbing and is less painful.   LOS: 3 days   Calvert Cantor, Medical Student 05/17/2020, 6:43 AM

## 2020-05-17 NOTE — Lactation Note (Addendum)
This note was copied from a baby's chart. Lactation Consultation Note  Patient Name: Sally Taylor Date: 05/17/2020 Reason for consult: Follow-up assessment Baby Sally Owens Shark being d/c today at 57 hours of age.  Mom reports she does not have a DEBP for home use. Mom reports she talked to someone from [facility] and they told her she could not have a breastpump and formula.  Discussed with Junie Panning, Breastfeeding coordinator at [facility].  Junie Panning reports mom can get a breastpump and a partial formula package until her milk starts to come to volume. Discussed [employer] loaner breastpump as well.  Urged mom to let us know if she would like to do that.  Mom reports she isn't really latching well.  Mostly bottle feeding at this time.   Mom reports she wants to take a nap.  Mom has lots of skin inside her flanges.  Discussed pumping so LC can observe and then napping.  Explained oxytocin may help her sleep.   Stayed there with mom duration of pumping.  Switched mom to 27 mm flanges.  Mom reports more comfort and milk starts to flow better.  Mom pumped for 15 minutes.  Breast still have some lumpy hardened full areas.  Discussed pumping up to 30 minutes to soften breasts.  Discussed engorgement preventention.  Urged mom to pump 8-12 times day past breastfeeding.  Mom has a manual pump for now for home use.  Demo how to use it.  Mom reports doesn't suction well.   Mom has [facility] breastfeeding resource list for home use.  Urged mom to call lactation as needed.  Maternal Data    Feeding Feeding Type: Bottle Fed - Breast Milk  LATCH Score                   Interventions Interventions: Breast massage;Reverse pressure;Hand pump;DEBP  Lactation Tools Discussed/Used     Consult Status Consult Status: Complete Date: 05/17/20 Follow-up type: In-patient    Sally Taylor 05/17/2020, 2:11 PM

## 2020-05-21 ENCOUNTER — Ambulatory Visit: Payer: Medicaid Other

## 2020-05-21 DIAGNOSIS — Z0130 Encounter for examination of blood pressure without abnormal findings: Secondary | ICD-10-CM

## 2020-05-21 DIAGNOSIS — Z4889 Encounter for other specified surgical aftercare: Secondary | ICD-10-CM

## 2020-05-22 ENCOUNTER — Other Ambulatory Visit: Payer: Self-pay

## 2020-05-22 ENCOUNTER — Ambulatory Visit (INDEPENDENT_AMBULATORY_CARE_PROVIDER_SITE_OTHER): Payer: Medicaid Other

## 2020-05-22 VITALS — BP 136/93 | HR 85 | Wt 224.8 lb

## 2020-05-22 DIAGNOSIS — I10 Essential (primary) hypertension: Secondary | ICD-10-CM

## 2020-05-22 DIAGNOSIS — Z4889 Encounter for other specified surgical aftercare: Secondary | ICD-10-CM

## 2020-05-22 NOTE — Progress Notes (Signed)
Agree with A & P. 

## 2020-05-22 NOTE — Progress Notes (Signed)
Subjective:  Sally Taylor is a 29 y.o. female here for BP check and wound check s/p TV-L c/s . Pt did not take BP meds prior to appt because she takes it at 10 am everyday.   Hypertension ROS: taking medications as instructed, no medication side effects noted, no TIA's, no chest pain on exertion, no dyspnea on exertion and no swelling of ankles.    Objective:  LMP - recent C/S 05/17/20 Appearance alert, well appearing, and in no distress. General exam BP noted to be stable today in office.    Assessment:   Blood Pressure stable.   Plan:  Continue BP meds daily as directed. Keep upcoming routine pp visit.   The wound is cleansed, debrided of foreign material as much as possible.The patient is alerted to watch for any signs of infection (redness, pus, pain, increased swelling or fever) and call if such occurs. Home wound care instructions are provided.

## 2020-05-23 ENCOUNTER — Ambulatory Visit: Payer: Medicaid Other | Admitting: Physical Therapy

## 2020-05-30 ENCOUNTER — Ambulatory Visit: Payer: Medicaid Other | Admitting: Physical Therapy

## 2020-06-03 ENCOUNTER — Ambulatory Visit (INDEPENDENT_AMBULATORY_CARE_PROVIDER_SITE_OTHER): Payer: Managed Care, Other (non HMO) | Admitting: Obstetrics and Gynecology

## 2020-06-03 ENCOUNTER — Encounter: Payer: Medicaid Other | Admitting: Physical Therapy

## 2020-06-03 ENCOUNTER — Telehealth: Payer: Self-pay | Admitting: Licensed Clinical Social Worker

## 2020-06-03 ENCOUNTER — Other Ambulatory Visit: Payer: Self-pay

## 2020-06-03 ENCOUNTER — Encounter: Payer: Self-pay | Admitting: Obstetrics and Gynecology

## 2020-06-03 DIAGNOSIS — Z0131 Encounter for examination of blood pressure with abnormal findings: Secondary | ICD-10-CM

## 2020-06-03 DIAGNOSIS — O99893 Other specified diseases and conditions complicating puerperium: Secondary | ICD-10-CM

## 2020-06-03 DIAGNOSIS — Z98891 History of uterine scar from previous surgery: Secondary | ICD-10-CM | POA: Diagnosis not present

## 2020-06-03 DIAGNOSIS — R519 Headache, unspecified: Secondary | ICD-10-CM | POA: Diagnosis not present

## 2020-06-03 NOTE — Telephone Encounter (Signed)
Pt called in reports headaches for 3-4 days. Appt made for today 3:45pm with Dr. Elly Modena. Pt transferred to triage to discuss blood pressure concerns.

## 2020-06-03 NOTE — Progress Notes (Signed)
29 yo P1 s/p PLTCS on 62/86 complicated by [facility] with superimposed preeclampsia. Patient reports persistent headaches since delivery. She does not like taking medications and thus had not taken anything until today. She took ibuprofen and had a nap prior to her appointment. She reports complete resolution of her headache. Patient denies any other symptoms such as nausea, emesis or visual changes.  Past Medical History:  Diagnosis Date  . Asthma   . Headache   . Hypertension   . Pelvic pain affecting pregnancy in second trimester, antepartum 02/07/2020   Past Surgical History:  Procedure Laterality Date  . CESAREAN SECTION N/A 05/14/2020   Procedure: CESAREAN SECTION;  Surgeon: Cherre Blanc, MD;  Location: [facility] LD ORS;  Service: Obstetrics;  Laterality: N/A;  . LAPAROSCOPIC APPENDECTOMY N/A 04/01/2017   Procedure: APPENDECTOMY LAPAROSCOPIC;  Surgeon: Johnathan Hausen, MD;  Location: [facility] ORS;  Service: General;  Laterality: N/A;   Blood pressure 128/80, pulse 80, weight 228 lb (103.4 kg), currently breastfeeding.  GENERAL: Well-developed, well-nourished female in no acute distress.  ABDOMEN: Soft, nontender, nondistended. No organomegaly. Incision healing well EXTREMITIES: No cyanosis, clubbing, or edema, 2+ distal pulses.  A/P 29 yo 2 weeks postpartum with headaches - Reassurance provided - Encouraged patient to stay well hydrated and to take tylenol as needed for headaches - Precautions reviewed and reasons to return to office or MAU discussed - Continue Norvasc 10 - Follow up as scheduled for postpartum appointment

## 2020-06-03 NOTE — Progress Notes (Signed)
2 Weeks PP presents for BP check, headaches 5/10 x 2 weeks.

## 2020-06-17 ENCOUNTER — Ambulatory Visit (INDEPENDENT_AMBULATORY_CARE_PROVIDER_SITE_OTHER): Payer: Managed Care, Other (non HMO) | Admitting: Obstetrics and Gynecology

## 2020-06-17 ENCOUNTER — Encounter: Payer: Self-pay | Admitting: Obstetrics and Gynecology

## 2020-06-17 ENCOUNTER — Other Ambulatory Visit: Payer: Self-pay

## 2020-06-17 VITALS — BP 141/92 | HR 79 | Ht 62.0 in | Wt 224.0 lb

## 2020-06-17 DIAGNOSIS — Z3009 Encounter for other general counseling and advice on contraception: Secondary | ICD-10-CM

## 2020-06-17 DIAGNOSIS — O1093 Unspecified pre-existing hypertension complicating the puerperium: Secondary | ICD-10-CM

## 2020-06-17 DIAGNOSIS — O1410 Severe pre-eclampsia, unspecified trimester: Secondary | ICD-10-CM

## 2020-06-17 DIAGNOSIS — Z9889 Other specified postprocedural states: Secondary | ICD-10-CM | POA: Diagnosis not present

## 2020-06-17 DIAGNOSIS — I10 Essential (primary) hypertension: Secondary | ICD-10-CM

## 2020-06-17 DIAGNOSIS — O365930 Maternal care for other known or suspected poor fetal growth, third trimester, not applicable or unspecified: Secondary | ICD-10-CM

## 2020-06-17 NOTE — Progress Notes (Signed)
Obstetrics/Postpartum Visit  Appointment Date: 06/17/2020  OBGYN Clinic: [facility]  Primary Care Provider: Patient, No Pcp Per  Chief Complaint:  Chief Complaint  Patient presents with  . Postpartum Care    History of Present Illness: Sally Taylor is a 29 y.o. African-American [id number] (No LMP recorded.), seen for the above chief complaint. Her past medical history is significant for chronic hypertension.   She is s/p primary low transverse c-section on 05/14/20 at [redacted]w[redacted]d; she was discharged to home on POD#3. Pregnancy complicated by chronic hypertension and IUGR. Noted to have superimposed severe pre-eclampsia, thus delivered. CS for transverse fetal position.  Complains of irregular staining. Has not had a real period.   Vaginal bleeding or discharge: No  Breast or formula feeding: bottle Intercourse: Yes  Contraception: desires nexplanon PP depression s/s: No  Any bowel or bladder issues: No  Pap smear: no abnormalities (date: 08/2018)  Review of Systems: Positive for n/a.   Her 12 point review of systems is negative or as noted in the History of Present Illness.  Patient Active Problem List   Diagnosis Date Noted  . Chronic hypertension with superimposed preeclampsia 05/14/2020  . Cesarean delivery delivered 05/14/2020  . 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 Chronic hypertension 02/14/2020  . Short cervix during pregnancy in third trimester 02/12/2020  . IUGR (intrauterine growth restriction) affecting care of mother 02/06/2020  . 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 History of ectopic pregnancy 08/28/2019    Medications [name] had no medications administered during this visit. Current Outpatient Medications  Medication Sig Dispense Refill  . amLODipine (NORVASC) 10 MG tablet Take 1 tablet (10 mg total) by mouth daily. 45 tablet 0  . ferrous sulfate 325 (65 FE) MG tablet Take 1 tablet (325 mg total) by mouth every other day. 15 tablet  1  . ibuprofen (ADVIL) 800 MG tablet Take 1 tablet (800 mg total) by mouth every 6 (six) hours. 30 tablet 0  . acetaminophen (TYLENOL) 325 MG tablet Take 3 tablets (975 mg total) by mouth every 6 (six) hours as needed for mild pain, moderate pain or headache. (Patient not taking: Reported on 05/22/2020)    . oxyCODONE (OXY IR/ROXICODONE) 5 MG immediate release tablet Take 1-2 tablets (5-10 mg total) by mouth every 4 (four) hours as needed for moderate pain. (Patient not taking: Reported on 05/22/2020) 20 tablet 0  . Prenatal Vit-Fe Fumarate-FA (PREPLUS) 27-1 MG TABS Take 1 tablet by mouth daily. (Patient not taking: Reported on 05/22/2020) 30 tablet 13   No current facility-administered medications for this visit.   Allergies Patient has no known allergies.  Physical Exam:  BP (!) 141/92   Pulse 79   Ht 5\' 2"  (1.575 m)   Wt 224 lb (101.6 kg)   BMI 40.97 kg/m  Body mass index is 40.97 kg/m. General appearance: Well nourished, well developed female in no acute distress.  Cardiovascular: regular rate and rhythm Respiratory:   Normal respiratory effort Abdomen: diffusely non tender to palpation, non distended, well healed pfannenstiel incision Breasts: not examined. Neuro/Psych:  Normal mood and affect.  Skin:  Warm and dry.    PP Depression Screening:    Edinburgh Postnatal Depression Scale - 06/17/20 1315      Edinburgh Postnatal Depression Scale:  In the Past 7 Days   I have been able to laugh and see the funny side of things. 0    I have looked forward with enjoyment to things. 0  I have blamed myself unnecessarily when things went wrong. 0    I have been anxious or worried for no good reason. 0    I have felt scared or panicky for no good reason. 0    Things have been getting on top of me. 0    I have been so unhappy that I have had difficulty sleeping. 0    I have felt sad or miserable. 0    I have been so unhappy that I have been crying. 0    The thought of harming myself  has occurred to me. 0    Edinburgh Postnatal Depression Scale Total 0           Assessment: Patient is a 29 y.o. [id number] who is 4 weeks post partum from a primary c-section. She is doing well.   Plan:   1. Postoperative state Doing well  2. Postpartum state  3. Chronic hypertension Cont norvasc, pt forgot today Emphasized importance of taking meds Referral to primary care for ongoing management  4. Cesarean delivery delivered  5. Poor fetal growth affecting management of mother in third trimester, single or unspecified fetus  6. Severe pre-eclampsia, antepartum Doing well  7. Encounter for counseling regarding contraception Desires nexplanon, had unprotected sex 4 days ago Return 2 weeks for placement   Essential components of care per ACOG recommendations:  1.  Mood and well being: Patient with negative depression screening today. Reviewed local resources for support.  - Patient does use tobacco. If using tobacco we discussed reduction and for recently cessation risk of relapse - hx of drug use? No    2. Infant care and feeding:  -Patient currently breastmilk feeding? No  -Social determinants of health (SDOH) reviewed in EPIC. No concerns  3. Sexuality, contraception and birth spacing - Patient does not want a pregnancy in the next year.  Desired family size is 2 children.  - Reviewed forms of contraception in tiered fashion. Patient desired Nexplanon today.   - Discussed birth spacing of 18 months  4. Sleep and fatigue -Encouraged family/partner/community support of 4 hrs of uninterrupted sleep to help with mood and fatigue  5. Physical Recovery  - Discussed patients delivery and complication - Patient has urinary incontinence? No  - Patient is safe to resume physical and sexual activity  6.  Health Maintenance - Last pap smear done 08/2018 and was normal with negative HPV.  7. Chronic hypertension - PCP follow up   RTC 2 weeks for Nexplanon  placement   K. Arvilla Meres, M.D. Attending Center for [employer] manager)

## 2020-07-01 ENCOUNTER — Other Ambulatory Visit: Payer: Self-pay

## 2020-07-01 ENCOUNTER — Encounter: Payer: Self-pay | Admitting: Advanced Practice Midwife

## 2020-07-01 ENCOUNTER — Ambulatory Visit (INDEPENDENT_AMBULATORY_CARE_PROVIDER_SITE_OTHER): Payer: Managed Care, Other (non HMO) | Admitting: Advanced Practice Midwife

## 2020-07-01 VITALS — BP 132/88 | HR 84 | Wt 224.0 lb

## 2020-07-01 DIAGNOSIS — Z30017 Encounter for initial prescription of implantable subdermal contraceptive: Secondary | ICD-10-CM | POA: Diagnosis not present

## 2020-07-01 MED ORDER — ETONOGESTREL 68 MG ~~LOC~~ IMPL
68.0000 mg | DRUG_IMPLANT | Freq: Once | SUBCUTANEOUS | Status: AC
  Administered 2020-07-01: 14:00:00 68 mg via SUBCUTANEOUS

## 2020-07-01 NOTE — Patient Instructions (Signed)
Nexplanon Instructions After Insertion  Keep bandage clean and dry for 24 hours  May use ice/Tylenol/Ibuprofen for soreness or pain  If you develop fever, drainage or increased warmth from incision site-contact office immediately   

## 2020-07-01 NOTE — Progress Notes (Signed)
  GYNECOLOGY PROGRESS NOTE  History:  29 y.o. G2P0111 presents to [facility] office today for contraceptive visit. She desires Nexplanon for contraception. She does report unprotected intercourse last night.  She denies h/a, dizziness, shortness of breath, n/v, or fever/chills.    The following portions of the patient's history were reviewed and updated as appropriate: allergies, current medications, past family history, past medical history, past social history, past surgical history and problem list. Last pap smear on 08/22/2018 was normal.  Review of Systems:  Pertinent items are noted in HPI.   Objective:  Physical Exam Blood pressure 132/88, pulse 84, weight 224 lb (101.6 kg), last menstrual period 06/20/2020, not currently breastfeeding. VS reviewed, nursing note reviewed,  Constitutional: well developed, well nourished, no distress HEENT: normocephalic CV: normal rate Pulm/chest wall: normal effort Breast Exam: deferred Abdomen: soft Neuro: alert and oriented x 3 Skin: warm, dry Psych: affect normal Pelvic exam: Deferred   Nexplanon Insertion Procedure Patient identified, informed consent performed, consent signed.   Patient does understand that irregular bleeding is a very common side effect of this medication. She was advised to have backup contraception for one week after placement. Pregnancy test in clinic today was negative.  Appropriate time out taken.  Patient's left arm was prepped and draped in the usual sterile fashion.. The ruler used to measure and mark insertion area.  Patient was prepped with alcohol swab and then injected with 3 ml of 1% lidocaine.  She was prepped with betadine, Nexplanon removed from packaging,  Device confirmed in needle, then inserted full length of needle and withdrawn per handbook instructions. Nexplanon was able to palpated in the patient's arm; patient palpated the insert herself. There was minimal blood loss.  Patient insertion site covered  with guaze and a pressure bandage to reduce any bruising.  The patient tolerated the procedure well and was given post procedure instructions.      Assessment & Plan:  1. Nexplanon insertion --Pt with negative UPT in office today. She did have unprotected intercourse x 1 yesterday. Discussed low risks of Nexplanon placement in early pregnancy if pt conceived yesterday. Pt elected to proceed with Nexplanon insertion given low risks and will take UPT at home in 2 weeks.   --Nexplanon placed without difficulty. See procedure note.  - POCT urine pregnancy - etonogestrel (NEXPLANON) implant 68 mg   Sally Taylor, CNM 2:07 PM

## 2020-07-01 NOTE — Progress Notes (Signed)
Patient presents for Nexplanon Insertion.  PP visit on 06/17/20.  Pt had unprotected Intercourse last night.

## 2020-07-26 IMAGING — US US MFM OB LIMITED
1 series · 14 of 28 positions shown · non-contrast
Comparison: none

[Series 1: us mfm ob limited · 32 acquisitions, 14 frames shown]
[im 2/32]
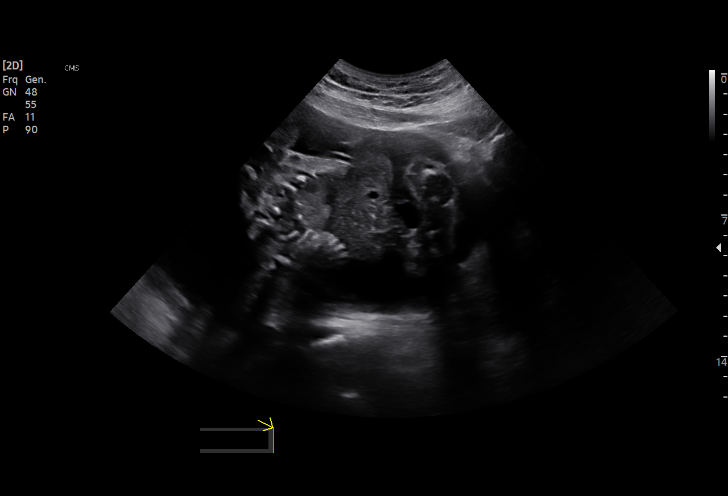
[im 4/32]
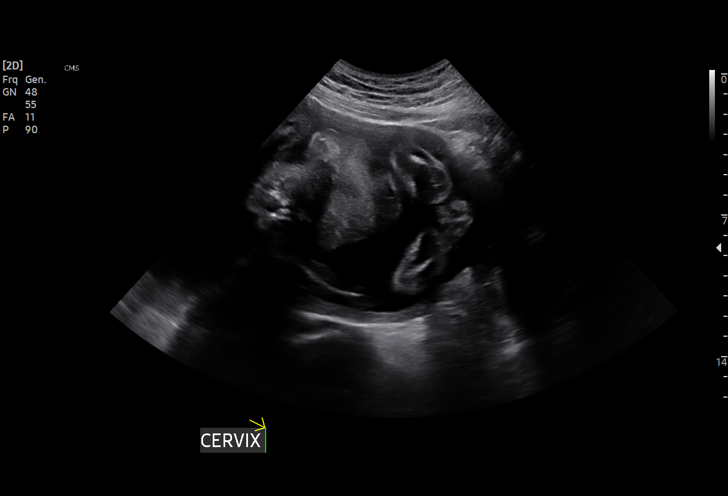
[im 6/32]
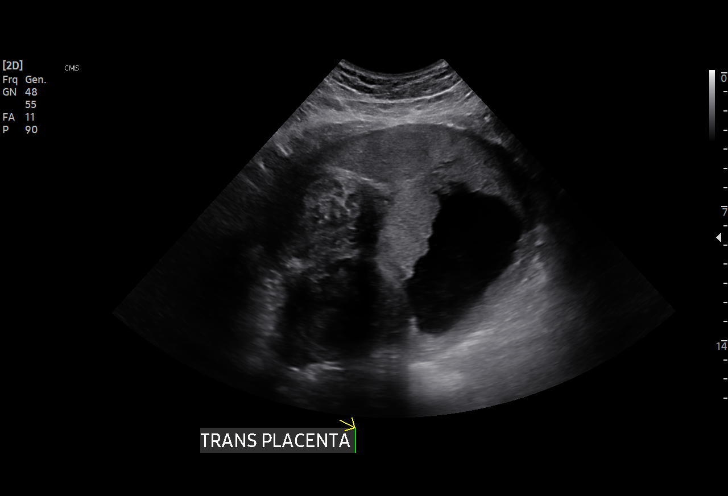
[im 9/32]
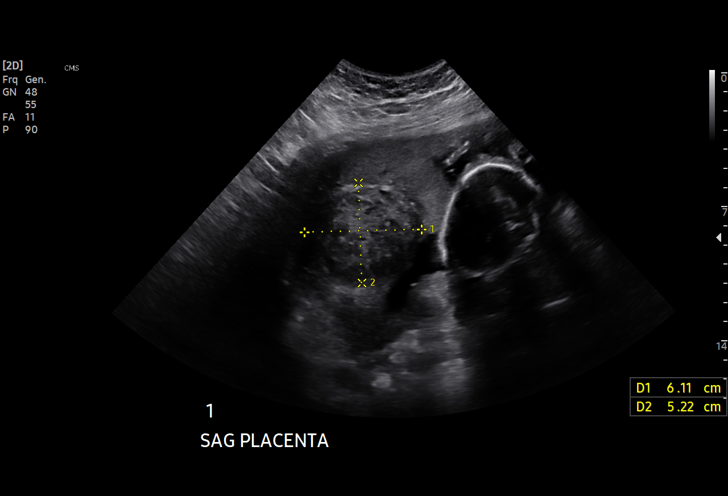
[im 11/32]
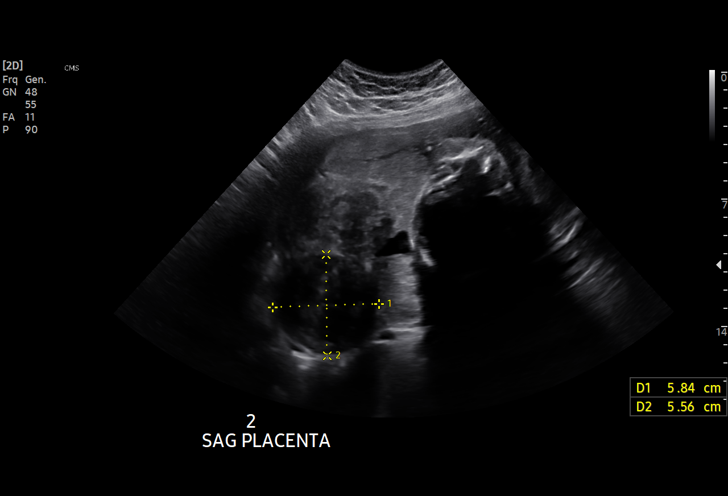
[im 13/32]
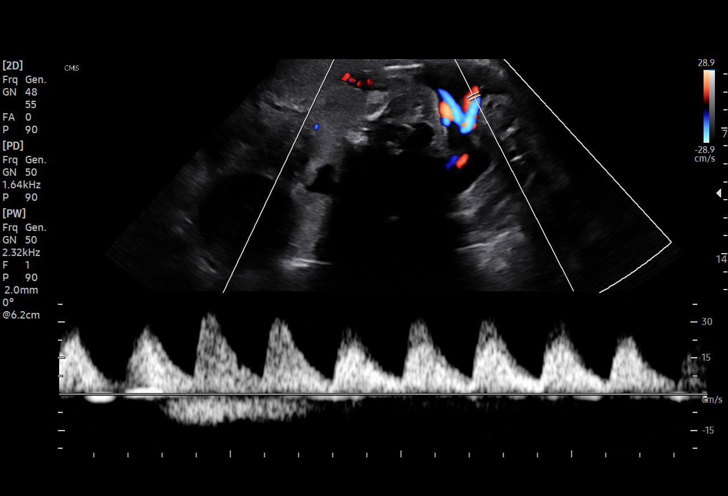
[im 15/32]
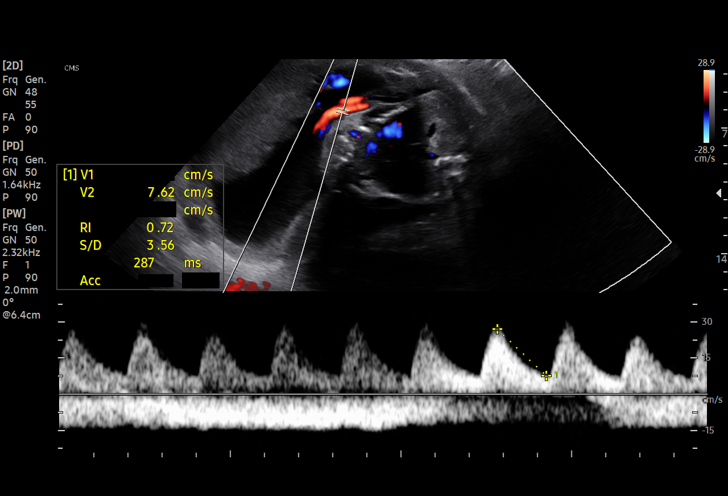
[im 18/32]
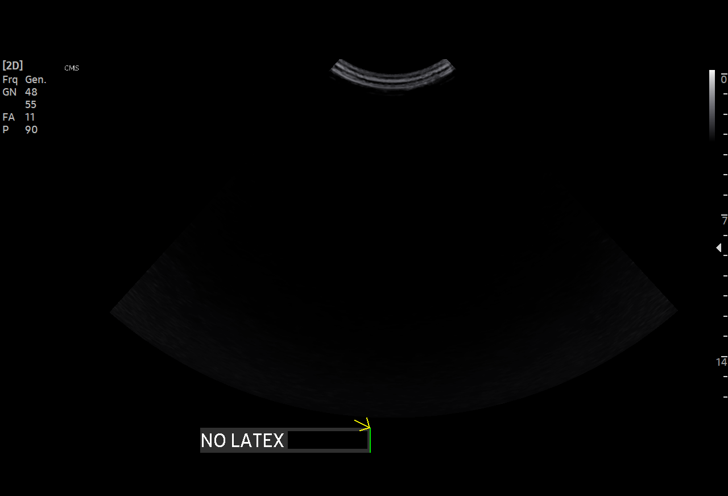
[im 20/32]
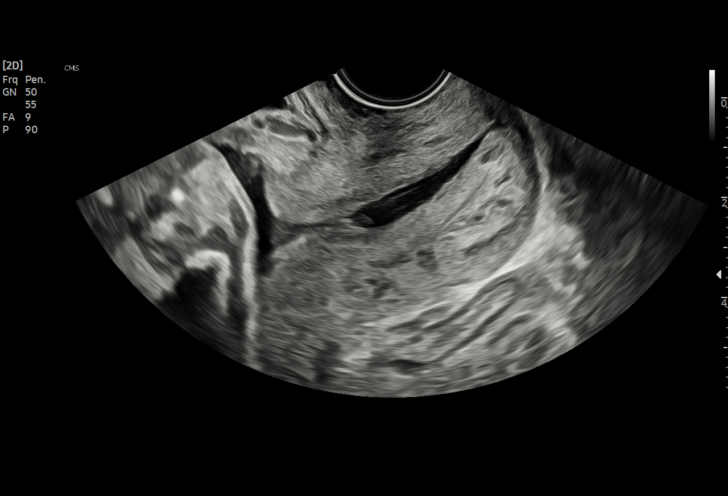
[im 22/32]
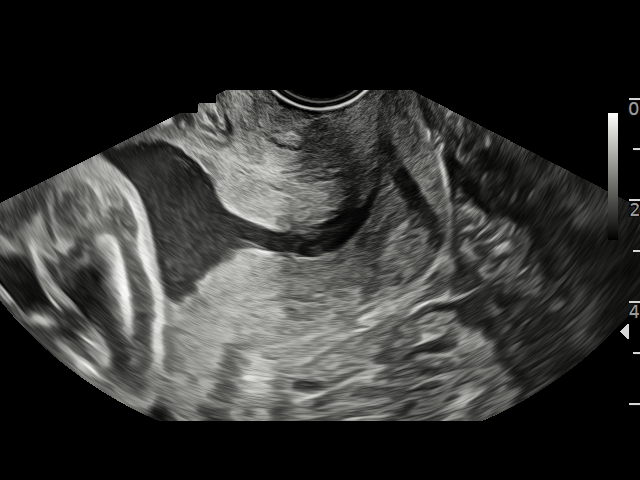
[im 25/32]
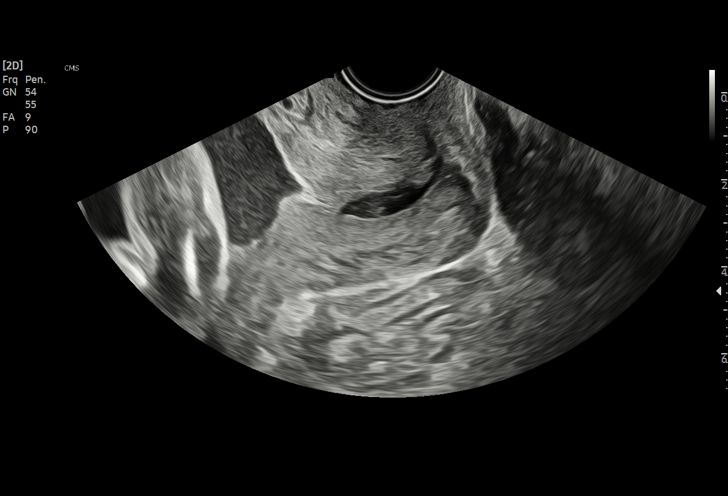
[im 27/32]
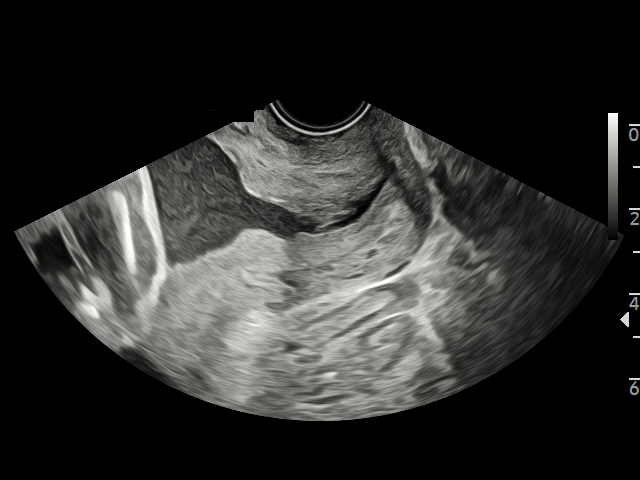
[im 29/32]
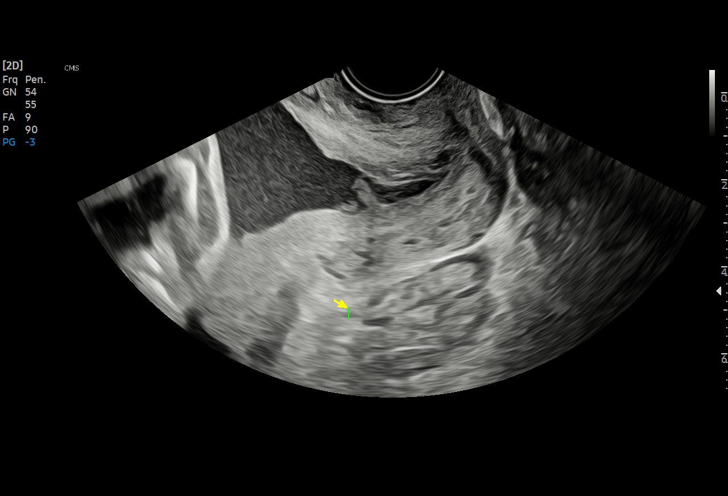
[im 32/32]
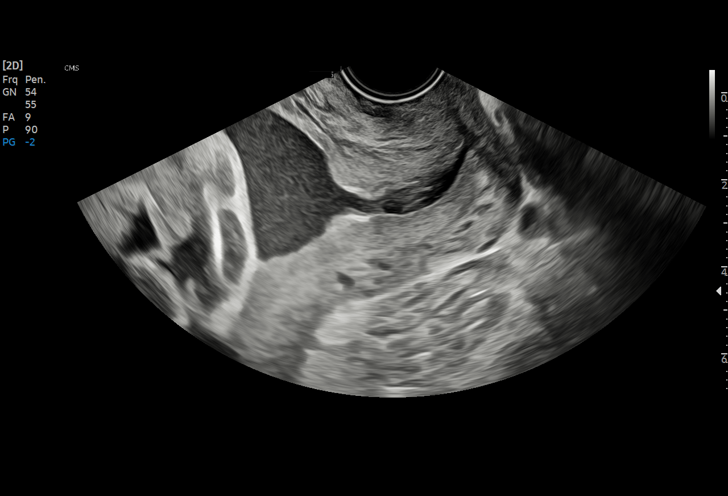

[14 of 28 positions shown; findings below may reference images not displayed]

[place]


 1  US MFM UA CORD DOPPLER                76820.02    [facility]
                                                      [place]
 2  US MFM OB TRANSVAGINAL                76817.2     [facility]OM[facility]
                                                      [place]
 3  US MFM OB LIMITED                     76815.01    [facility] GOM[facility]
                                                      [place]

Indications

 Encounter for other antenatal screening
 follow-up  (low risk NIPS)
 Cervical shortening, second trimester
 Maternal care for known or suspected poor
 fetal growth, second trimester, not applicable
 or unspecified IUGR
 Uterine fibroids affecting pregnancy in        O34.12,
 second trimester, antepartum
 Abnormal finding on antenatal screening
 (elevated AFP 2.84)
 Genetic carrier (SMA)
 23 weeks gestation of pregnancy
Fetal Evaluation

 Num Of Fetuses:         1
 Fetal Heart Rate(bpm):  166
 Cardiac Activity:       Observed
 Presentation:           Breech
 Amniotic Fluid
 AFI FV:      Within normal limits

                             Largest Pocket(cm)

OB History

 Gravidity:    2         Term:   0        Prem:   0        SAB:   1
 TOP:          0       Ectopic:  0        Living: 0
Gestational Age

 Best:          23w 5d     Det. By:  Early Ultrasound         EDD:   06/05/20
                                     (11/14/19)
Doppler - Fetal Vessels

 Umbilical Artery
  S/D     %tile                                              ADFV    RDFV
     4       71                                                 No      No

Cervix Uterus Adnexa

 Cervix
 Length:            0.7  cm.
 Measured transvaginally. Funneling of internal os noted.
Impression

 Patient returns for cervical length measurement and umbilical
 artery Doppler studies.  On ultrasound performed last week,
 the cervix measured 2.3 cm long.  Patient was counseled and
 progesterone was not prescribed as the cervical length was
 greater than 2 cm.  The estimated fetal weight was at the 9th
 percentile.

 On ultrasound, amniotic fluid is normal and good fetal activity
 seen.  Breech presentation.  Umbilical artery Doppler showed
 normal forward diastolic flow.  We performed a transvaginal
 ultrasound to evaluate the cervix.  On transfundal pressure,
 large funneling was seen in the residual closed portion of the
 cervix measured 7 mm.  After explaining, I performed a sterile
 speculum examination.  The external os is closed.  On digital
 examination, the cervix was 2 cm long and the external os
 was closed.

 I counseled the patient on the findings with the help of
 ultrasound images and diagrams, and explained that cervical
 incompetence is associated with increased risk for preterm
 delivery.  I discussed the benefit of antenatal corticosteroids
 now.  I also recommended vaginal progesterone daily starting
 today till 36 weeks gestation.

 I recommended admission for antenatal corticosteroids.  In
 the absence of symptoms, sterile speculum examination will
 be performed after 48 hours and if the external os remains
 closed, outpatient management can be considered.
 Patient agreed with my recommendations.  I discussed the
 findings and recommendations with Dr. Ebadat (attending
 OB).  Patient will be going over to OB hospital, [REDACTED].
Recommendations

 -Fetal growth assessment and umbilical artery Doppler
 studies in 2 weeks.
                 Hossen, Yanchen

## 2020-08-06 ENCOUNTER — Encounter: Payer: Self-pay | Admitting: *Deleted

## 2020-08-12 ENCOUNTER — Other Ambulatory Visit: Payer: Managed Care, Other (non HMO)

## 2020-08-12 DIAGNOSIS — Z20822 Contact with and (suspected) exposure to covid-19: Secondary | ICD-10-CM

## 2020-08-13 ENCOUNTER — Other Ambulatory Visit: Payer: Managed Care, Other (non HMO)

## 2020-08-14 LAB — NOVEL CORONAVIRUS, NAA

## 2020-08-16 ENCOUNTER — Other Ambulatory Visit: Payer: Managed Care, Other (non HMO)

## 2020-08-16 DIAGNOSIS — Z20822 Contact with and (suspected) exposure to covid-19: Secondary | ICD-10-CM

## 2020-08-18 LAB — SARS-COV-2, NAA 2 DAY TAT

## 2020-08-18 LAB — NOVEL CORONAVIRUS, NAA: SARS-CoV-2, NAA: NOT DETECTED

## 2020-08-23 IMAGING — US US MFM OB LIMITED
1 series · 15 of 28 positions shown · non-contrast
Comparison: none

[Series 1: us mfm ob limited · 28 acquisitions, 15 frames shown]
[im 1/28]
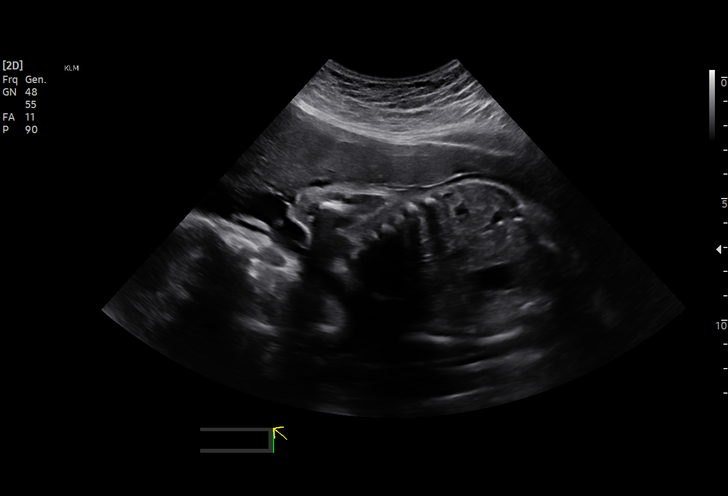
[im 3/28]
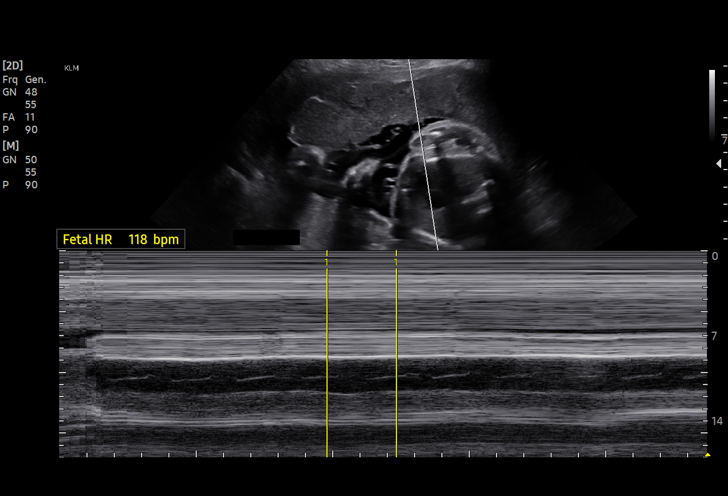
[im 5/28]
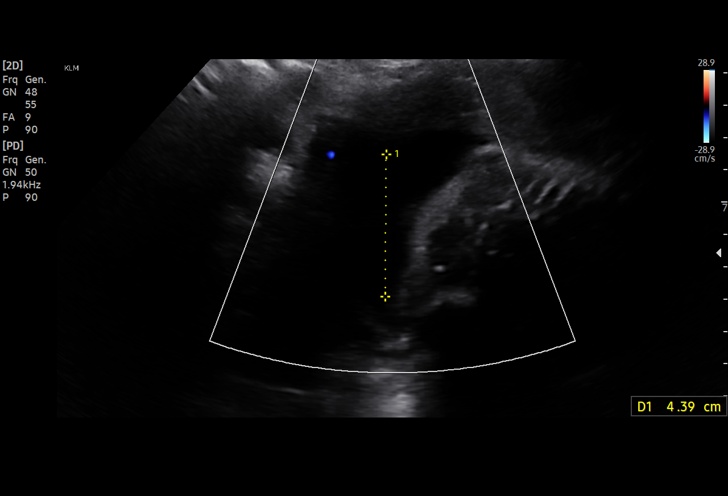
[im 7/28]
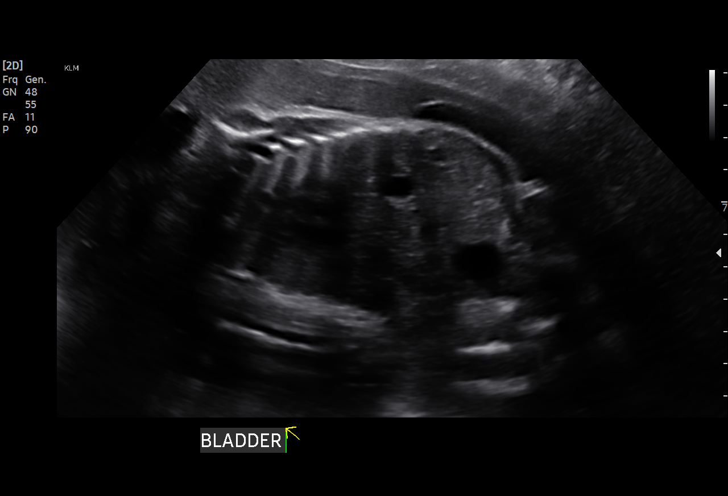
[im 9/28]
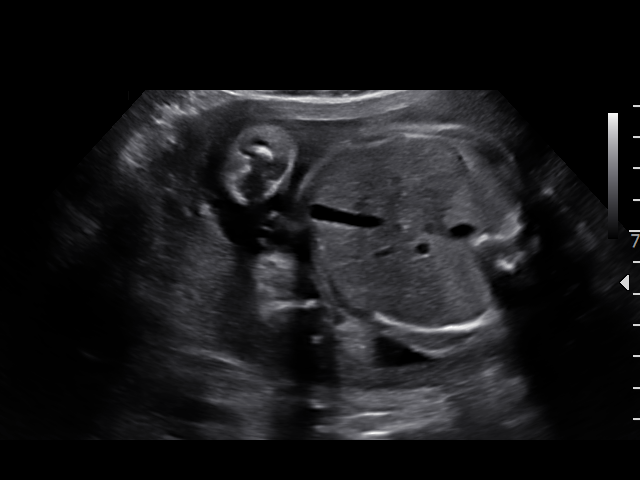
[im 11/28]
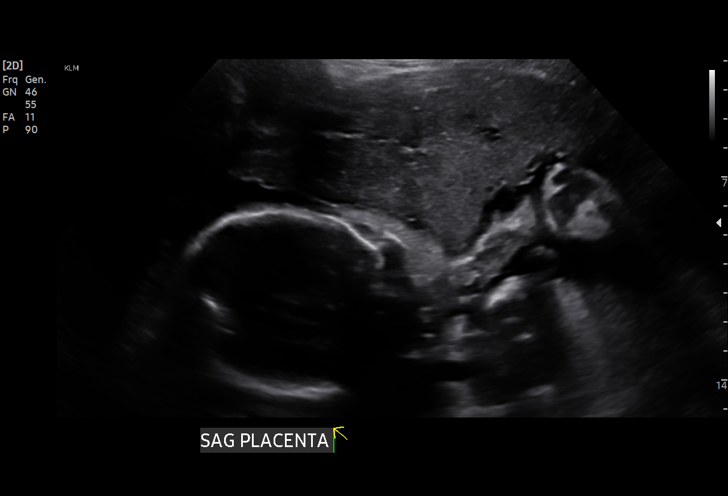
[im 13/28]
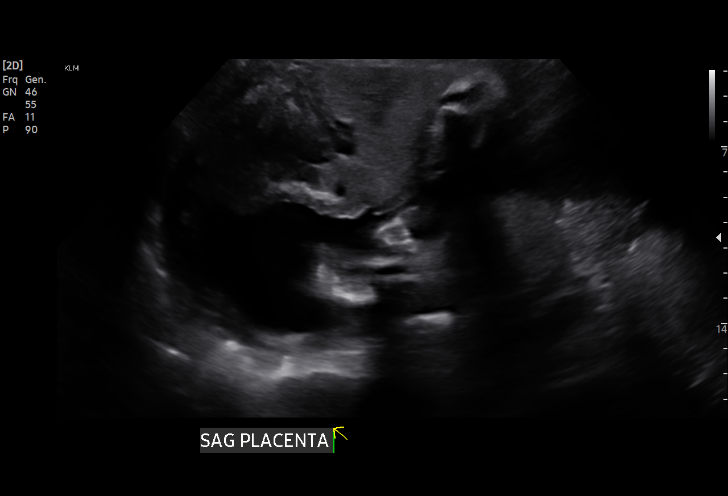
[im 15/28]
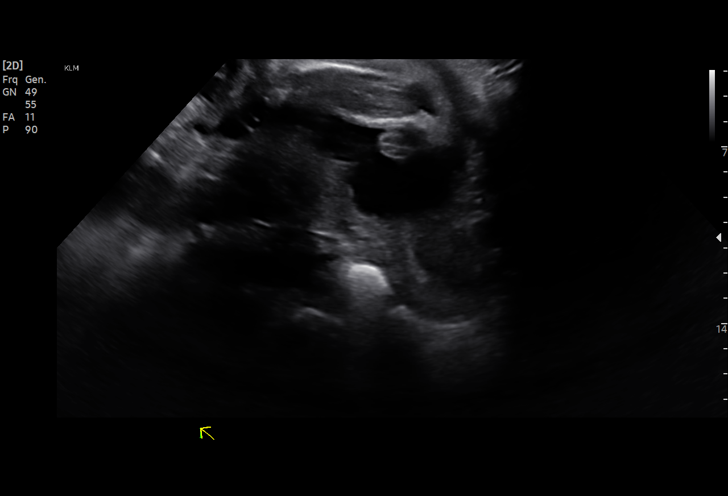
[im 16/28]
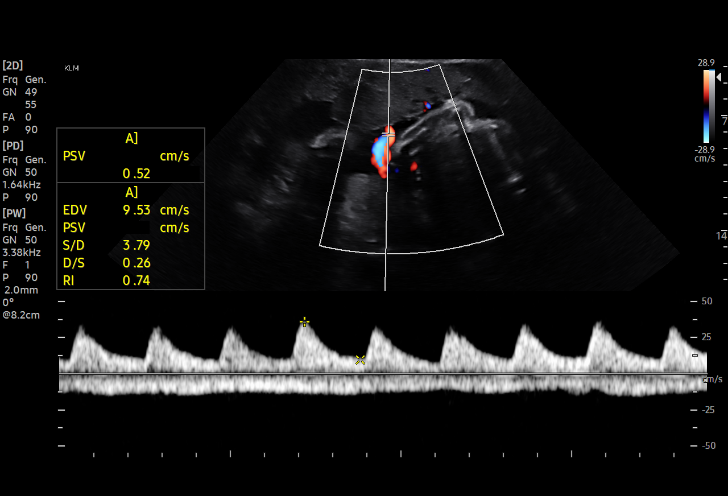
[im 18/28]
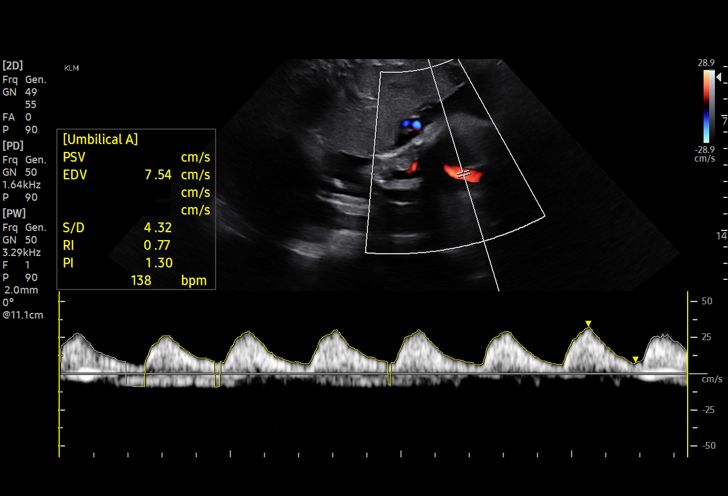
[im 20/28]
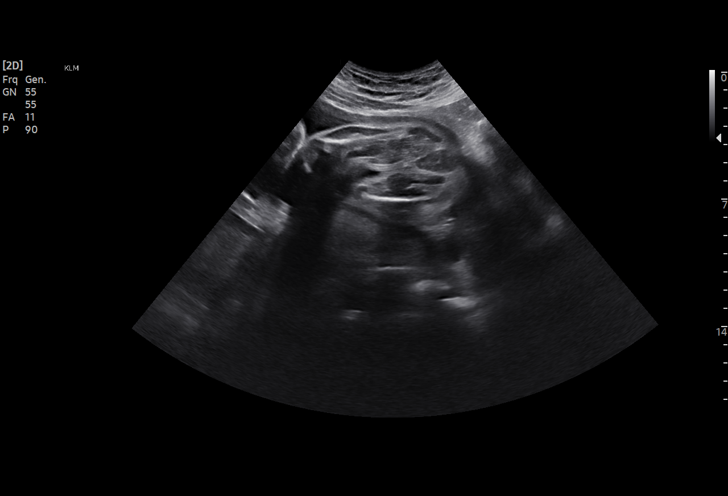
[im 22/28]
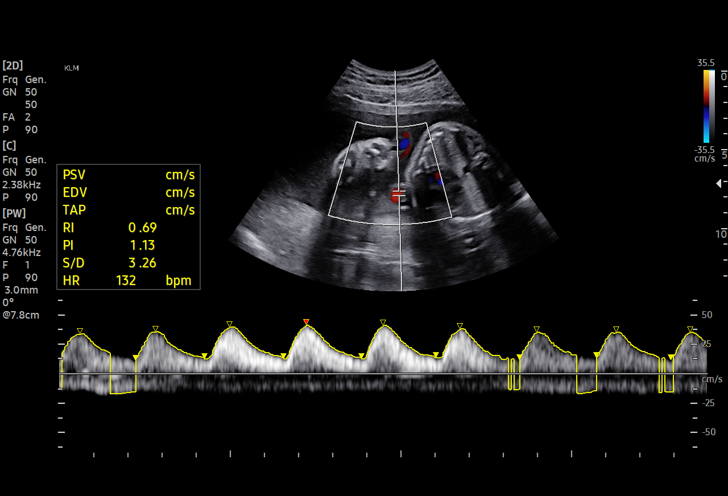
[im 24/28]
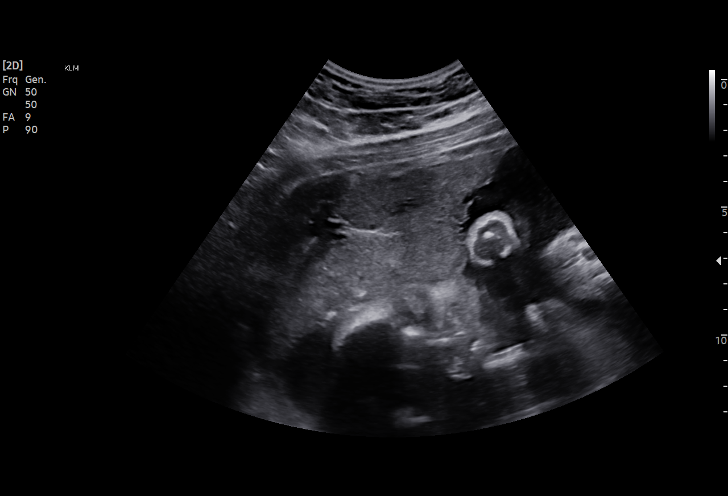
[im 26/28]
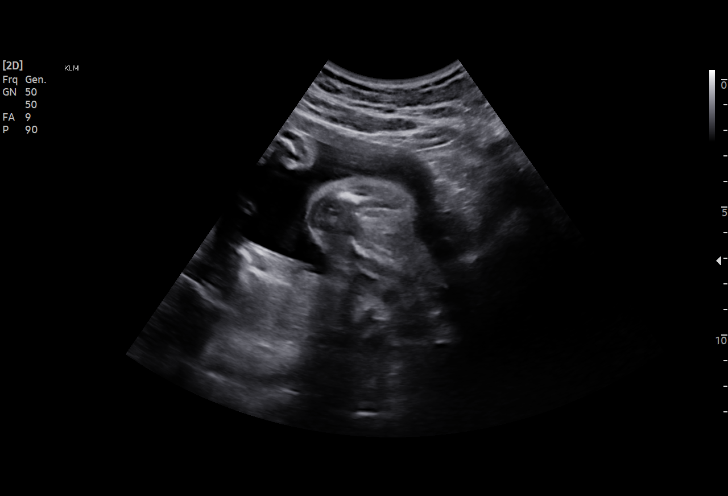
[im 28/28]
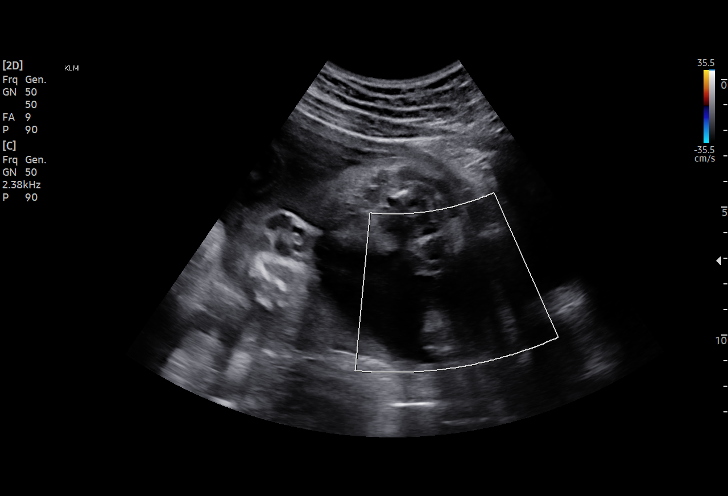

[15 of 28 positions shown; findings below may reference images not displayed]

[place]


Indications

 Maternal care for known or suspected poor
 fetal growth, second trimester, not applicable
 or unspecified IUGR
 Cervical shortening, second trimester
 (progesterone)
 27 weeks gestation of pregnancy
 Abnormal finding on antenatal screening
 (elevated AFP 2.84)
 Genetic carrier (SMA)
 Uterine fibroids affecting pregnancy,
 unspecified trimestser
Fetal Evaluation

 Num Of Fetuses:         1
 Fetal Heart Rate(bpm):  136
 Cardiac Activity:       Observed
 Presentation:           Breech

 Amniotic Fluid
 AFI FV:      Within normal limits

                             Largest Pocket(cm)

OB History

 Gravidity:    2         Term:   0        Prem:   0        SAB:   1
 TOP:          0       Ectopic:  0        Living: 0
Gestational Age

 Best:          27w 5d     Det. By:  Early Ultrasound         EDD:   06/05/20
                                     (11/14/19)
Anatomy

 Stomach:               Appears normal, left   Bladder:                Appears normal
                        sided
Doppler - Fetal Vessels

 Umbilical Artery
  S/D     %tile      RI    %tile      PI    %tile     PSV    ADFV    RDFV
                                                    (cm/s)
  3.81       83    0.74       83    1.[REDACTED]      No      No

Comments

 This patient was seen due to severe IUGR and an elevated
 MSAFP of 2.84.  Her pregnancy has also been complicated
 by a shortened cervix.  She denies any problems since her
 last exam and denies feeling any contractions.
 The patient had a reactive nonstress test for her gestational
 age today.
 There was normal amniotic fluid noted on today's ultrasound
 exam.  Fetal movements were noted throughout today's
 ultrasound exam.
 Doppler studies of the umbilical arteries performed due to
 fetal growth restriction showed a normal S/D ratio of 3.81.
 There were no signs of absent or reversed end-diastolic flow
 noted today.
 A follow-up exam was scheduled in 1 week.

## 2020-09-20 IMAGING — US US MFM UA CORD DOPPLER
1 series · 13 of 28 positions shown · non-contrast
Comparison: none

[Series 1: us mfm ua cord doppler · 65 acquisitions, 13 frames shown]
[im 3/65]
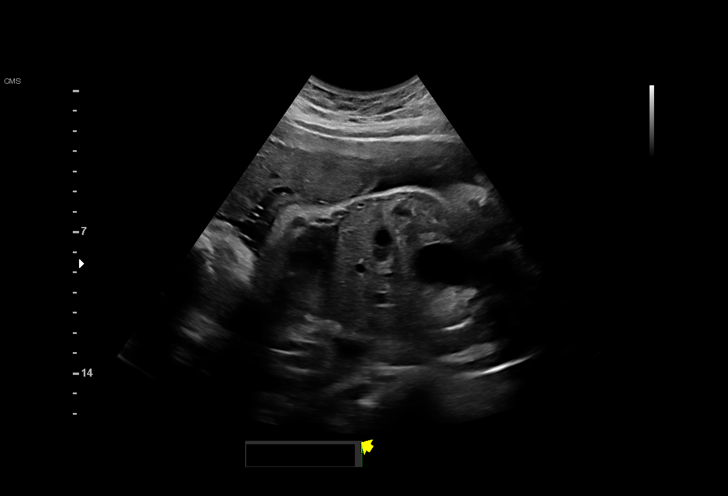
[im 8/65]
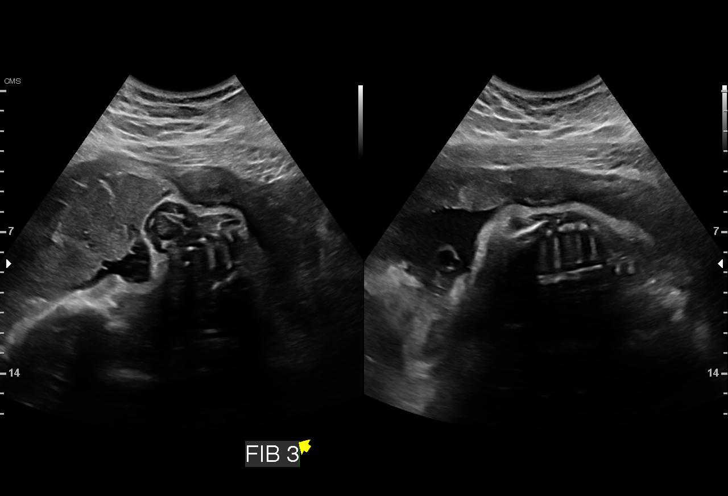
[im 12/65]
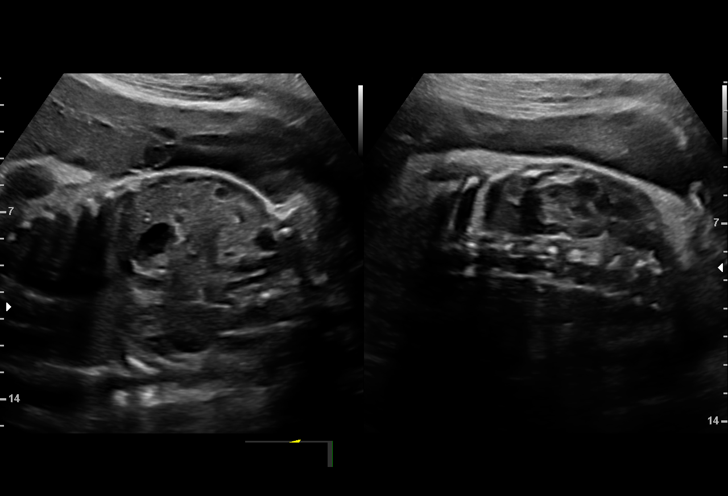
[im 17/65]
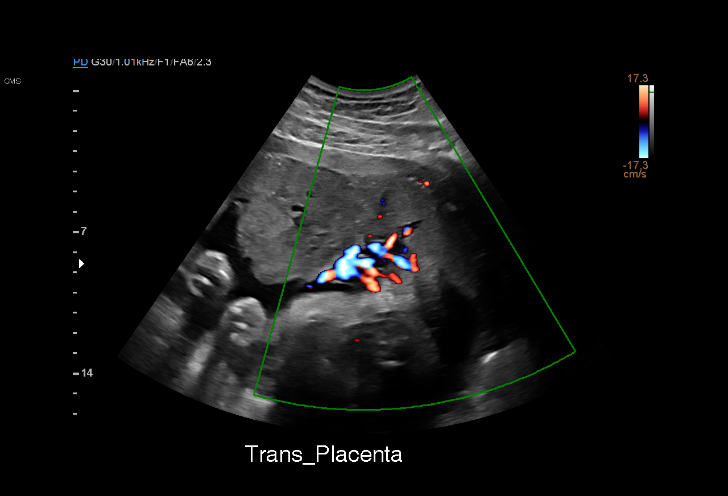
[im 22/65]
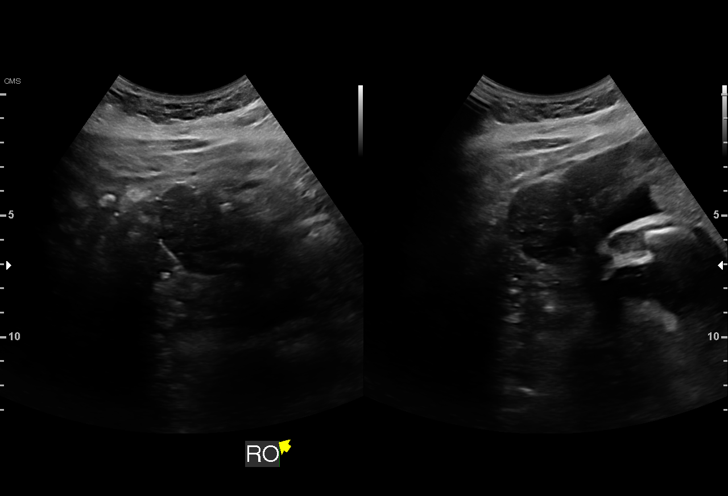
[im 27/65]
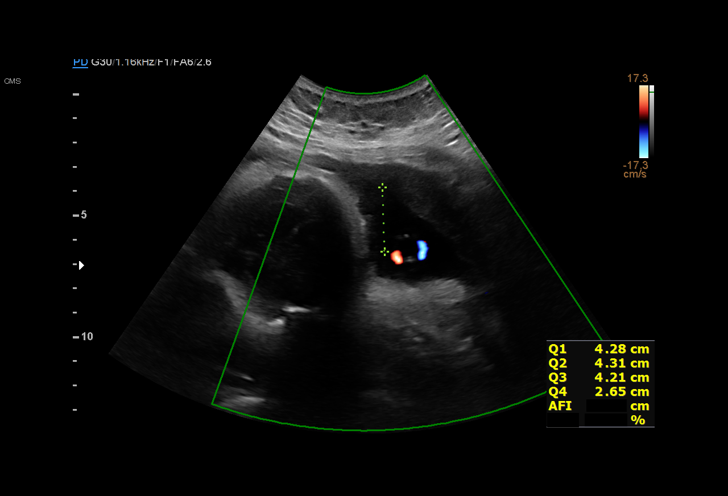
[im 34/65]
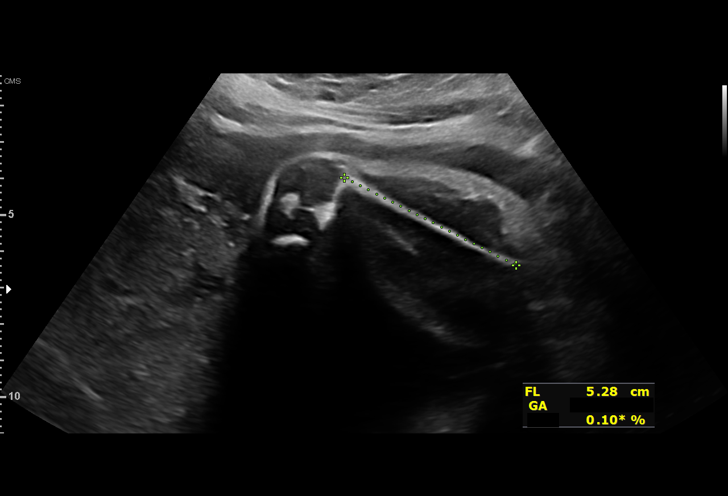
[im 38/65]
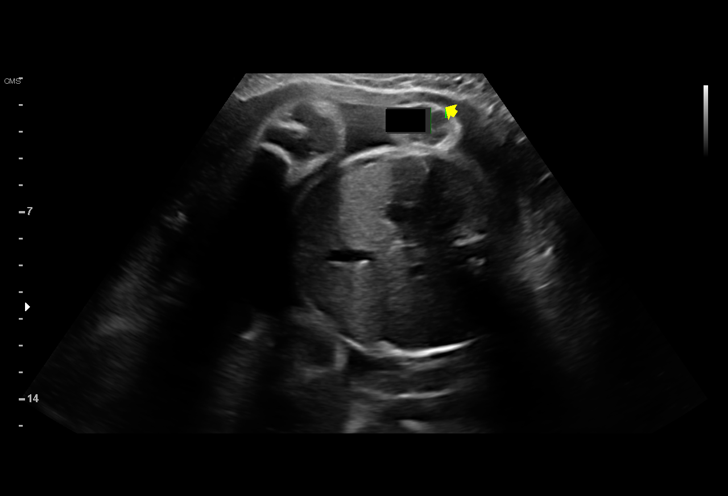
[im 43/65]
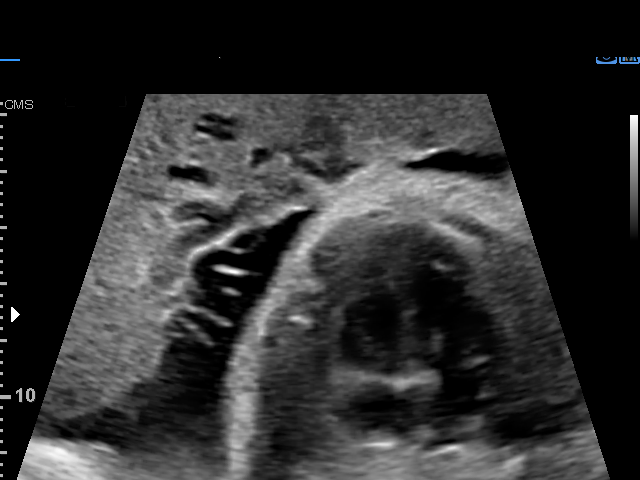
[im 48/65]
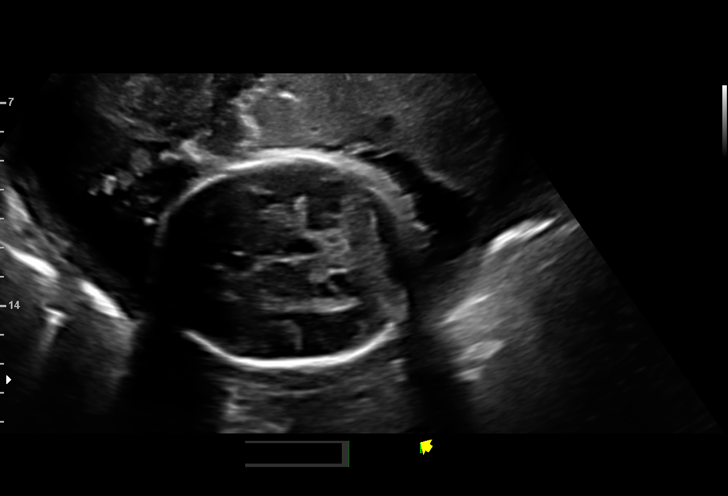
[im 53/65]
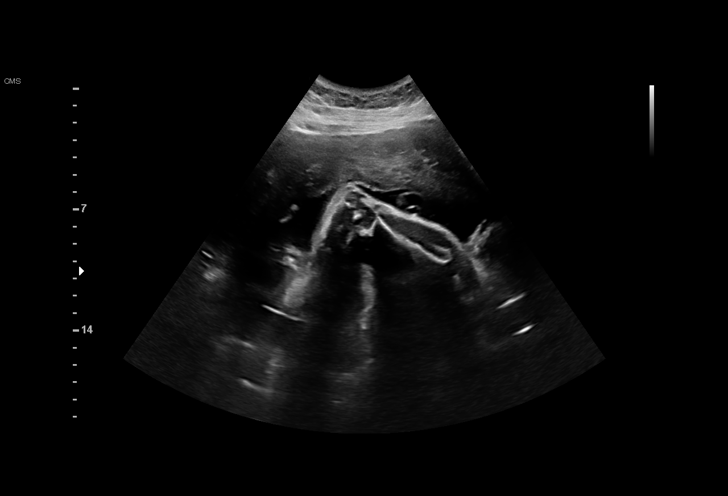
[im 57/65]
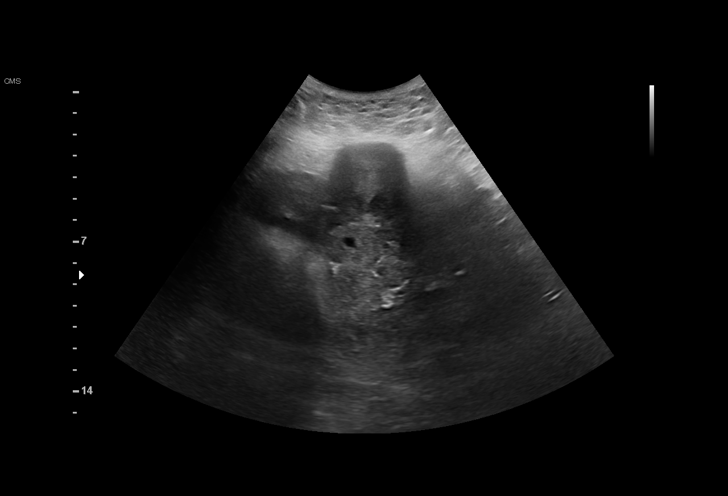
[im 62/65]
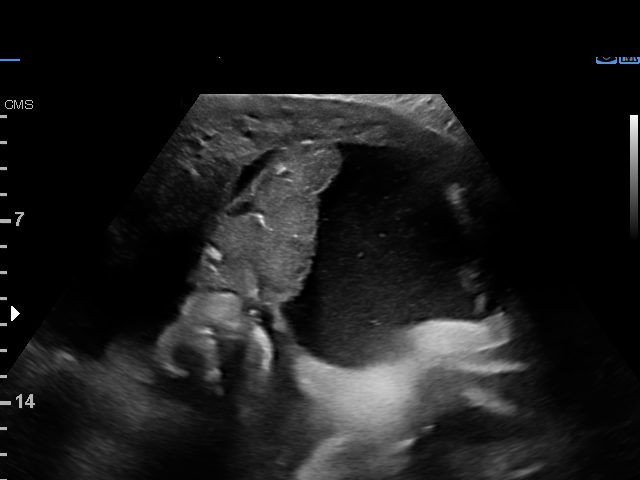

[13 of 28 positions shown; findings below may reference images not displayed]

[place]


Indications

 Maternal care for known or suspected poor
 fetal growth, third trimester, not applicable or
 unspecified IUGR
 Abnormal finding on antenatal screening
 (elevated AFP 2.84)
 Genetic carrier (SMA)
 Uterine fibroids affecting pregnancy in third  O34.13,
 trimester, antepartum
 Cervical shortening, third trimester
 (progesterone)
 Hypertension - Chronic/Pre-existing (ASA)
 31 weeks gestation of pregnancy
Fetal Evaluation

 Num Of Fetuses:         1
 Fetal Heart Rate(bpm):  136
 Cardiac Activity:       Observed
 Presentation:           Breech
 Placenta:               Anterior
 P. Cord Insertion:      Previously Visualized
 Amniotic Fluid
 AFI FV:      Within normal limits

 AFI Sum(cm)     %Tile       Largest Pocket(cm)
 15.5            55

 RUQ(cm)       RLQ(cm)       LUQ(cm)        LLQ(cm)

Biophysical Evaluation

 Amniotic F.V:   Within normal limits       F. Tone:        Observed
 F. Movement:    Observed                   Score:          [DATE]
 F. Breathing:   Observed
Biometry

 BPD:      70.5  mm     G. Age:  28w 2d        < 1  %    CI:         65.6   %    70 - 86
                                                         FL/HC:      18.9   %    19.1 -
 HC:      279.4  mm     G. Age:  30w 4d        2.7  %    HC/AC:      1.07        0.96 -
 AC:      262.1  mm     G. Age:  30w 2d         13  %    FL/BPD:     74.9   %    71 - 87
 FL:       52.8  mm     G. Age:  28w 1d        < 1  %    FL/AC:      20.1   %    20 - 24
 HUM:      48.8  mm     G. Age:  28w 5d        < 5  %

 Est. FW:    2153  gm      3 lb 1 oz      2  %
OB History

 Gravidity:    2         Term:   0        Prem:   0        SAB:   1
 TOP:          0       Ectopic:  0        Living: 0
Gestational Age

 U/S Today:     29w 2d                                        EDD:   06/22/20
 Best:          31w 5d     Det. By:  Early Ultrasound         EDD:   06/05/20
                                     (11/14/19)
Anatomy

 Cranium:               Appears normal         LVOT:                   Appears normal
 Cavum:                 Appears normal         Aortic Arch:            Previously seen
 Ventricles:            Previously seen        Ductal Arch:            Previously seen
 Choroid Plexus:        Bilateral CPC prev     Diaphragm:              Previously seen
                        seen
 Cerebellum:            Previously seen        Stomach:                Appears normal, left
                                                                       sided
 Posterior Fossa:       Previously seen        Abdomen:                Previously seen
 Nuchal Fold:           Previously seen        Abdominal Wall:         Previously seen
 Face:                  Orbits and profile     Cord Vessels:           Previously seen
                        previously seen
 Lips:                  Previously seen        Kidneys:                Appear normal
 Palate:                Previously seen        Bladder:                Appears normal
 Thoracic:              Appears normal         Spine:                  Previously seen
 Heart:                 Appears normal         Upper Extremities:      Previously seen
                        (4CH, axis, and
                        situs)
 RVOT:                  Previously seen        Lower Extremities:      Previously seen

 Other:  Heels previously visualized. Nasal bone previously visualized.
Doppler - Fetal Vessels

 Umbilical Artery
  S/D     %tile      RI    %tile                             ADFV    RDFV
  3.32       81     0.7       83                                No      No

Cervix Uterus Adnexa

 Cervix
 Not visualized (advanced GA >40wks)

 Uterus
 No abnormality visualized.

 Right Ovary
 Not visualized.

 Left Ovary
 Not visualized.

 Cul De Sac
 No free fluid seen.

 Adnexa
 No abnormality visualized.
Myomas

 Site                     L(cm)      W(cm)      D(cm)       Location
 Anterior                 6.1        5.2        4
 Anterior

 Blood Flow                  RI       PI       Comments

Impression

 Severe fetal growth restriction.  Patient returned for fetal
 growth assessment and antenatal testing.  She reports good
 fetal movements.
 On today's ultrasound, the estimated fetal weight is at the 2nd
 percentile (interval weight gain 359 g).Amniotic fluid is normal
 and good fetal activity is seen .Antenatal testing is
 reassuring. BPP [DATE].  Umbilical artery Doppler showed
 normal forward diastolic flow.
 Two anterior intramural myomas are seen (measurements
 above).

 I explained the significance of fetal growth restriction and the
 protocol of antenatal testing.  We would recommend timing of
 delivery after next fetal growth assessment.
Recommendations

 -Continue weekly BPP, NST and UA Doppler till delivery.
                 Amazigh, Quirijn

## 2020-10-04 IMAGING — US US MFM UA CORD DOPPLER
1 series · 15 of 28 positions shown · non-contrast
Comparison: none

[Series 1: us mfm ua cord doppler · 31 acquisitions, 15 frames shown]
[im 1/31]
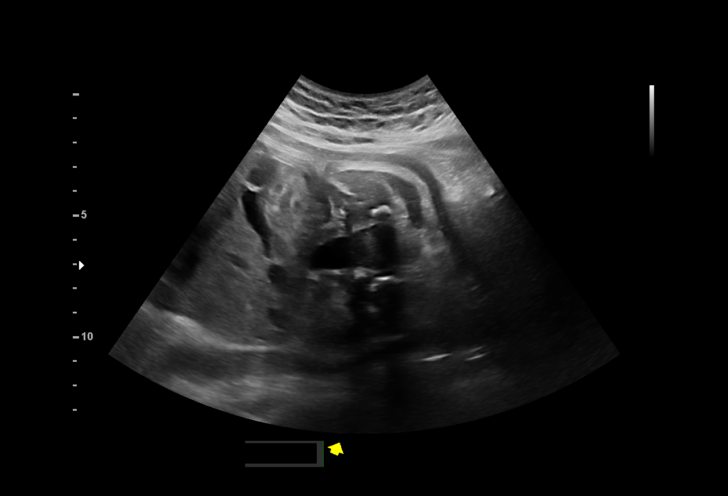
[im 3/31]
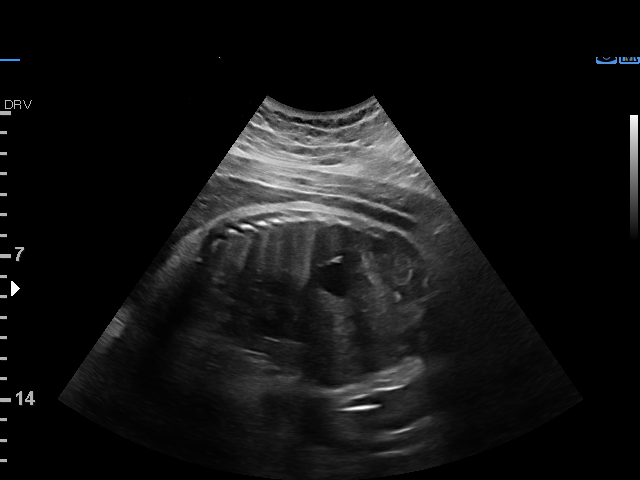
[im 5/31]
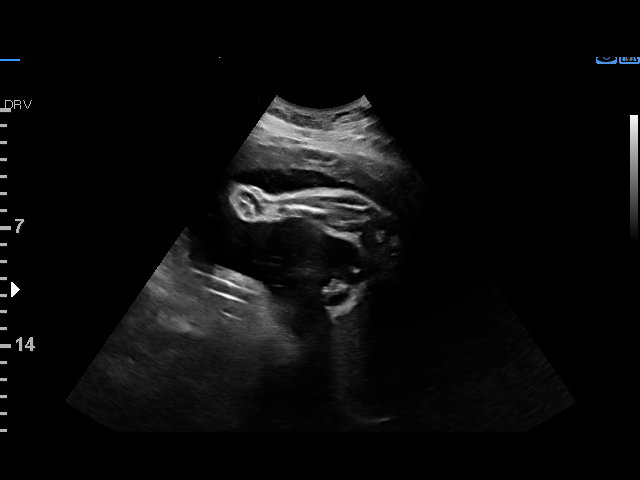
[im 7/31]
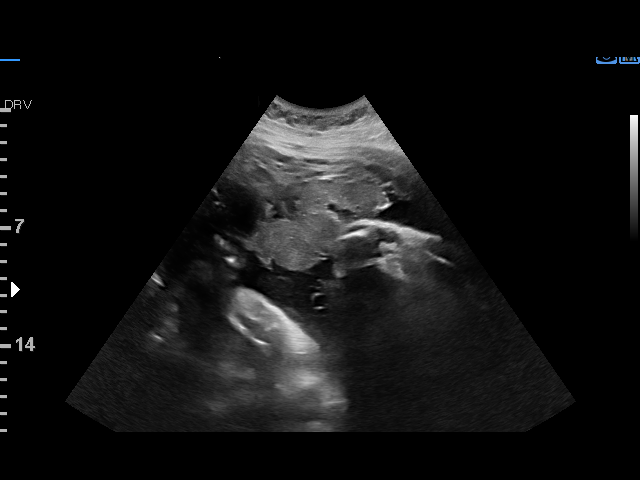
[im 9/31]
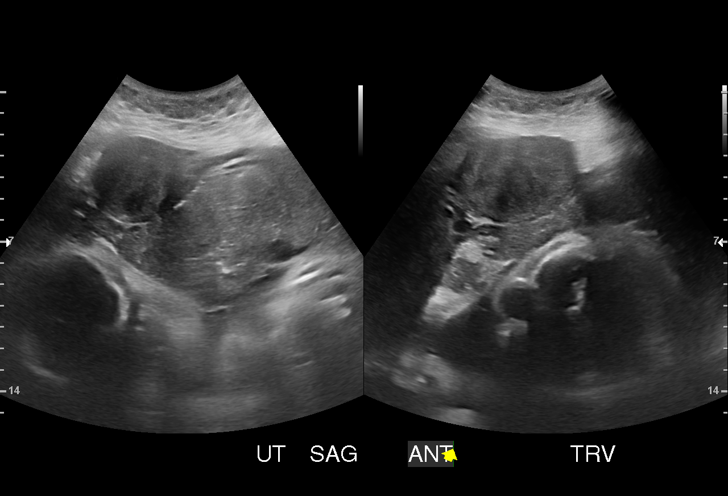
[im 12/31]
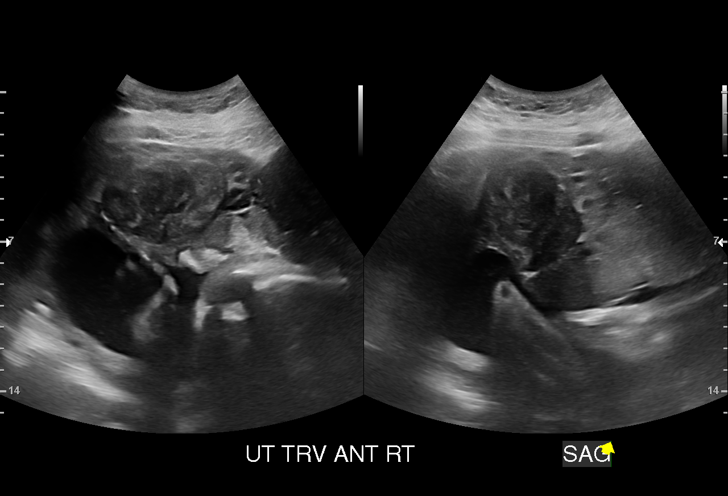
[im 14/31]
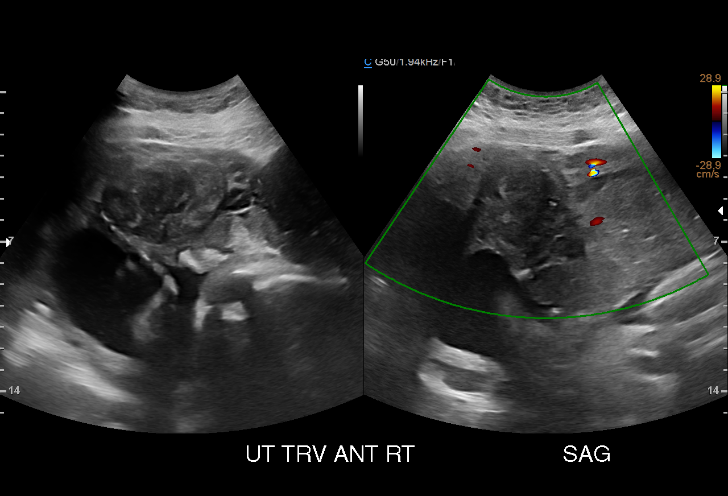
[im 16/31]
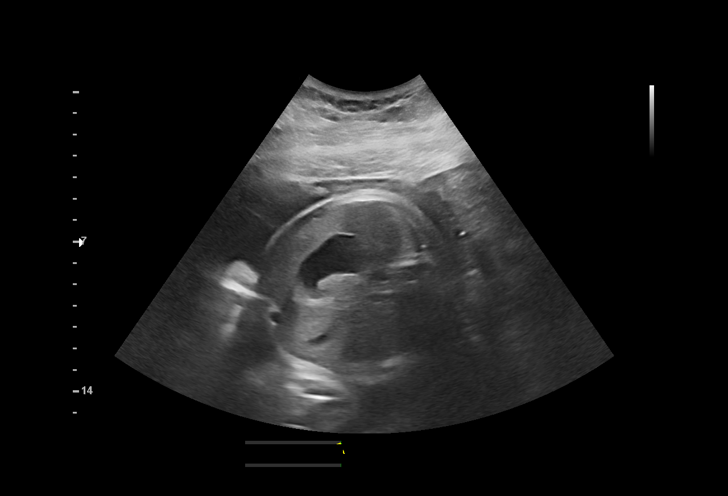
[im 17/31]
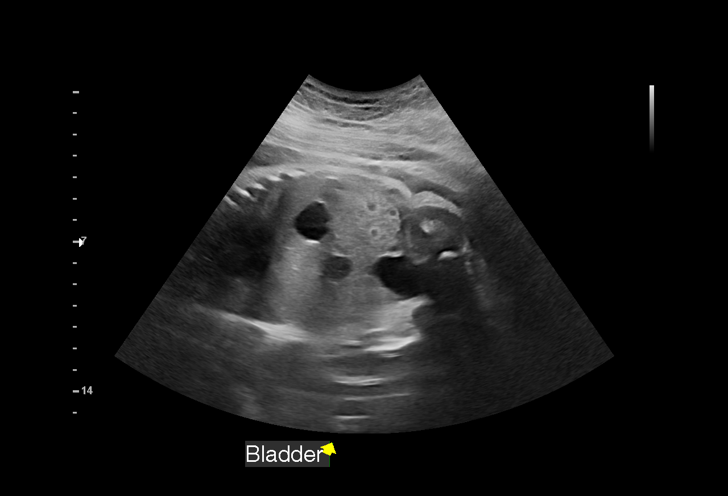
[im 19/31]
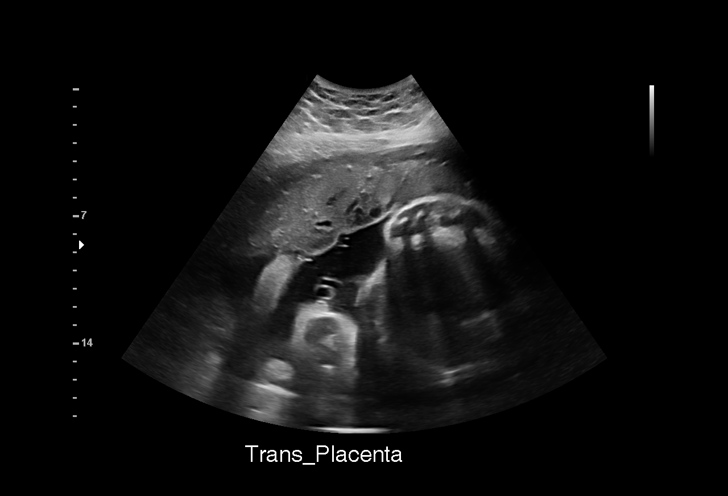
[im 22/31]
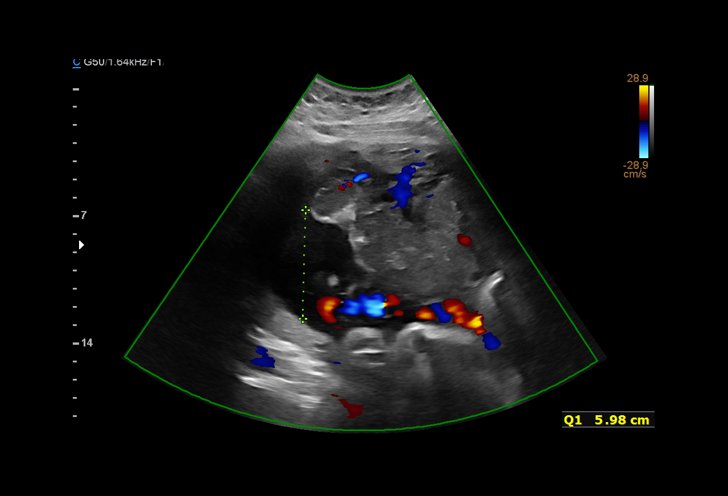
[im 24/31]
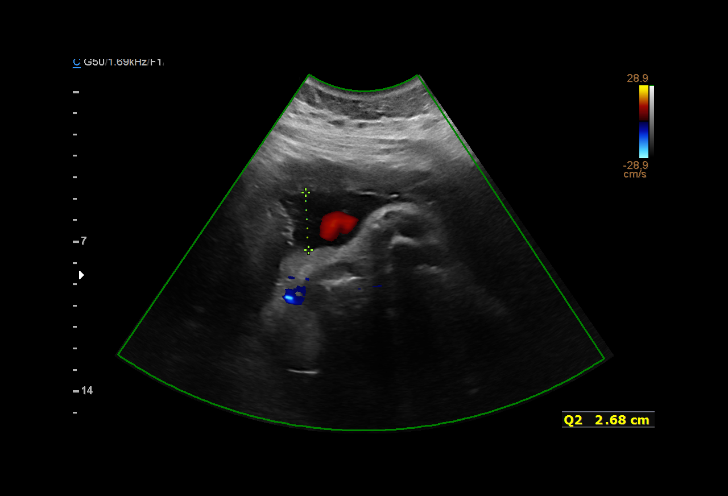
[im 26/31]
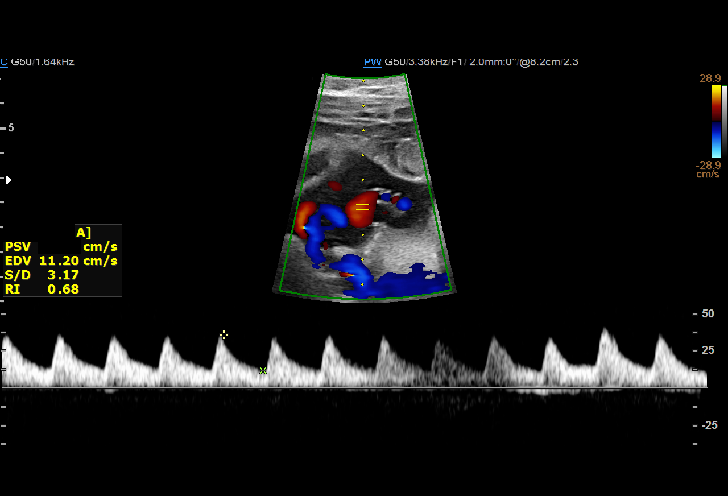
[im 28/31]
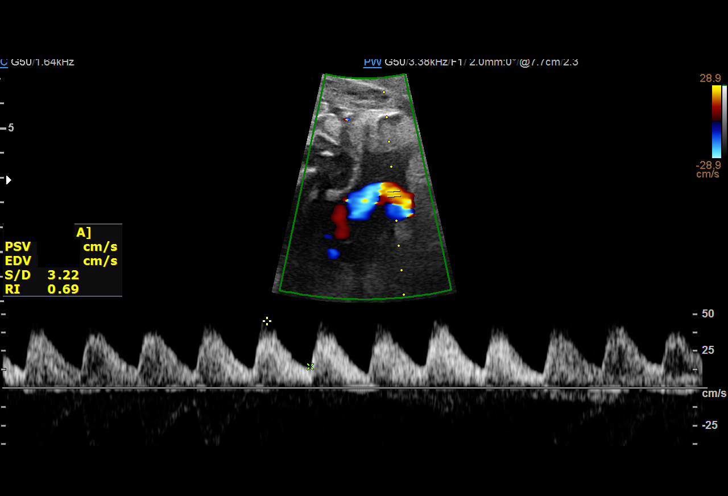
[im 31/31]
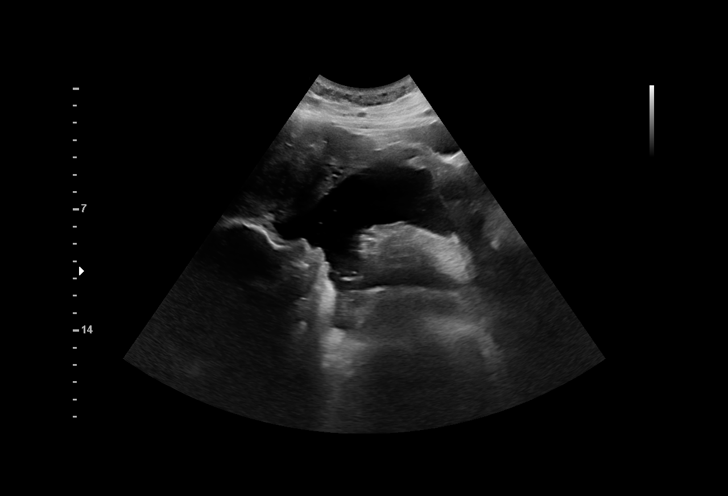

[15 of 28 positions shown; findings below may reference images not displayed]

[facility]-Kilian


    W/NONSTRESS

Indications

 Maternal care for known or suspected poor
 fetal growth, third trimester, not applicable or
 unspecified IUGR
 33 weeks gestation of pregnancy
 Abnormal finding on antenatal screening
 (elevated AFP 2.84)
 Genetic carrier (SMA)
 Uterine fibroids affecting pregnancy in third  O34.13,
 trimester, antepartum
 Cervical shortening, third trimester
 (progesterone)
 Hypertension - Chronic/Pre-existing (ASA)
Fetal Evaluation

 Num Of Fetuses:         1
 Fetal Heart Rate(bpm):  145
 Cardiac Activity:       Observed
 Presentation:           Breech
 Placenta:               Anterior Fundal
 P. Cord Insertion:      Previously Visualized
 Amniotic Fluid
 AFI FV:      Within normal limits

 AFI Sum(cm)     %Tile       Largest Pocket(cm)
 16.44           59

 RUQ(cm)       RLQ(cm)       LUQ(cm)        LLQ(cm)

Biophysical Evaluation

 Amniotic F.V:   Pocket => 2 cm             F. Tone:        Observed
 F. Movement:    Observed                   N.S.T:          Reactive
 F. Breathing:   Observed                   Score:          [DATE]
OB History

 Gravidity:    2         Term:   0        Prem:   0        SAB:   1
 TOP:          0       Ectopic:  0        Living: 0
Gestational Age

 Best:          33w 5d     Det. By:  Early Ultrasound         EDD:   06/05/20
                                     (11/14/19)
Doppler - Fetal Vessels

 Umbilical Artery
  S/D     %tile      RI    %tile                             ADFV    RDFV
  3.22       83    0.69       86                                No      No

Cervix Uterus Adnexa

 Cervix
 Not visualized (advanced GA >65wks)
Impression

 Patient with severe fetal growth restriction return to for
 antenatal testing.  At 23 weeks gestation, the cervix
 measured 7 mm and the patient received antenatal
 corticosteroids.  She takes vaginal progesterone daily.
 Amniotic fluid is normal and good fetal activity is seen .
 Breech presentation.  Antenatal testing is reassuring.  NST is
 reactive.  BPP [DATE].

 BP at our office: 132/77 mm Hg .
Recommendations

 -Fetal growth assessment next week.
 -Weekly BPP till delivery.
 -Recommend delivery at 37 weeks gestation if severe fetal
 growth restriction persists.
                 Mannerud, Gunn Eva

## 2021-04-01 ENCOUNTER — Other Ambulatory Visit (HOSPITAL_COMMUNITY)
Admission: RE | Admit: 2021-04-01 | Discharge: 2021-04-01 | Disposition: A | Discharge status: Home / Self Care | Payer: Managed Care, Other (non HMO) | Source: Ambulatory Visit | Attending: Obstetrics | Admitting: Obstetrics

## 2021-04-01 ENCOUNTER — Ambulatory Visit (INDEPENDENT_AMBULATORY_CARE_PROVIDER_SITE_OTHER): Payer: Managed Care, Other (non HMO) | Admitting: Obstetrics

## 2021-04-01 ENCOUNTER — Other Ambulatory Visit: Payer: Self-pay

## 2021-04-01 ENCOUNTER — Encounter: Payer: Self-pay | Admitting: Obstetrics

## 2021-04-01 VITALS — BP 150/94 | HR 78 | Ht 62.0 in | Wt 195.1 lb

## 2021-04-01 DIAGNOSIS — Z01419 Encounter for gynecological examination (general) (routine) without abnormal findings: Secondary | ICD-10-CM | POA: Insufficient documentation

## 2021-04-01 DIAGNOSIS — N898 Other specified noninflammatory disorders of vagina: Secondary | ICD-10-CM

## 2021-04-01 DIAGNOSIS — Z113 Encounter for screening for infections with a predominantly sexual mode of transmission: Secondary | ICD-10-CM | POA: Diagnosis not present

## 2021-04-01 DIAGNOSIS — I10 Essential (primary) hypertension: Secondary | ICD-10-CM | POA: Diagnosis not present

## 2021-04-01 DIAGNOSIS — D508 Other iron deficiency anemias: Secondary | ICD-10-CM

## 2021-04-01 MED ORDER — CARVEDILOL 12.5 MG PO TABS: 12.5000 mg | ORAL_TABLET | Freq: Two times a day (BID) | ORAL | 11 refills | Status: DC

## 2021-04-01 MED ORDER — FERROUS SULFATE 325 (65 FE) MG PO TABS: ORAL_TABLET | ORAL | 1 refills | Status: DC

## 2021-04-01 MED ORDER — AMLODIPINE BESYLATE 5 MG PO TABS: 5.0000 mg | ORAL_TABLET | Freq: Every day | ORAL | 11 refills | Status: DC

## 2021-04-01 MED ORDER — PREPLUS 27-1 MG PO TABS: 1.0000 | ORAL_TABLET | Freq: Every day | ORAL | 13 refills | Status: DC

## 2021-04-01 NOTE — Progress Notes (Signed)
Subjective:        Sally Taylor is a 30 y.o. female here for a routine exam.  Current complaints: Vaginal discharge and irritation.    Personal health questionnaire:  Is patient Ashkenazi Jewish, have a family history of breast and/or ovarian cancer: no Is there a family history of uterine cancer diagnosed at age < 64, gastrointestinal cancer, urinary tract cancer, family member who is a Field seismologist syndrome-associated carrier: no Is the patient overweight and hypertensive, family history of diabetes, personal history of gestational diabetes, preeclampsia or PCOS: no Is patient over 39, have PCOS,  family history of premature CHD under age 8, diabetes, smoke, have hypertension or peripheral artery disease:  no At any time, has a partner hit, kicked or otherwise hurt or frightened you?: no Over the past 2 weeks, have you felt down, depressed or hopeless?: no Over the past 2 weeks, have you felt little interest or pleasure in doing things?:no   Gynecologic History No LMP recorded. Patient has had an implant. Contraception: Nexplanon Last Pap: 08-22-2018. Results were: normal Last mammogram: n/a. Results were: n/a  Obstetric History OB History  Gravida Para Term Preterm AB Living  2 1 0 '1 1 1  '$ SAB IAB Ectopic Multiple Live Births  1 0 0 0 1    # Outcome Date GA Lbr Len/2nd Weight Sex Delivery Anes PTL Lv  2 Preterm 05/14/20 [redacted]w[redacted]d  F CS-LTranv Spinal  LIV     Complications: Transverse lie of fetus  1 SAB 08/12/19 8101w2d         Past Medical History:  Diagnosis Date   Asthma    Headache    Hypertension    Pelvic pain affecting pregnancy in second trimester, antepartum 02/07/2020    Past Surgical History:  Procedure Laterality Date   CESAREAN SECTION N/A 05/14/2020   Procedure: CESAREAN SECTION;  Surgeon: WiCherre BlancMD;  Location: [facility];  Service: Obstetrics;  Laterality: N/A;   LAPAROSCOPIC APPENDECTOMY N/A 04/01/2017   Procedure: APPENDECTOMY  LAPAROSCOPIC;  Surgeon: MaJohnathan HausenMD;  Location: [facility] ORS;  Service: General;  Laterality: N/A;     Current Outpatient Medications:    acetaminophen (TYLENOL) 325 MG tablet, Take 3 tablets (975 mg total) by mouth every 6 (six) hours as needed for mild pain, moderate pain or headache. (Patient not taking: No sig reported), Disp: , Rfl:    amLODipine (NORVASC) 10 MG tablet, TAKE 1 TABLET (10 MG TOTAL) BY MOUTH DAILY. (Patient not taking: Reported on 04/01/2021), Disp: 45 tablet, Rfl: 0   ferrous sulfate 325 (65 FE) MG tablet, TAKE 1 TABLET (325 MG TOTAL) BY MOUTH EVERY OTHER DAY. (Patient not taking: No sig reported), Disp: 15 tablet, Rfl: 1   ibuprofen (ADVIL) 800 MG tablet, TAKE 1 TABLET (800 MG TOTAL) BY MOUTH EVERY SIX HOURS. (Patient not taking: Reported on 04/01/2021), Disp: 30 tablet, Rfl: 0   Prenatal Vit-Fe Fumarate-FA (PREPLUS) 27-1 MG TABS, Take 1 tablet by mouth daily. (Patient not taking: No sig reported), Disp: 30 tablet, Rfl: 13 No Known Allergies  Social History   Tobacco Use   Smoking status: Never   Smokeless tobacco: Never  Substance Use Topics   Alcohol use: Not Currently    Comment: occ    Family History  Problem Relation Age of Onset   Hypertension Mother       Review of Systems  Constitutional: negative for fatigue and weight loss Respiratory: negative for cough and wheezing Cardiovascular:  negative for chest pain, fatigue and palpitations Gastrointestinal: negative for abdominal pain and change in bowel habits Musculoskeletal:negative for myalgias Neurological: negative for gait problems and tremors Behavioral/Psych: negative for abusive relationship, depression Endocrine: negative for temperature intolerance    Genitourinary: positive for vaginal discharge and irritationnegative for abnormal menstrual periods, genital lesions, hot flashes, sexual problems  Integument/breast: negative for breast lump, breast tenderness, nipple discharge and skin  lesion(s)    Objective:       BP (!) 150/94   Pulse 78   Ht '5\' 2"'$  (1.575 m)   Wt 195 lb 1.6 oz (88.5 kg)   BMI 35.68 kg/m  General:   Alert and no distress  Skin:   no rash or abnormalities  Lungs:   clear to auscultation bilaterally  Heart:   regular rate and rhythm, S1, S2 normal, no murmur, click, rub or gallop  Breasts:   normal without suspicious masses, skin or nipple changes or axillary nodes  Abdomen:  normal findings: no organomegaly, soft, non-tender and no hernia  Pelvis:  External genitalia: normal general appearance Urinary system: urethral meatus normal and bladder without fullness, nontender Vaginal: normal without tenderness, induration or masses Cervix: normal appearance Adnexa: normal bimanual exam Uterus: anteverted and non-tender, normal size   Lab Review Urine pregnancy test Labs reviewed yes Radiologic studies reviewed no  I have spent a total of 20 minutes of face-to-face time, excluding clinical staff time, reviewing notes and preparing to see patient, ordering tests and/or medications, and counseling the patient.   Assessment:    1. Encounter for gynecological examination with Papanicolaou smear of cervix Rx: - Cytology - PAP( [place])  2. Vaginal discharge Rx: - Cervicovaginal ancillary only( )  3. Screen for STD (sexually transmitted disease) Rx: - HIV antibody (with reflex) - RPR - Hepatitis B Surface AntiGEN - Hepatitis C Antibody  4. Chronic hypertension Rx: - Ambulatory referral to Internal Medicine - carvedilol (COREG) 12.5 MG tablet; Take 1 tablet (12.5 mg total) by mouth 2 (two) times daily with a meal.  Dispense: 60 tablet; Refill: 11 - amLODipine (NORVASC) 5 MG tablet; Take 1 tablet (5 mg total) by mouth daily.  Dispense: 30 tablet; Refill: 11  5. Iron deficiency anemia secondary to inadequate dietary iron intake Rx: - Prenatal Vit-Fe Fumarate-FA (PREPLUS) 27-1 MG TABS; Take 1 tablet by mouth daily.  Dispense:  30 tablet; Refill: 13 - ferrous sulfate 325 (65 FE) MG tablet; TAKE 1 TABLET (325 MG TOTAL) BY MOUTH EVERY OTHER DAY.  Dispense: 15 tablet; Refill: 1     Plan:    Education reviewed: calcium supplements, depression evaluation, low fat, low cholesterol diet, safe sex/STD prevention, self breast exams, and weight bearing exercise. Contraception: Nexplanon. Follow up in: 1 year.   No orders of the defined types were placed in this encounter.  Orders Placed This Encounter  Procedures   HIV antibody (with reflex)   RPR   Hepatitis B Surface AntiGEN   Hepatitis C Antibody     Shelly Bombard, MD 04/01/2021 10:55 AM

## 2021-04-01 NOTE — Progress Notes (Signed)
Pt is in the office for annual. Last pap 08-22-2018 Currently has nexplanon since 2021 Reports some vaginal irritation, desires std testing.

## 2021-04-02 LAB — CERVICOVAGINAL ANCILLARY ONLY
Bacterial Vaginitis (gardnerella): NEGATIVE
Candida Glabrata: NEGATIVE
Candida Vaginitis: NEGATIVE
Chlamydia: NEGATIVE
Comment: NEGATIVE
Comment: NEGATIVE
Comment: NEGATIVE
Comment: NEGATIVE
Comment: NEGATIVE
Comment: NORMAL
Neisseria Gonorrhea: NEGATIVE
Trichomonas: POSITIVE — AB

## 2021-04-02 LAB — HEPATITIS B SURFACE ANTIGEN: Hepatitis B Surface Ag: NEGATIVE

## 2021-04-02 LAB — HEPATITIS C ANTIBODY: Hep C Virus Ab: <0.1 s/co ratio (ref 0.0–0.9)

## 2021-04-02 LAB — RPR: RPR Ser Ql: NONREACTIVE

## 2021-04-02 LAB — HIV ANTIBODY (ROUTINE TESTING W REFLEX): HIV Screen 4th Generation wRfx: NONREACTIVE

## 2021-04-03 ENCOUNTER — Encounter: Payer: Self-pay | Admitting: *Deleted

## 2021-04-03 ENCOUNTER — Other Ambulatory Visit: Payer: Self-pay | Admitting: Obstetrics

## 2021-04-03 DIAGNOSIS — B9689 Other specified bacterial agents as the cause of diseases classified elsewhere: Secondary | ICD-10-CM

## 2021-04-03 MED ORDER — METRONIDAZOLE 500 MG PO TABS: 500.0000 mg | ORAL_TABLET | Freq: Two times a day (BID) | ORAL | 2 refills | Status: DC

## 2021-04-03 NOTE — Progress Notes (Signed)
TC to patient to notify of trichomonas and RX for Flagyl. No answer. VM not set up. Patient is active in [place]. Message sent regarding diagnosis and RX. Patient education included in message.

## 2021-04-07 LAB — CYTOLOGY - PAP
Comment: NEGATIVE
Diagnosis: NEGATIVE
Diagnosis: REACTIVE
High risk HPV: NEGATIVE

## 2021-04-08 ENCOUNTER — Other Ambulatory Visit: Payer: Self-pay | Admitting: Obstetrics

## 2021-04-09 LAB — HM PAP SMEAR

## 2021-06-28 ENCOUNTER — Encounter (HOSPITAL_BASED_OUTPATIENT_CLINIC_OR_DEPARTMENT_OTHER): Payer: Self-pay

## 2021-06-28 ENCOUNTER — Emergency Department (HOSPITAL_BASED_OUTPATIENT_CLINIC_OR_DEPARTMENT_OTHER)
Admission: EM | Admit: 2021-06-28 | Discharge: 2021-06-28 | Disposition: A | Discharge status: Home / Self Care | Payer: Managed Care, Other (non HMO) | Attending: Emergency Medicine | Admitting: Emergency Medicine

## 2021-06-28 ENCOUNTER — Other Ambulatory Visit: Payer: Self-pay

## 2021-06-28 DIAGNOSIS — Z79899 Other long term (current) drug therapy: Secondary | ICD-10-CM | POA: Diagnosis not present

## 2021-06-28 DIAGNOSIS — I10 Essential (primary) hypertension: Secondary | ICD-10-CM | POA: Insufficient documentation

## 2021-06-28 DIAGNOSIS — H9201 Otalgia, right ear: Secondary | ICD-10-CM | POA: Insufficient documentation

## 2021-06-28 DIAGNOSIS — J45909 Unspecified asthma, uncomplicated: Secondary | ICD-10-CM | POA: Insufficient documentation

## 2021-06-28 NOTE — ED Notes (Signed)
Pt discharged to home. Discharge instructions have been discussed with patient and/or family members. Pt verbally acknowledges understanding d/c instructions, and endorses comprehension to checkout at registration before leaving.  °

## 2021-06-28 NOTE — Discharge Instructions (Signed)
Recommend use of antihistamines like Benadryl or Zyrtec for ear pain. If pain continues, see your doctor for recheck.   Return to the ED with any new or worsening symptoms - severe pain, drainage or bleeding from the ear, high fever.

## 2021-06-28 NOTE — ED Provider Notes (Signed)
[facility] EMERGENCY DEPT Provider Note   CSN: [id number] Arrival date & time: 06/28/21  1540     History Chief Complaint  Patient presents with   Otalgia    Sally Taylor is a 30 y.o. female.  Patient to ED complains of right ear pain that started as popping earlier today. Since onset the symptoms have become constant sharp pain. No hearing changes. No drainage or bleeding. She denies sinus congestion, sore throat, cough or fever.   The history is provided by the patient. No language interpreter was used.  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   Chronic hypertension 02/14/2020   Short cervix during pregnancy in third trimester 02/12/2020   IUGR (intrauterine growth restriction) affecting care of mother 02/06/2020   Genetic carrier status 12/05/2019   History of ectopic pregnancy 08/28/2019    Past Surgical History:  Procedure Laterality Date   CESAREAN SECTION N/A 05/14/2020   Procedure: CESAREAN SECTION;  Surgeon: Cherre Blanc, MD;  Location: [facility];  Service: Obstetrics;  Laterality: N/A;   LAPAROSCOPIC APPENDECTOMY N/A 04/01/2017   Procedure: APPENDECTOMY LAPAROSCOPIC;  Surgeon: Johnathan Hausen, MD;  Location: [facility];  Service: General;  Laterality: N/A;     OB History     Gravida  2   Para  1   Term  0   Preterm  1   AB  1   Living  1      SAB  1   IAB  0   Ectopic  0   Multiple  0   Live Births  1           Family History  Problem Relation Age of Onset   Hypertension Mother     Social History    Tobacco Use   Smoking status: Never   Smokeless tobacco: Never  Vaping Use   Vaping Use: Never used  Substance Use Topics   Alcohol use: Not Currently    Comment: occ   Drug use: No    Home Medications Prior to Admission medications   Medication Sig Start Date End Date Taking? Authorizing Provider  acetaminophen (TYLENOL) 325 MG tablet Take 3 tablets (975 mg total) by mouth every 6 (six) hours as needed for mild pain, moderate pain or headache. Patient not taking: No sig reported 12/25/19   Lajean Manes, CNM  amLODipine (NORVASC) 5 MG tablet Take 1 tablet (5 mg total) by mouth daily. 04/01/21   Shelly Bombard, MD  carvedilol (COREG) 12.5 MG tablet Take 1 tablet (12.5 mg total) by mouth 2 (two) times daily with a meal. 04/01/21   Shelly Bombard, MD  ferrous sulfate 325 (65 FE) MG tablet TAKE 1 TABLET (325 MG TOTAL) BY MOUTH EVERY OTHER DAY. 04/01/21 04/01/22  Shelly Bombard, MD  metroNIDAZOLE (FLAGYL) 500 MG tablet Take 1 tablet (500 mg total) by mouth 2 (two) times daily. 04/03/21   Shelly Bombard, MD  Prenatal Vit-Fe Fumarate-FA (PREPLUS) 27-1 MG TABS Take 1 tablet by mouth daily. 04/01/21   Shelly Bombard,  MD    Allergies    Patient has no known allergies.  Review of Systems   Review of Systems  Constitutional:  Negative for fever.  HENT:  Positive for ear pain. Negative for congestion and sore throat.   Respiratory:  Negative for cough.   Gastrointestinal:  Negative for nausea.   Physical Exam Updated Vital Signs BP (!) 144/88 (BP Location: Right Arm)   Pulse 69   Temp 98.4 F (36.9 C)   Resp 16   SpO2 100%   Physical Exam Constitutional:      General: She is not in acute distress.    Appearance: Normal appearance.  HENT:     Head: Normocephalic.     Right Ear: Tympanic membrane and ear canal normal.     Left Ear: Tympanic membrane and ear canal normal.     Nose: Nose normal.     Mouth/Throat:     Mouth: Mucous membranes are moist.   Cardiovascular:     Rate and Rhythm: Normal rate.  Pulmonary:     Effort: Pulmonary effort is normal.  Musculoskeletal:     Cervical back: Normal range of motion and neck supple.  Neurological:     Mental Status: She is alert.    ED Results / Procedures / Treatments   Labs (all labs ordered are listed, but only abnormal results are displayed) Labs Reviewed - No data to display  EKG None  Radiology No results found.  Procedures Procedures   Medications Ordered in ED Medications - No data to display  ED Course  I have reviewed the triage vital signs and the nursing notes.  Pertinent labs & imaging results that were available during my care of the patient were reviewed by me and considered in my medical decision making (see chart for details).    MDM Rules/Calculators/A&P                           Patient to ED c/o right ear pain since earlier today as detailed in the HPI.   Exam is reassuring. No evidence of otitis media/externa. She reports history of allergies. No evidence of sinus disease. Recommend antihistamines (h/o HTN) for symptomatic relief. Follow up PCP if pain continues.   Final Clinical Impression(s) / ED Diagnoses Final diagnoses:  None   Right otalgia  Rx / DC Orders ED Discharge Orders     None        Dennie Bible 06/28/21 1704    Hayden Rasmussen, MD 06/29/21 1014

## 2021-06-28 NOTE — ED Triage Notes (Signed)
Pt presents with Right ear pain starting today. Also reports some drainage

## 2021-06-28 NOTE — ED Notes (Signed)
RN provided AVS using Teachback Method. Patient verbalizes understanding of Discharge Instructions. Opportunity for Questioning and Answers were provided by RN. Patient Discharged from ED. Patient was ambulatory off of unit.

## 2021-07-20 ENCOUNTER — Emergency Department (HOSPITAL_BASED_OUTPATIENT_CLINIC_OR_DEPARTMENT_OTHER)
Admission: EM | Admit: 2021-07-20 | Discharge: 2021-07-20 | Disposition: A | Discharge status: Home / Self Care | Payer: Managed Care, Other (non HMO) | Attending: Emergency Medicine | Admitting: Emergency Medicine

## 2021-07-20 ENCOUNTER — Emergency Department (HOSPITAL_BASED_OUTPATIENT_CLINIC_OR_DEPARTMENT_OTHER): Payer: Managed Care, Other (non HMO)

## 2021-07-20 ENCOUNTER — Encounter (HOSPITAL_BASED_OUTPATIENT_CLINIC_OR_DEPARTMENT_OTHER): Payer: Self-pay

## 2021-07-20 DIAGNOSIS — Z79899 Other long term (current) drug therapy: Secondary | ICD-10-CM | POA: Diagnosis not present

## 2021-07-20 DIAGNOSIS — J45909 Unspecified asthma, uncomplicated: Secondary | ICD-10-CM | POA: Diagnosis not present

## 2021-07-20 DIAGNOSIS — K429 Umbilical hernia without obstruction or gangrene: Secondary | ICD-10-CM | POA: Diagnosis not present

## 2021-07-20 DIAGNOSIS — I10 Essential (primary) hypertension: Secondary | ICD-10-CM | POA: Diagnosis not present

## 2021-07-20 DIAGNOSIS — R1033 Periumbilical pain: Secondary | ICD-10-CM | POA: Diagnosis present

## 2021-07-20 HISTORY — PX: TOE SURGERY: SHX1073

## 2021-07-20 LAB — COMPREHENSIVE METABOLIC PANEL
ALT: 12 U/L (ref 0–44)
AST: 14 U/L — ABNORMAL LOW (ref 15–41)
Albumin: 3.7 g/dL (ref 3.5–5.0)
Alkaline Phosphatase: 52 U/L (ref 38–126)
Anion gap: 5 (ref 5–15)
BUN: 12 mg/dL (ref 6–20)
CO2: 27 mmol/L (ref 22–32)
Calcium: 8.4 mg/dL — ABNORMAL LOW (ref 8.9–10.3)
Chloride: 107 mmol/L (ref 98–111)
Creatinine, Ser: 0.61 mg/dL (ref 0.44–1.00)
GFR, Estimated: >60 mL/min (ref >60)
Glucose, Bld: 75 mg/dL (ref 70–99)
Potassium: 3.9 mmol/L (ref 3.5–5.1)
Sodium: 139 mmol/L (ref 135–145)
Total Bilirubin: 0.2 mg/dL — ABNORMAL LOW (ref 0.3–1.2)
Total Protein: 6.5 g/dL (ref 6.5–8.1)

## 2021-07-20 LAB — URINALYSIS, ROUTINE W REFLEX MICROSCOPIC
Bilirubin Urine: NEGATIVE
Glucose, UA: NEGATIVE mg/dL
Hgb urine dipstick: NEGATIVE
Ketones, ur: NEGATIVE mg/dL
Leukocytes,Ua: NEGATIVE
Nitrite: NEGATIVE
Specific Gravity, Urine: 1.031 — ABNORMAL HIGH (ref 1.005–1.030)
pH: 5.5 (ref 5.0–8.0)

## 2021-07-20 LAB — CBC
HCT: 34 % — ABNORMAL LOW (ref 36.0–46.0)
Hemoglobin: 10 g/dL — ABNORMAL LOW (ref 12.0–15.0)
MCH: 22 pg — ABNORMAL LOW (ref 26.0–34.0)
MCHC: 29.4 g/dL — ABNORMAL LOW (ref 30.0–36.0)
MCV: 74.7 fL — ABNORMAL LOW (ref 80.0–100.0)
Platelets: 467 10*3/uL — ABNORMAL HIGH (ref 150–400)
RBC: 4.55 MIL/uL (ref 3.87–5.11)
RDW: 17.2 % — ABNORMAL HIGH (ref 11.5–15.5)
WBC: 8.5 10*3/uL (ref 4.0–10.5)
nRBC: 0 % (ref 0.0–0.2)

## 2021-07-20 LAB — LIPASE, BLOOD: Lipase: 24 U/L (ref 11–51)

## 2021-07-20 LAB — PREGNANCY, URINE: Preg Test, Ur: NEGATIVE

## 2021-07-20 MED ORDER — HYDROCODONE-ACETAMINOPHEN 5-325 MG PO TABS
1.0000 | ORAL_TABLET | Freq: Four times a day (QID) | ORAL | 0 refills | Status: DC | PRN
Start: 2021-07-20 — End: 2021-07-29

## 2021-07-20 MED ORDER — IOHEXOL 300 MG/ML  SOLN
100.0000 mL | Freq: Once | INTRAMUSCULAR | Status: AC | PRN
  Administered 2021-07-20: 100 mL via INTRAVENOUS

## 2021-07-20 NOTE — ED Triage Notes (Signed)
Pt c/o periumbilical abdominal pain that started last night. Denies N/V/D. LMP within two weeks.

## 2021-07-20 NOTE — Discharge Instructions (Addendum)
Recommend taking Motrin or Naprosyn on a regular basis.  CT scan and shows evidence of an umbilical hernia that has just fat contained in it.  That fat is inflamed.  The anti-inflammatory medicine will help settle that down.  Also short course of hydrocodone prescription provided that you can pick up at the [facility].  As needed for additional pain.  Make an appointment to follow-up with general surgery for consideration for repair of this hernia.  Return for fevers return for persistent vomiting.

## 2021-07-20 NOTE — ED Provider Notes (Signed)
[facility] Provider Note   CSN: [id number] Arrival date & time: 07/20/21  1337     History  Chief Complaint  Patient presents with   Abdominal Pain    Sally Taylor is a 31 y.o. female.  Patient with onset of an periumbilical umbilical area pain that started last night been constant since it started.  No nausea vomiting or diarrhea.  Patient's had pain in this area on and off for the past 9 months.    Past medical history significant for hypertension asthma and pelvic pain affecting pregnancy in second trimester.  Patient is not currently pregnant.  This was back in 2021.  Patient surgical history is significant for cesarean section in October 2021 laparoscopic appendectomy in September 2018.      Home Medications Prior to Admission medications   Medication Sig Start Date End Date Taking? Authorizing Provider  acetaminophen (TYLENOL) 325 MG tablet Take 3 tablets (975 mg total) by mouth every 6 (six) hours as needed for mild pain, moderate pain or headache. Patient not taking: No sig reported 12/25/19   Lajean Manes, CNM  amLODipine (NORVASC) 5 MG tablet Take 1 tablet (5 mg total) by mouth daily. 04/01/21   Shelly Bombard, MD  carvedilol (COREG) 12.5 MG tablet Take 1 tablet (12.5 mg total) by mouth 2 (two) times daily with a meal. 04/01/21   Shelly Bombard, MD  ferrous sulfate 325 (65 FE) MG tablet TAKE 1 TABLET (325 MG TOTAL) BY MOUTH EVERY OTHER DAY. 04/01/21 04/01/22  Shelly Bombard, MD  metroNIDAZOLE (FLAGYL) 500 MG tablet Take 1 tablet (500 mg total) by mouth 2 (two) times daily. 04/03/21   Shelly Bombard, MD  Prenatal Vit-Fe Fumarate-FA (PREPLUS) 27-1 MG TABS Take 1 tablet by mouth daily. 04/01/21   Shelly Bombard, MD      Allergies    Patient has no known allergies.    Review of Systems   Review of Systems  Constitutional:  Negative for chills and fever.  HENT:  Negative for ear pain and sore throat.   Eyes:   Negative for pain and visual disturbance.  Respiratory:  Negative for cough and shortness of breath.   Cardiovascular:  Negative for chest pain and palpitations.  Gastrointestinal:  Positive for abdominal pain. Negative for vomiting.  Genitourinary:  Negative for dysuria and hematuria.  Musculoskeletal:  Negative for arthralgias and back pain.  Skin:  Negative for color change and rash.  Neurological:  Negative for seizures and syncope.  All other systems reviewed and are negative.  Physical Exam Updated Vital Signs BP 119/78    Pulse (!) 52    Temp 99 F (37.2 C)    Resp 20    Ht 1.575 m (5\' 2" )    Wt 81.6 kg    SpO2 99%    BMI 32.92 kg/m  Physical Exam Vitals and nursing note reviewed.  Constitutional:      General: She is not in acute distress.    Appearance: Normal appearance. She is well-developed. She is not ill-appearing.  HENT:     Head: Normocephalic and atraumatic.  Eyes:     Conjunctiva/sclera: Conjunctivae normal.  Cardiovascular:     Rate and Rhythm: Normal rate and regular rhythm.     Heart sounds: No murmur heard. Pulmonary:     Effort: Pulmonary effort is normal. No respiratory distress.     Breath sounds: Normal breath sounds.  Abdominal:     Palpations: Abdomen  is soft. There is mass.     Tenderness: There is abdominal tenderness.     Comments: Patient just superior to the umbilicus with a tender mass measuring about 1 x 2 cm.  Musculoskeletal:        General: No swelling.     Cervical back: Neck supple.  Skin:    General: Skin is warm and dry.     Capillary Refill: Capillary refill takes less than 2 seconds.  Neurological:     General: No focal deficit present.     Mental Status: She is alert and oriented to person, place, and time.  Psychiatric:        Mood and Affect: Mood normal.    ED Results / Procedures / Treatments   Labs (all labs ordered are listed, but only abnormal results are displayed) Labs Reviewed  COMPREHENSIVE METABOLIC PANEL -  Abnormal; Notable for the following components:      Result Value   Calcium 8.4 (*)    AST 14 (*)    Total Bilirubin 0.2 (*)    All other components within normal limits  CBC - Abnormal; Notable for the following components:   Hemoglobin 10.0 (*)    HCT 34.0 (*)    MCV 74.7 (*)    MCH 22.0 (*)    MCHC 29.4 (*)    RDW 17.2 (*)    Platelets 467 (*)    All other components within normal limits  URINALYSIS, ROUTINE W REFLEX MICROSCOPIC - Abnormal; Notable for the following components:   Specific Gravity, Urine 1.031 (*)    Protein, ur TRACE (*)    All other components within normal limits  LIPASE, BLOOD  PREGNANCY, URINE    EKG None  Radiology CT Abdomen Pelvis W Contrast  Result Date: 07/20/2021 CLINICAL DATA:  31 year old female with history of periumbilical abdominal pain. EXAM: CT ABDOMEN AND PELVIS WITH CONTRAST TECHNIQUE: Multidetector CT imaging of the abdomen and pelvis was performed using the standard protocol following bolus administration of intravenous contrast. CONTRAST:  129mL OMNIPAQUE IOHEXOL 300 MG/ML  SOLN COMPARISON:  CT the abdomen and pelvis 04/01/2017. FINDINGS: Lower chest: Unremarkable. Hepatobiliary: No suspicious cystic or solid hepatic lesions. No intra or extrahepatic biliary ductal dilatation. Gallbladder is normal in appearance. Pancreas: No pancreatic mass. No pancreatic ductal dilatation. No pancreatic or peripancreatic fluid collections or inflammatory changes. Spleen: Unremarkable. Adrenals/Urinary Tract: Low-attenuation lesion in the medial aspect of the interpolar region of the left kidney, compatible with a simple cyst. Right kidney and bilateral adrenal glands are normal in appearance. No hydroureteronephrosis. Urinary bladder is normal in appearance. Stomach/Bowel: The appearance of the stomach is normal. No pathologic dilatation of small bowel or colon. Status post appendectomy. Vascular/Lymphatic: No atherosclerotic calcifications are noted in the  abdominal aorta or pelvic vasculature. Circumaortic left renal vein (normal anatomical variant) incidentally noted. No lymphadenopathy noted in the abdomen or pelvis. Reproductive: There are 2 lesions in the uterus, largest of which measures 4.2 x 4.2 x 3.4 cm (axial image 62 of series 2 and sagittal image 72 of series 6) with central low attenuation and some peripheral enhancement and calcification, likely to represent fibroids. Ovaries are unremarkable in appearance. Other: Large supraumbilical ventral hernia containing omental fat with extensive internal fat stranding. No significant volume of ascites. No pneumoperitoneum. Musculoskeletal: There are no aggressive appearing lytic or blastic lesions noted in the visualized portions of the skeleton. IMPRESSION: 1. Large supraumbilical ventral hernia containing omental fat with extensive fat stranding, which could suggest  ischemia in the entrapped tissues. No associated bowel incarceration or obstruction noted at this time. 2. Fibroid uterus. 3. Additional incidental findings, as above. Electronically Signed   By: Vinnie Langton M.D.   On: 07/20/2021 17:58    Procedures Procedures    Medications Ordered in ED Medications  iohexol (OMNIPAQUE) 300 MG/ML solution 100 mL (100 mLs Intravenous Contrast Given 07/20/21 1722)    ED Course/ Medical Decision Making/ A&P                           Medical Decision Making  Clinically patient's discomfort in the umbilical area is highly concerning for an umbilical hernia.  May just be omental fat.  Patient certainly not acting as if there is a bowel obstruction no nausea or vomiting.  Will get CT scan abdomen to further evaluate.  Patient's labs here lipase is normal at 24 complete metabolic panel without any significant abnormalities liver function test are normal total bilirubin is not elevated renal function GFR greater than 60 is normal.  No leukocytosis on the CBC and hemoglobin is 10.  Platelets 467.   Pregnancy is negative.  CT scan of the abdomen was done for suspicion for an umbilical hernia.  It shows a large supraumbilical ventral hernia containing omental fat with extensive fat stranding which could suggest ischemia the entrapped tissues but there is no associated bowel incarcerated or obstruction.  No evidence of any in incarcerated bowel.  Patient also has a fibroid uterus.  These findings are consistent when I thought we would find clinically.  It is good to know that no bowel involvement.  We will treat patient with anti-inflammatories pain medicine and follow-up with general surgery.     Final Clinical Impression(s) / ED Diagnoses Final diagnoses:  Umbilical hernia without obstruction and without gangrene    Rx / DC Orders ED Discharge Orders     None         Fredia Sorrow, MD 07/20/21 1820

## 2021-07-29 ENCOUNTER — Ambulatory Visit (INDEPENDENT_AMBULATORY_CARE_PROVIDER_SITE_OTHER): Payer: Managed Care, Other (non HMO) | Admitting: Internal Medicine

## 2021-07-29 ENCOUNTER — Other Ambulatory Visit: Payer: Self-pay

## 2021-07-29 ENCOUNTER — Encounter: Payer: Self-pay | Admitting: Internal Medicine

## 2021-07-29 VITALS — BP 150/98 | HR 60 | Temp 98.2°F | Resp 16 | Ht 62.0 in | Wt 185.0 lb

## 2021-07-29 DIAGNOSIS — A530 Latent syphilis, unspecified as early or late: Secondary | ICD-10-CM

## 2021-07-29 DIAGNOSIS — I10 Essential (primary) hypertension: Secondary | ICD-10-CM

## 2021-07-29 DIAGNOSIS — R001 Bradycardia, unspecified: Secondary | ICD-10-CM | POA: Diagnosis not present

## 2021-07-29 DIAGNOSIS — Z309 Encounter for contraceptive management, unspecified: Secondary | ICD-10-CM

## 2021-07-29 DIAGNOSIS — Z0001 Encounter for general adult medical examination with abnormal findings: Secondary | ICD-10-CM

## 2021-07-29 DIAGNOSIS — Q742 Other congenital malformations of lower limb(s), including pelvic girdle: Secondary | ICD-10-CM

## 2021-07-29 DIAGNOSIS — D500 Iron deficiency anemia secondary to blood loss (chronic): Secondary | ICD-10-CM

## 2021-07-29 LAB — URINALYSIS, ROUTINE W REFLEX MICROSCOPIC
Bilirubin Urine: NEGATIVE
Ketones, ur: NEGATIVE
Leukocytes,Ua: NEGATIVE
Nitrite: NEGATIVE
Specific Gravity, Urine: 1.02 (ref 1.000–1.030)
Total Protein, Urine: NEGATIVE
Urine Glucose: NEGATIVE
Urobilinogen, UA: 0.2 (ref 0.0–1.0)
pH: 6 (ref 5.0–8.0)

## 2021-07-29 LAB — TSH: TSH: 1.1 u[IU]/mL (ref 0.35–5.50)

## 2021-07-29 LAB — IBC + FERRITIN
Ferritin: 2.8 ng/mL — ABNORMAL LOW (ref 10.0–291.0)
Iron: 12 ug/dL — ABNORMAL LOW (ref 42–145)
Saturation Ratios: 2.8 % — ABNORMAL LOW (ref 20.0–50.0)
TIBC: 434 ug/dL (ref 250.0–450.0)
Transferrin: 310 mg/dL (ref 212.0–360.0)

## 2021-07-29 MED ORDER — TRIAMTERENE-HCTZ 37.5-25 MG PO CAPS: 1.0000 | ORAL_CAPSULE | Freq: Every day | ORAL | 0 refills | Status: DC

## 2021-07-29 NOTE — Progress Notes (Signed)
Subjective:  Patient ID: Sally Taylor, female    DOB: March 10, 1991  Age: 31 y.o. MRN: [id number]  CC: Annual Exam, Anemia, and Hypertension  This visit occurred during the SARS-CoV-2 public health emergency.  Safety protocols were in place, including screening questions prior to the visit, additional usage of staff PPE, and extensive cleaning of exam room while observing appropriate contact time as indicated for disinfecting solutions.    HPI Joda Cherie Laberth-Brown presents for a CPX and to establish.  She complains of a growth on her right 3 rd toe that has been enlarging over the last year. She has a hx of HTN but is not taking any antihypertensives. She is also not taking an iron supplement. She chews ice and complains of fatigue.  History Khady has a past medical history of Asthma, Headache, Hypertension, and Pelvic pain affecting pregnancy in second trimester, antepartum (02/07/2020).   She has a past surgical history that includes laparoscopic appendectomy (N/A, 04/01/2017) and Cesarean section (N/A, 05/14/2020).   Her family history includes Drug abuse in her mother; Hypertension in her mother.She reports that she has been smoking cigarettes. She has a 5.00 pack-year smoking history. She has never used smokeless tobacco. She reports that she does not currently use alcohol after a past usage of about 1.0 standard drink per week. She reports that she does not use drugs.  Outpatient Medications Prior to Visit  Medication Sig Dispense Refill   acetaminophen (TYLENOL) 325 MG tablet Take 3 tablets (975 mg total) by mouth every 6 (six) hours as needed for mild pain, moderate pain or headache.     etonogestrel (NEXPLANON) 68 MG IMPL implant 1 each (68 mg total) by Subdermal route. 1 each 0   amLODipine (NORVASC) 5 MG tablet Take 1 tablet (5 mg total) by mouth daily. 30 tablet 11   carvedilol (COREG) 12.5 MG tablet Take 1 tablet (12.5 mg total) by mouth 2 (two) times  daily with a meal. 60 tablet 11   ferrous sulfate 325 (65 FE) MG tablet TAKE 1 TABLET (325 MG TOTAL) BY MOUTH EVERY OTHER DAY. 15 tablet 1   HYDROcodone-acetaminophen (NORCO/VICODIN) 5-325 MG tablet Take 1 tablet by mouth every 6 (six) hours as needed for moderate pain. 12 tablet 0   metroNIDAZOLE (FLAGYL) 500 MG tablet Take 1 tablet (500 mg total) by mouth 2 (two) times daily. 14 tablet 2   Prenatal Vit-Fe Fumarate-FA (PREPLUS) 27-1 MG TABS Take 1 tablet by mouth daily. 30 tablet 13   No facility-administered medications prior to visit.    ROS Review of Systems  Constitutional:  Positive for fatigue. Negative for appetite change, diaphoresis and fever.  HENT: Negative.    Respiratory:  Negative for apnea, cough, shortness of breath and wheezing.   Cardiovascular:  Negative for chest pain, palpitations and leg swelling.  Gastrointestinal: Negative.  Negative for abdominal pain, constipation, diarrhea, nausea and vomiting.  Genitourinary:  Positive for menstrual problem. Negative for difficulty urinating and pelvic pain.       Long cycles  Musculoskeletal: Negative.  Negative for arthralgias and myalgias.  Skin:  Negative for color change, pallor and rash.  Neurological: Negative.  Negative for dizziness, weakness, light-headedness and headaches.  Hematological:  Negative for adenopathy. Does not bruise/bleed easily.  Psychiatric/Behavioral: Negative.     Objective:  BP (!) 150/98 (BP Location: Right Arm, Patient Position: Sitting, Cuff Size: Large)    Pulse 60    Temp 98.2 F (36.8 C) (Oral)    Resp  16    Ht 5\' 2"  (1.575 m)    Wt 185 lb (83.9 kg)    SpO2 99%    BMI 33.84 kg/m   Physical Exam Vitals reviewed.  HENT:     Nose: Nose normal.     Mouth/Throat:     Mouth: Mucous membranes are moist.  Eyes:     General: No scleral icterus.    Conjunctiva/sclera: Conjunctivae normal.  Cardiovascular:     Rate and Rhythm: Regular rhythm. Bradycardia present.     Heart sounds: No murmur  heard.    Comments: EKG- Sinus bradycardia with SA No LVH Pulmonary:     Effort: Pulmonary effort is normal.     Breath sounds: No stridor. No wheezing, rhonchi or rales.  Abdominal:     General: Abdomen is protuberant. Bowel sounds are normal. There is no distension.     Palpations: Abdomen is soft. There is no hepatomegaly, splenomegaly or mass.  Musculoskeletal:     Cervical back: Neck supple.     Right lower leg: No edema.     Left lower leg: No edema.  Lymphadenopathy:     Cervical: No cervical adenopathy.  Skin:    General: Skin is warm and dry.     Coloration: Skin is not pale.  Neurological:     General: No focal deficit present.     Mental Status: She is alert. Mental status is at baseline.  Psychiatric:        Mood and Affect: Mood normal.        Behavior: Behavior normal.        Thought Content: Thought content normal.     Lab Results  Component Value Date   WBC 8.5 07/20/2021   HGB 10.0 (L) 07/20/2021   HCT 34.0 (L) 07/20/2021   PLT 467 (H) 07/20/2021   GLUCOSE 75 07/20/2021   ALT 12 07/20/2021   AST 14 (L) 07/20/2021   NA 139 07/20/2021   K 3.9 07/20/2021   CL 107 07/20/2021   CREATININE 0.61 07/20/2021   BUN 12 07/20/2021   CO2 27 07/20/2021   TSH 1.10 07/29/2021   HGBA1C 5.2 11/24/2019       Assessment & Plan:   Tenae was seen today for annual exam, anemia and hypertension.  Diagnoses and all orders for this visit:  Primary hypertension- Will treat with dyazide. Will check labs to screen for secondary causes. -     TSH; Future -     Urinalysis, Routine w reflex microscopic; Future -     Aldosterone + renin activity w/ ratio; Future -     EKG 12-Lead -     triamterene-hydrochlorothiazide (DYAZIDE) 37.5-25 MG capsule; Take 1 each (1 capsule total) by mouth daily. -     Aldosterone + renin activity w/ ratio -     Urinalysis, Routine w reflex microscopic -     TSH  Iron deficiency anemia due to chronic blood loss- Will start iron  replacement therapy. -     IBC + Ferritin; Future -     IBC + Ferritin -     Ferric Maltol (ACCRUFER) 30 MG CAPS; Take 1 capsule by mouth in the morning and at bedtime.  Bradycardia- She is asx with this.  Positive RPR test- Most recent RPR was negative.  Toe anomaly -     Ambulatory referral to Podiatry  Encounter for contraceptive management, unspecified type   I have discontinued Phuong C. Laberth-Brown's carvedilol, amLODipine, PrePLUS, ferrous sulfate,  metroNIDAZOLE, and HYDROcodone-acetaminophen. I am also having her start on triamterene-hydrochlorothiazide and ACCRUFeR. Additionally, I am having her maintain her acetaminophen and etonogestrel.  Meds ordered this encounter  Medications   triamterene-hydrochlorothiazide (DYAZIDE) 37.5-25 MG capsule    Sig: Take 1 each (1 capsule total) by mouth daily.    Dispense:  90 capsule    Refill:  0   Ferric Maltol (ACCRUFER) 30 MG CAPS    Sig: Take 1 capsule by mouth in the morning and at bedtime.    Dispense:  180 capsule    Refill:  1     Follow-up: Return in about 3 months (around 10/27/2021).  Scarlette Calico, MD

## 2021-07-29 NOTE — Patient Instructions (Signed)

## 2021-07-30 ENCOUNTER — Encounter: Payer: Self-pay | Admitting: Internal Medicine

## 2021-07-30 DIAGNOSIS — Z309 Encounter for contraceptive management, unspecified: Secondary | ICD-10-CM | POA: Insufficient documentation

## 2021-07-30 DIAGNOSIS — Z0001 Encounter for general adult medical examination with abnormal findings: Secondary | ICD-10-CM | POA: Insufficient documentation

## 2021-07-30 MED ORDER — ACCRUFER 30 MG PO CAPS: 1.0000 | ORAL_CAPSULE | Freq: Two times a day (BID) | ORAL | 1 refills | Status: DC

## 2021-07-30 NOTE — Assessment & Plan Note (Signed)
Exam completed Labs reviewed She refused a flu vax Cancer screenings are UTD Pt ed material was given.

## 2021-08-04 LAB — ALDOSTERONE + RENIN ACTIVITY W/ RATIO
ALDO / PRA Ratio: 23.8 Ratio (ref 0.9–28.9)
Aldosterone: 5 ng/dL
Renin Activity: 0.21 ng/mL/h — ABNORMAL LOW (ref 0.25–5.82)

## 2021-08-05 ENCOUNTER — Ambulatory Visit (INDEPENDENT_AMBULATORY_CARE_PROVIDER_SITE_OTHER): Payer: Managed Care, Other (non HMO)

## 2021-08-05 ENCOUNTER — Encounter: Payer: Self-pay | Admitting: Podiatry

## 2021-08-05 ENCOUNTER — Other Ambulatory Visit: Payer: Self-pay

## 2021-08-05 ENCOUNTER — Ambulatory Visit: Payer: Medicaid Other | Admitting: Podiatry

## 2021-08-05 ENCOUNTER — Other Ambulatory Visit: Payer: Self-pay | Admitting: Podiatry

## 2021-08-05 DIAGNOSIS — D492 Neoplasm of unspecified behavior of bone, soft tissue, and skin: Secondary | ICD-10-CM

## 2021-08-05 DIAGNOSIS — M2041 Other hammer toe(s) (acquired), right foot: Secondary | ICD-10-CM

## 2021-08-05 NOTE — Progress Notes (Signed)
Subjective:  Patient ID: Sally Taylor, female    DOB: 26-Aug-1990,  MRN: [id number] HPI Chief Complaint  Patient presents with   Toe Pain    3rd toe right - growth coming from the base of the nail, had pedicure July 2021, remembers a knick on the skin that the next day appears to be a blood blister, area has continued to grow larger since   New Patient (Initial Visit)    31 y.o. female presents with the above complaint.   ROS: Denies fever chills nausea vomiting muscle aches pains calf pain back pain chest pain shortness of breath.  Past Medical History:  Diagnosis Date   Asthma    Headache    Hypertension    Pelvic pain affecting pregnancy in second trimester, antepartum 02/07/2020   Past Surgical History:  Procedure Laterality Date   CESAREAN SECTION N/A 05/14/2020   Procedure: CESAREAN SECTION;  Surgeon: Cherre Blanc, MD;  Location: [facility];  Service: Obstetrics;  Laterality: N/A;   LAPAROSCOPIC APPENDECTOMY N/A 04/01/2017   Procedure: APPENDECTOMY LAPAROSCOPIC;  Surgeon: Johnathan Hausen, MD;  Location: [facility];  Service: General;  Laterality: N/A;    Current Outpatient Medications:    acetaminophen (TYLENOL) 325 MG tablet, Take 3 tablets (975 mg total) by mouth every 6 (six) hours as needed for mild pain, moderate pain or headache., Disp: , Rfl:    etonogestrel (NEXPLANON) 68 MG IMPL implant, 1 each (68 mg total) by Subdermal route., Disp: 1 each, Rfl: 0   Ferric Maltol (ACCRUFER) 30 MG CAPS, Take 1 capsule by mouth in the morning and at bedtime., Disp: 180 capsule, Rfl: 1   triamterene-hydrochlorothiazide (DYAZIDE) 37.5-25 MG capsule, Take 1 each (1 capsule total) by mouth daily., Disp: 90 capsule, Rfl: 0  No Known Allergies Review of Systems Objective:  There were no vitals filed for this visit.  General: Well developed, nourished, in no acute distress, alert and oriented x3   Dermatological: Skin is warm, dry and supple bilateral. Nails x 10 are  well maintained; remaining integument appears unremarkable at this time. There are no open sores, no preulcerative lesions, no rash or signs of infection present.  Third digit right foot demonstrates flattening of the medial nail margin secondary to a fibrous tumor extending from the nailbed.  In total length it measures just over a centimeter in length but about 0.5 cm in diameter.  It is pedunculated approximately beneath the nail fold.  Vascular: Dorsalis Pedis artery and Posterior Tibial artery pedal pulses are 2/4 bilateral with immedate capillary fill time. Pedal hair growth present. No varicosities and no lower extremity edema present bilateral.   Neruologic: Grossly intact via light touch bilateral. Vibratory intact via tuning fork bilateral. Protective threshold with Semmes Wienstein monofilament intact to all pedal sites bilateral. Patellar and Achilles deep tendon reflexes 2+ bilateral. No Babinski or clonus noted bilateral.   Musculoskeletal: No gross boney pedal deformities bilateral. No pain, crepitus, or limitation noted with foot and ankle range of motion bilateral. Muscular strength 5/5 in all groups tested bilateral.  Gait: Unassisted, Nonantalgic.    Radiographs:  Radiographs taken today do not demonstrate any involvement of this lesion within the bone.  No osseous abnormalities identified.  No acute findings.  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  Excision of this lesion  could damage the nailbed permanently and she understands that she also understands that there is a good chance that this may grow back.  We will also send this for pathology.  We did discuss the possible postop complications which may include postop pain bleeding swelling infection recurrence need further surgery  overcorrection under correction loss of digit loss of limb loss of life.  Dispensed a Darco shoe provide her with information regarding the surgery center and anesthesia group follow-up with her in the near future for surgery.     Bracen Schum T. [place], [place]

## 2021-08-12 ENCOUNTER — Telehealth: Payer: Self-pay | Admitting: Urology

## 2021-08-12 NOTE — Telephone Encounter (Addendum)
DOS - 09/12/21  EXCISION TUMOR 3 RIGHT --- 28092  [employer] EFFECTIVE DATE- 09/17/17 MEDICAID WELL CARE - 01/18/20  [employer]   PLAN DEDUCTIBLE - $1,750.00 W/ $1,750.00 REMAINING OUT OF POCKET -  $ 7,000.00 W/  $10.80 MET COINSURANCE - 70% COPAY - $100.00   SPOKE WITH KAT WITH [employer] AND SHE STATED THAT FOR CPT CODE 67425 NO PRIOR AUTH IS REQUIRED. SHE DID STATE THAT PT DOES HAVE A LIMITED PLAN OF 3 SURGERIES PER YEAR AND AS OF TODAY SHE IS AT 0 SURGERIES FOR THE YEAR.  REF # KAT A. 08/12/21 @ 11:40AM  RECEIVED FAX FROM MEDICAID [facility] STATING THAT CPT CODE 52589 HAS BEEN APPROVED, AUTH #[id number], GOOD FROM 09/12/21 - 11/11/21.

## 2021-09-10 ENCOUNTER — Other Ambulatory Visit: Payer: Self-pay | Admitting: Podiatry

## 2021-09-10 MED ORDER — HYDROCODONE-ACETAMINOPHEN 10-325 MG PO TABS: 1.0000 | ORAL_TABLET | Freq: Four times a day (QID) | ORAL | 0 refills | Status: AC | PRN

## 2021-09-10 MED ORDER — CEPHALEXIN 500 MG PO CAPS: 500.0000 mg | ORAL_CAPSULE | Freq: Three times a day (TID) | ORAL | 0 refills | Status: DC

## 2021-09-10 MED ORDER — ONDANSETRON HCL 4 MG PO TABS: 4.0000 mg | ORAL_TABLET | Freq: Three times a day (TID) | ORAL | 0 refills | Status: DC | PRN

## 2021-09-12 DIAGNOSIS — D492 Neoplasm of unspecified behavior of bone, soft tissue, and skin: Secondary | ICD-10-CM

## 2021-09-12 DIAGNOSIS — D2371 Other benign neoplasm of skin of right lower limb, including hip: Secondary | ICD-10-CM | POA: Diagnosis not present

## 2021-09-15 ENCOUNTER — Telehealth: Payer: Self-pay | Admitting: Podiatry

## 2021-09-15 NOTE — Telephone Encounter (Signed)
Patient just had surgery with you and needs to know how long she will be out of work, or if she can return to work with restrictions. Sally Taylor works at [employer] in [employer]. Please advise

## 2021-09-16 DIAGNOSIS — M79676 Pain in unspecified toe(s): Secondary | ICD-10-CM

## 2021-09-18 ENCOUNTER — Ambulatory Visit (INDEPENDENT_AMBULATORY_CARE_PROVIDER_SITE_OTHER): Payer: Managed Care, Other (non HMO) | Admitting: Podiatry

## 2021-09-18 ENCOUNTER — Other Ambulatory Visit: Payer: Self-pay

## 2021-09-18 DIAGNOSIS — Z9889 Other specified postprocedural states: Secondary | ICD-10-CM

## 2021-09-18 DIAGNOSIS — D492 Neoplasm of unspecified behavior of bone, soft tissue, and skin: Secondary | ICD-10-CM

## 2021-09-18 NOTE — Progress Notes (Signed)
She presents today postop visit #1 date of surgery 09/12/2021 excision soft tissue tumor to the dorsal medial aspect of the third toe right foot.  She denies fever chills nausea vomiting muscle aches pains calf pain back pain chest pain shortness of breath.  States that she has been able to get along just fine utilizing the shoe. ? ?Objective: Vital signs are stable alert and oriented x3 dressed her dressing intact was removed demonstrates third toe sutures are intact minimal edema no cellulitis drainage or odor no open lesions or wounds are noted. ? ?Assessment: Well-healing surgical toe sutures are intact.  Third right. ? ?Plan: Redressed just the digit today I will allow her to go back to work next week as long as they have a position where she may sit and not reinjure this toe. ? ?I will follow-up with her in 1 week for suture removal. ?

## 2021-09-25 ENCOUNTER — Other Ambulatory Visit: Payer: Self-pay

## 2021-09-25 ENCOUNTER — Ambulatory Visit (INDEPENDENT_AMBULATORY_CARE_PROVIDER_SITE_OTHER): Payer: Managed Care, Other (non HMO) | Admitting: Podiatry

## 2021-09-25 DIAGNOSIS — D492 Neoplasm of unspecified behavior of bone, soft tissue, and skin: Secondary | ICD-10-CM

## 2021-09-25 DIAGNOSIS — Z9889 Other specified postprocedural states: Secondary | ICD-10-CM

## 2021-09-25 NOTE — Progress Notes (Signed)
She presents today status post removal of soft tissue tumor date of surgery 09/12/2021 third toe right foot.  She states is doing great and having no problems. ? ?Objective: Dressing was removed sutures were removed margins remain remain well coapted no signs of infection. ? ?Assessment: Well-healing surgical toe. ? ?Plan: Redressed today with a light dressing let her start getting this wet I like to follow-up with her in a couple of weeks just to make sure she is doing well. ?

## 2021-10-09 ENCOUNTER — Ambulatory Visit (INDEPENDENT_AMBULATORY_CARE_PROVIDER_SITE_OTHER): Payer: Managed Care, Other (non HMO) | Admitting: Podiatry

## 2021-10-09 ENCOUNTER — Other Ambulatory Visit: Payer: Self-pay

## 2021-10-09 DIAGNOSIS — D492 Neoplasm of unspecified behavior of bone, soft tissue, and skin: Secondary | ICD-10-CM

## 2021-10-09 DIAGNOSIS — Z9889 Other specified postprocedural states: Secondary | ICD-10-CM

## 2021-10-09 NOTE — Progress Notes (Signed)
She presents today for her third postop visit date of surgery 09/12/2021 excision soft tissue tumor to the third right toe she states that is doing just great did drop something from the countertop on it the other day which hurt considerably but she states that seems to be doing fine now. ? ?Objective: Vital signs are stable alert and oriented x3 there is no erythema just some postinflammatory hyperpigmentation there is some still dried skin where the stitches were.  But it really is looking good at this point and is nontender on palpation. ? ?Assessment: I like for her to follow-up with me in about a month to see how that margin of this nail is going to grow out and if there is anything that we can do to help with that.  Otherwise if is doing perfectly she will cancel the appointment. ?

## 2021-10-23 ENCOUNTER — Encounter: Payer: Managed Care, Other (non HMO) | Admitting: Podiatry

## 2021-10-28 ENCOUNTER — Other Ambulatory Visit: Payer: Self-pay | Admitting: Internal Medicine

## 2021-10-28 DIAGNOSIS — I10 Essential (primary) hypertension: Secondary | ICD-10-CM

## 2021-11-11 ENCOUNTER — Ambulatory Visit (INDEPENDENT_AMBULATORY_CARE_PROVIDER_SITE_OTHER): Payer: Managed Care, Other (non HMO) | Admitting: Podiatry

## 2021-11-11 DIAGNOSIS — D492 Neoplasm of unspecified behavior of bone, soft tissue, and skin: Secondary | ICD-10-CM

## 2021-11-11 NOTE — Progress Notes (Signed)
She presents today with her daughter for follow-up of her excision soft tissue tumor third digit right foot.  She states that is doing great has not grown back. ? ?Objective: Vital signs are stable alert oriented x3 there is no erythema edema cellulitis drainage or odor.  No regrowth identified. ? ?Assessment: Well-healing surgical toe third right. ? ?Plan: Follow-up with me with any recurrence. ?

## 2022-09-17 ENCOUNTER — Encounter: Payer: Managed Care, Other (non HMO) | Admitting: Internal Medicine

## 2022-10-20 ENCOUNTER — Encounter: Payer: Self-pay | Admitting: Internal Medicine

## 2022-10-20 ENCOUNTER — Ambulatory Visit: Payer: Managed Care, Other (non HMO) | Admitting: Internal Medicine

## 2022-10-20 VITALS — BP 144/76 | HR 76 | Temp 98.6°F | Resp 16 | Ht 62.0 in | Wt 172.0 lb

## 2022-10-20 DIAGNOSIS — Z0000 Encounter for general adult medical examination without abnormal findings: Secondary | ICD-10-CM

## 2022-10-20 DIAGNOSIS — A530 Latent syphilis, unspecified as early or late: Secondary | ICD-10-CM | POA: Diagnosis not present

## 2022-10-20 DIAGNOSIS — L2084 Intrinsic (allergic) eczema: Secondary | ICD-10-CM | POA: Diagnosis not present

## 2022-10-20 DIAGNOSIS — D500 Iron deficiency anemia secondary to blood loss (chronic): Secondary | ICD-10-CM | POA: Diagnosis not present

## 2022-10-20 DIAGNOSIS — I10 Essential (primary) hypertension: Secondary | ICD-10-CM

## 2022-10-20 DIAGNOSIS — G8929 Other chronic pain: Secondary | ICD-10-CM

## 2022-10-20 DIAGNOSIS — M545 Low back pain, unspecified: Secondary | ICD-10-CM

## 2022-10-20 DIAGNOSIS — Z0001 Encounter for general adult medical examination with abnormal findings: Secondary | ICD-10-CM

## 2022-10-20 DIAGNOSIS — N946 Dysmenorrhea, unspecified: Secondary | ICD-10-CM | POA: Insufficient documentation

## 2022-10-20 LAB — CBC WITH DIFFERENTIAL/PLATELET
Basophils Absolute: 0 10*3/uL (ref 0.0–0.1)
Basophils Relative: 0.5 % (ref 0.0–3.0)
Eosinophils Absolute: 0.1 10*3/uL (ref 0.0–0.7)
Eosinophils Relative: 1.4 % (ref 0.0–5.0)
HCT: 34.8 % — ABNORMAL LOW (ref 36.0–46.0)
Hemoglobin: 11.3 g/dL — ABNORMAL LOW (ref 12.0–15.0)
Lymphocytes Relative: 31.3 % (ref 12.0–46.0)
Lymphs Abs: 2.1 10*3/uL (ref 0.7–4.0)
MCHC: 32.6 g/dL (ref 30.0–36.0)
MCV: 78.2 fl (ref 78.0–100.0)
Monocytes Absolute: 0.4 10*3/uL (ref 0.1–1.0)
Monocytes Relative: 5.4 % (ref 3.0–12.0)
Neutro Abs: 4.1 10*3/uL (ref 1.4–7.7)
Neutrophils Relative %: 61.4 % (ref 43.0–77.0)
Platelets: 416 10*3/uL — ABNORMAL HIGH (ref 150.0–400.0)
RBC: 4.45 Mil/uL (ref 3.87–5.11)
RDW: 15.8 % — ABNORMAL HIGH (ref 11.5–15.5)
WBC: 6.7 10*3/uL (ref 4.0–10.5)

## 2022-10-20 LAB — IBC + FERRITIN
Ferritin: 6.2 ng/mL — ABNORMAL LOW (ref 10.0–291.0)
Iron: 29 ug/dL — ABNORMAL LOW (ref 42–145)
Saturation Ratios: 7.5 % — ABNORMAL LOW (ref 20.0–50.0)
TIBC: 387.8 ug/dL (ref 250.0–450.0)
Transferrin: 277 mg/dL (ref 212.0–360.0)

## 2022-10-20 LAB — HEPATIC FUNCTION PANEL
ALT: 18 U/L (ref 0–35)
AST: 14 U/L (ref 0–37)
Albumin: 3.9 g/dL (ref 3.5–5.2)
Alkaline Phosphatase: 50 U/L (ref 39–117)
Bilirubin, Direct: 0.1 mg/dL (ref 0.0–0.3)
Total Bilirubin: 0.3 mg/dL (ref 0.2–1.2)
Total Protein: 6.7 g/dL (ref 6.0–8.3)

## 2022-10-20 LAB — BASIC METABOLIC PANEL
BUN: 12 mg/dL (ref 6–23)
CO2: 27 mEq/L (ref 19–32)
Calcium: 8.9 mg/dL (ref 8.4–10.5)
Chloride: 106 mEq/L (ref 96–112)
Creatinine, Ser: 0.67 mg/dL (ref 0.40–1.20)
GFR: 116.17 mL/min (ref >60.00)
Glucose, Bld: 114 mg/dL — ABNORMAL HIGH (ref 70–99)
Potassium: 3.7 mEq/L (ref 3.5–5.1)
Sodium: 138 mEq/L (ref 135–145)

## 2022-10-20 LAB — TSH: TSH: 0.54 u[IU]/mL (ref 0.35–5.50)

## 2022-10-20 MED ORDER — NAPROXEN 375 MG PO TBEC: 1.0000 | DELAYED_RELEASE_TABLET | Freq: Two times a day (BID) | ORAL | 0 refills | Status: DC

## 2022-10-20 MED ORDER — FLUOCINONIDE EMULSIFIED BASE 0.05 % EX CREA: 1.0000 | TOPICAL_CREAM | Freq: Two times a day (BID) | CUTANEOUS | 1 refills | Status: DC

## 2022-10-20 MED ORDER — INDAPAMIDE 1.25 MG PO TABS: 1.2500 mg | ORAL_TABLET | Freq: Every day | ORAL | 0 refills | Status: DC

## 2022-10-20 MED ORDER — ACCRUFER 30 MG PO CAPS: 1.0000 | ORAL_CAPSULE | Freq: Two times a day (BID) | ORAL | 1 refills | Status: DC

## 2022-10-20 NOTE — Progress Notes (Signed)
Subjective:  Patient ID: Sally Taylor, female    DOB: [birth year]  Age: 32 y.o. MRN: [id number]  CC: Annual Exam, Hypertension, Rash, and Anemia   HPI [name] presents for a CPX and f/up --  She is active and denies chest pain, diaphoresis, or edema.  She is concerned that her blood pressure is not adequately well-controlled.  She complains of pelvic and low back pain around the time of her menstrual cycle.  Outpatient Medications Prior to Visit  Medication Sig Dispense Refill   etonogestrel (NEXPLANON) 68 MG IMPL implant 1 each (68 mg total) by Subdermal route. 1 each 0   ondansetron (ZOFRAN) 4 MG tablet Take 1 tablet (4 mg total) by mouth every 8 (eight) hours as needed. (Patient not taking: Reported on 10/20/2022) 20 tablet 0   acetaminophen (TYLENOL) 325 MG tablet Take 3 tablets (975 mg total) by mouth every 6 (six) hours as needed for mild pain, moderate pain or headache. (Patient not taking: Reported on 10/20/2022)     cephALEXin (KEFLEX) 500 MG capsule Take 1 capsule (500 mg total) by mouth 3 (three) times daily. (Patient not taking: Reported on 10/20/2022) 30 capsule 0   Ferric Maltol (ACCRUFER) 30 MG CAPS Take 1 capsule by mouth in the morning and at bedtime. (Patient not taking: Reported on 10/20/2022) 180 capsule 1   triamterene-hydrochlorothiazide (DYAZIDE) 37.5-25 MG capsule TAKE 1 EACH (1 CAPSULE TOTAL) BY MOUTH DAILY. (Patient not taking: Reported on 10/20/2022) 90 capsule 0   No facility-administered medications prior to visit.    ROS Review of Systems  Constitutional:  Positive for fatigue. Negative for appetite change, diaphoresis and unexpected weight change.  HENT: Negative.    Respiratory:  Positive for shortness of breath. Negative for cough, chest tightness and wheezing.   Cardiovascular:  Negative for chest pain, palpitations and leg swelling.  Gastrointestinal:  Negative for abdominal pain, constipation, diarrhea, nausea and  vomiting.  Genitourinary:  Positive for menstrual problem.  Musculoskeletal:  Positive for back pain. Negative for myalgias and neck pain.  Skin:  Positive for color change and rash.       Itchy rash over left trapezius for one year  Neurological:  Positive for headaches. Negative for dizziness and weakness.  Hematological:  Negative for adenopathy. Does not bruise/bleed easily.  Psychiatric/Behavioral: Negative.      Objective:  BP (!) 144/76 (BP Location: Right Arm, Patient Position: Sitting, Cuff Size: Normal)   Pulse 76   Temp 98.6 F (37 C) (Oral)   Resp 16   Ht 5\' 2"  (1.575 m)   Wt 172 lb (78 kg)   LMP 10/09/2022 (Exact Date) Comment: implant  SpO2 99%   BMI 31.46 kg/m   BP Readings from Last 3 Encounters:  10/20/22 (!) 144/76  07/29/21 (!) 150/98  07/20/21 115/67    Wt Readings from Last 3 Encounters:  10/20/22 172 lb (78 kg)  07/29/21 185 lb (83.9 kg)  07/20/21 180 lb (81.6 kg)    Physical Exam Vitals reviewed.  Constitutional:      Appearance: Normal appearance. She is not ill-appearing.  HENT:     Nose: Nose normal.     Mouth/Throat:     Mouth: Mucous membranes are moist.  Eyes:     General: No scleral icterus.    Conjunctiva/sclera: Conjunctivae normal.  Cardiovascular:     Rate and Rhythm: Normal rate and regular rhythm.     Heart sounds: No murmur heard. Pulmonary:     Effort:  Pulmonary effort is normal.     Breath sounds: No stridor. No wheezing, rhonchi or rales.  Abdominal:     General: Abdomen is protuberant. Bowel sounds are normal. There is no distension.     Palpations: Abdomen is soft. There is no hepatomegaly, splenomegaly or mass.     Tenderness: There is no abdominal tenderness.  Musculoskeletal:        General: Normal range of motion.     Cervical back: Neck supple.     Right lower leg: No edema.     Left lower leg: No edema.  Lymphadenopathy:     Cervical: No cervical adenopathy.  Skin:    Findings: Rash present. No lesion.  Rash is macular, papular and scaling.  Neurological:     General: No focal deficit present.     Mental Status: She is alert and oriented to person, place, and time.     Lab Results  Component Value Date   WBC 6.7 10/20/2022   HGB 11.3 (L) 10/20/2022   HCT 34.8 (L) 10/20/2022   PLT 416.0 (H) 10/20/2022   GLUCOSE 114 (H) 10/20/2022   ALT 18 10/20/2022   AST 14 10/20/2022   NA 138 10/20/2022   K 3.7 10/20/2022   CL 106 10/20/2022   CREATININE 0.67 10/20/2022   BUN 12 10/20/2022   CO2 27 10/20/2022   TSH 0.54 10/20/2022   HGBA1C 5.2 11/24/2019    CT Abdomen Pelvis W Contrast  Result Date: 07/20/2021 CLINICAL DATA:  32 year old female with history of periumbilical abdominal pain. EXAM: CT ABDOMEN AND PELVIS WITH CONTRAST TECHNIQUE: Multidetector CT imaging of the abdomen and pelvis was performed using the standard protocol following bolus administration of intravenous contrast. CONTRAST:  OMNIPAQUE IOHEXOL 300 MG/ML  SOLN COMPARISON:  CT the abdomen and pelvis 04/01/2017. FINDINGS: Lower chest: Unremarkable. Hepatobiliary: No suspicious cystic or solid hepatic lesions. No intra or extrahepatic biliary ductal dilatation. Gallbladder is normal in appearance. Pancreas: No pancreatic mass. No pancreatic ductal dilatation. No pancreatic or peripancreatic fluid collections or inflammatory changes. Spleen: Unremarkable. Adrenals/Urinary Tract: Low-attenuation lesion in the medial aspect of the interpolar region of the left kidney, compatible with a simple cyst. Right kidney and bilateral adrenal glands are normal in appearance. No hydroureteronephrosis. Urinary bladder is normal in appearance. Stomach/Bowel: The appearance of the stomach is normal. No pathologic dilatation of small bowel or colon. Status post appendectomy. Vascular/Lymphatic: No atherosclerotic calcifications are noted in the abdominal aorta or pelvic vasculature. Circumaortic left renal vein (normal anatomical variant)  incidentally noted. No lymphadenopathy noted in the abdomen or pelvis. Reproductive: There are 2 lesions in the uterus, largest of which measures 4.2 x 4.2 x 3.4 cm (axial image 62 of series 2 and sagittal image 72 of series 6) with central low attenuation and some peripheral enhancement and calcification, likely to represent fibroids. Ovaries are unremarkable in appearance. Other: Large supraumbilical ventral hernia containing omental fat with extensive internal fat stranding. No significant volume of ascites. No pneumoperitoneum. Musculoskeletal: There are no aggressive appearing lytic or blastic lesions noted in the visualized portions of the skeleton. IMPRESSION: 1. Large supraumbilical ventral hernia containing omental fat with extensive fat stranding, which could suggest ischemia in the entrapped tissues. No associated bowel incarceration or obstruction noted at this time. 2. Fibroid uterus. 3. Additional incidental findings, as above. Electronically Signed   By: Trudie Reed M.D.   On: 07/20/2021 17:58    Assessment & Plan:   Iron deficiency anemia due to chronic blood  loss -     IBC + Ferritin; Future -     CBC with Differential/Platelet; Future -     ACCRUFeR; Take 1 capsule (30 mg total) by mouth in the morning and at bedtime.  Dispense: 180 capsule; Refill: 1  Positive RPR test- Her RPR is negative now. -     RPR; Future  Primary hypertension- Her blood pressure is not adequately well-controlled.  Will start a thiazide diuretic. -     Basic metabolic panel; Future -     CBC with Differential/Platelet; Future -     TSH; Future -     Hepatic function panel; Future -     Indapamide; Take 1 tablet (1.25 mg total) by mouth daily.  Dispense: 90 tablet; Refill: 0  Encounter for routine adult health examination with abnormal findings- Exam completed, labs reviewed, vaccines reviewed, cancer screenings are up-to-date, patient education was given. -     HIV Antibody (routine testing w  rflx)  Intrinsic eczema -     Fluocinonide Emulsified Base; Apply 1 Application topically 2 (two) times daily.  Dispense: 60 g; Refill: 1  Dysmenorrhea -     Naproxen; Take 1 tablet (375 mg total) by mouth 2 (two) times daily.  Dispense: 180 tablet; Refill: 0  Chronic bilateral low back pain without sciatica -     Naproxen; Take 1 tablet (375 mg total) by mouth 2 (two) times daily.  Dispense: 180 tablet; Refill: 0     Follow-up: Return in about 6 months (around 04/21/2023).  Sanda Linger, MD

## 2022-10-20 NOTE — Patient Instructions (Signed)

## 2022-10-21 LAB — RPR: RPR Ser Ql: NONREACTIVE

## 2022-10-21 LAB — HIV ANTIBODY (ROUTINE TESTING W REFLEX): HIV 1&2 Ab, 4th Generation: NONREACTIVE

## 2023-01-20 ENCOUNTER — Other Ambulatory Visit: Payer: Self-pay | Admitting: Internal Medicine

## 2023-01-20 DIAGNOSIS — I10 Essential (primary) hypertension: Secondary | ICD-10-CM

## 2023-02-17 ENCOUNTER — Ambulatory Visit (INDEPENDENT_AMBULATORY_CARE_PROVIDER_SITE_OTHER): Payer: Managed Care, Other (non HMO) | Admitting: Internal Medicine

## 2023-02-17 ENCOUNTER — Encounter: Payer: Self-pay | Admitting: Internal Medicine

## 2023-02-17 VITALS — BP 132/84 | HR 76 | Temp 98.4°F | Resp 16 | Ht 62.0 in | Wt 167.0 lb

## 2023-02-17 DIAGNOSIS — I10 Essential (primary) hypertension: Secondary | ICD-10-CM | POA: Diagnosis not present

## 2023-02-17 DIAGNOSIS — B353 Tinea pedis: Secondary | ICD-10-CM | POA: Diagnosis not present

## 2023-02-17 DIAGNOSIS — D500 Iron deficiency anemia secondary to blood loss (chronic): Secondary | ICD-10-CM | POA: Diagnosis not present

## 2023-02-17 LAB — BASIC METABOLIC PANEL
BUN: 11 mg/dL (ref 6–23)
CO2: 27 mEq/L (ref 19–32)
Calcium: 9.2 mg/dL (ref 8.4–10.5)
Chloride: 107 mEq/L (ref 96–112)
Creatinine, Ser: 0.7 mg/dL (ref 0.40–1.20)
GFR: 114.68 mL/min (ref >60.00)
Glucose, Bld: 58 mg/dL — ABNORMAL LOW (ref 70–99)
Potassium: 3.7 mEq/L (ref 3.5–5.1)
Sodium: 140 mEq/L (ref 135–145)

## 2023-02-17 LAB — IBC + FERRITIN
Ferritin: 8.3 ng/mL — ABNORMAL LOW (ref 10.0–291.0)
Iron: 46 ug/dL (ref 42–145)
Saturation Ratios: 11 % — ABNORMAL LOW (ref 20.0–50.0)
TIBC: 418.6 ug/dL (ref 250.0–450.0)
Transferrin: 299 mg/dL (ref 212.0–360.0)

## 2023-02-17 LAB — CBC WITH DIFFERENTIAL/PLATELET
Basophils Absolute: 0 10*3/uL (ref 0.0–0.1)
Basophils Relative: 0.7 % (ref 0.0–3.0)
Eosinophils Absolute: 0 10*3/uL (ref 0.0–0.7)
Eosinophils Relative: 0.7 % (ref 0.0–5.0)
HCT: 36.6 % (ref 36.0–46.0)
Hemoglobin: 11.8 g/dL — ABNORMAL LOW (ref 12.0–15.0)
Lymphocytes Relative: 29 % (ref 12.0–46.0)
Lymphs Abs: 1.7 10*3/uL (ref 0.7–4.0)
MCHC: 32.3 g/dL (ref 30.0–36.0)
MCV: 80.7 fl (ref 78.0–100.0)
Monocytes Absolute: 0.4 10*3/uL (ref 0.1–1.0)
Monocytes Relative: 7.5 % (ref 3.0–12.0)
Neutro Abs: 3.6 10*3/uL (ref 1.4–7.7)
Neutrophils Relative %: 62.1 % (ref 43.0–77.0)
Platelets: 398 10*3/uL (ref 150.0–400.0)
RBC: 4.54 Mil/uL (ref 3.87–5.11)
RDW: 16.3 % — ABNORMAL HIGH (ref 11.5–15.5)
WBC: 5.7 10*3/uL (ref 4.0–10.5)

## 2023-02-17 MED ORDER — FLUCONAZOLE 150 MG PO TABS
150.0000 mg | ORAL_TABLET | ORAL | 0 refills | Status: DC
Start: 2023-02-17 — End: 2023-10-06

## 2023-02-17 NOTE — Progress Notes (Signed)
Subjective:  Patient ID: Sally Taylor, female    DOB: 09/06/1990  Age: 32 y.o. MRN: [id number]  CC: Hypertension, Anemia, and Rash   HPI Dayra Cherie Laberth-Brown presents for f/up ---  Discussed the use of AI scribe software for clinical note transcription with the patient, who gave verbal consent to proceed.  History of Present Illness   The patient presents with a two-month history of foot discomfort characterized by peeling, itching, and burning sensations. The symptoms have been progressively worsening, reaching a level of severity that the patient describes as "very, very, very, very, very irritating and very annoying." The patient has not sought any over-the-counter treatments for these symptoms.  In addition to the primary complaint, the patient reports irregular menstrual cycles despite being on Nexplanon. She experienced a menstrual cycle two weeks prior to the consultation, followed by a half-week of amenorrhea, and then the resumption of menstruation. The duration of the menstrual bleeding is approximately one week. The patient denies any symptoms of low iron or high blood pressure, such as dizziness, lightheadedness, fatigue, or shortness of breath. There have been no changes in medication and no reported side effects.       Outpatient Medications Prior to Visit  Medication Sig Dispense Refill   etonogestrel (NEXPLANON) 68 MG IMPL implant 1 each (68 mg total) by Subdermal route. 1 each 0   Ferric Maltol (ACCRUFER) 30 MG CAPS Take 1 capsule (30 mg total) by mouth in the morning and at bedtime. 180 capsule 1   fluocinonide-emollient (LIDEX-E) 0.05 % cream Apply 1 Application topically 2 (two) times daily. 60 g 1   indapamide (LOZOL) 1.25 MG tablet TAKE 1 TABLET BY MOUTH DAILY. 90 tablet 0   Naproxen 375 MG TBEC Take 1 tablet (375 mg total) by mouth 2 (two) times daily. 180 tablet 0   ondansetron (ZOFRAN) 4 MG tablet Take 1 tablet (4 mg total) by  mouth every 8 (eight) hours as needed. 20 tablet 0   No facility-administered medications prior to visit.    ROS Review of Systems  Constitutional: Negative.  Negative for diaphoresis and fatigue.  HENT: Negative.    Eyes: Negative.   Respiratory:  Negative for cough, chest tightness, shortness of breath and wheezing.   Cardiovascular:  Negative for chest pain and palpitations.  Gastrointestinal:  Negative for abdominal pain, blood in stool, constipation, diarrhea, nausea and vomiting.  Endocrine: Negative.   Genitourinary: Negative.  Negative for difficulty urinating.  Musculoskeletal: Negative.  Negative for arthralgias, joint swelling and myalgias.  Skin:  Positive for rash.  Neurological: Negative.  Negative for dizziness and weakness.  Hematological:  Negative for adenopathy. Does not bruise/bleed easily.  Psychiatric/Behavioral: Negative.      Objective:  BP 132/84 (BP Location: Left Arm, Patient Position: Sitting, Cuff Size: Large)   Pulse 76   Temp 98.4 F (36.9 C) (Oral)   Resp 16   Ht 5\' 2"  (1.575 m)   Wt 167 lb (75.8 kg)   LMP 02/16/2023 (Approximate)   SpO2 98%   BMI 30.54 kg/m   BP Readings from Last 3 Encounters:  02/17/23 132/84  10/20/22 (!) 144/76  07/29/21 (!) 150/98    Wt Readings from Last 3 Encounters:  02/17/23 167 lb (75.8 kg)  10/20/22 172 lb (78 kg)  07/29/21 185 lb (83.9 kg)    Physical Exam Vitals reviewed.  HENT:     Mouth/Throat:     Mouth: Mucous membranes are moist.  Eyes:     General: No scleral icterus.    Conjunctiva/sclera: Conjunctivae normal.  Cardiovascular:     Rate and Rhythm: Normal rate and regular rhythm.     Heart sounds: No murmur heard. Pulmonary:     Effort: Pulmonary effort is normal.     Breath sounds: No stridor. No wheezing, rhonchi or rales.  Abdominal:     General: Abdomen is protuberant. Bowel sounds are normal. There is no distension.     Palpations: Abdomen is soft. There is no fluid wave,  hepatomegaly, splenomegaly or mass.     Tenderness: There is no abdominal tenderness.  Musculoskeletal:        General: Normal range of motion.     Cervical back: Neck supple.  Lymphadenopathy:     Cervical: No cervical adenopathy.  Skin:    Findings: Rash present.  Neurological:     General: No focal deficit present.     Mental Status: She is alert.  Psychiatric:        Mood and Affect: Mood normal.        Behavior: Behavior normal.     Lab Results  Component Value Date   WBC 5.7 02/17/2023   HGB 11.8 (L) 02/17/2023   HCT 36.6 02/17/2023   PLT 398.0 02/17/2023   GLUCOSE 58 (L) 02/17/2023   ALT 18 10/20/2022   AST 14 10/20/2022   NA 140 02/17/2023   K 3.7 02/17/2023   CL 107 02/17/2023   CREATININE 0.70 02/17/2023   BUN 11 02/17/2023   CO2 27 02/17/2023   TSH 0.54 10/20/2022   HGBA1C 5.2 11/24/2019    CT Abdomen Pelvis W Contrast  Result Date: 07/20/2021 CLINICAL DATA:  32 year old female with history of periumbilical abdominal pain. EXAM: CT ABDOMEN AND PELVIS WITH CONTRAST TECHNIQUE: Multidetector CT imaging of the abdomen and pelvis was performed using the standard protocol following bolus administration of intravenous contrast. CONTRAST:  OMNIPAQUE IOHEXOL 300 MG/ML  SOLN COMPARISON:  CT the abdomen and pelvis 04/01/2017. FINDINGS: Lower chest: Unremarkable. Hepatobiliary: No suspicious cystic or solid hepatic lesions. No intra or extrahepatic biliary ductal dilatation. Gallbladder is normal in appearance. Pancreas: No pancreatic mass. No pancreatic ductal dilatation. No pancreatic or peripancreatic fluid collections or inflammatory changes. Spleen: Unremarkable. Adrenals/Urinary Tract: Low-attenuation lesion in the medial aspect of the interpolar region of the left kidney, compatible with a simple cyst. Right kidney and bilateral adrenal glands are normal in appearance. No hydroureteronephrosis. Urinary bladder is normal in appearance. Stomach/Bowel: The appearance of  the stomach is normal. No pathologic dilatation of small bowel or colon. Status post appendectomy. Vascular/Lymphatic: No atherosclerotic calcifications are noted in the abdominal aorta or pelvic vasculature. Circumaortic left renal vein (normal anatomical variant) incidentally noted. No lymphadenopathy noted in the abdomen or pelvis. Reproductive: There are 2 lesions in the uterus, largest of which measures 4.2 x 4.2 x 3.4 cm (axial image 62 of series 2 and sagittal image 72 of series 6) with central low attenuation and some peripheral enhancement and calcification, likely to represent fibroids. Ovaries are unremarkable in appearance. Other: Large supraumbilical ventral hernia containing omental fat with extensive internal fat stranding. No significant volume of ascites. No pneumoperitoneum. Musculoskeletal: There are no aggressive appearing lytic or blastic lesions noted in the visualized portions of the skeleton. IMPRESSION: 1. Large supraumbilical ventral hernia containing omental fat with extensive fat stranding, which could suggest ischemia in the entrapped tissues. No associated bowel incarceration or obstruction noted at this time. 2. Fibroid uterus.  3. Additional incidental findings, as above. Electronically Signed   By: Trudie Reed M.D.   On: 07/20/2021 17:58    Assessment & Plan:   Tinea pedis of both feet -     Fluconazole; Take 1 tablet (150 mg total) by mouth once a week.  Dispense: 4 tablet; Refill: 0  Iron deficiency anemia due to chronic blood loss - H/H have improved. -     IBC + Ferritin; Future -     CBC with Differential/Platelet; Future  Primary hypertension- Her BP is well controlled. -     Basic metabolic panel; Future -     CBC with Differential/Platelet; Future     Follow-up: Return in about 6 months (around 08/20/2023).  Sanda Linger, MD

## 2023-02-17 NOTE — Patient Instructions (Signed)
Athlete's Foot  Athlete's foot (tinea pedis) is a fungal infection of the skin on your feet. It often occurs on the skin that is between or underneath your toes. It can also occur on the soles of your feet. Symptoms include itchy or white and flaky areas on the skin. The infection can spread from person to person (is contagious). It can also spread when a person's bare feet come in contact with the fungus on shower floors or on items such as shoes. Follow these instructions at home: Medicines Apply or take over-the-counter and prescription medicines only as told by your doctor. Apply your antifungal medicine as told by your doctor. Do not stop using it even if your feet start to get better. Foot care Do not scratch your feet. Keep your feet dry: Wear cotton or wool socks. Change your socks every day or if they become wet. Wear shoes that allow air to move around, such as sandals or canvas tennis shoes. Wash and dry your feet: Every day or as told by your doctor. After exercising. Including the area between your toes. General instructions Do not share any of these items that touch your feet: Towels. Shoes. Nail clippers. Other personal items. Protect your feet by wearing sandals in wet areas, such as locker rooms and shared showers. Keep all follow-up visits. If you have diabetes, keep your blood sugar under control. Contact a doctor if: You have a fever. You have swelling, pain, warmth, or redness in your foot. Your feet are not getting better with treatment. Your symptoms get worse. You have new symptoms. You have very bad pain. Summary Athlete's foot is a fungal infection of the skin on your feet. This condition is caused by a fungus that grows in warm, moist places. Symptoms include itchy or white and flaky areas on the skin. Apply your antifungal medicine as told by your doctor. Keep your feet clean and dry. This information is not intended to replace advice given to you by  your health care provider. Make sure you discuss any questions you have with your health care provider. Document Revised: 10/27/2020 Document Reviewed: 10/27/2020 Elsevier Patient Education  2024 [employer].

## 2023-03-10 ENCOUNTER — Ambulatory Visit (INDEPENDENT_AMBULATORY_CARE_PROVIDER_SITE_OTHER): Payer: Managed Care, Other (non HMO) | Admitting: Obstetrics and Gynecology

## 2023-03-10 ENCOUNTER — Other Ambulatory Visit (HOSPITAL_COMMUNITY)
Admission: RE | Admit: 2023-03-10 | Discharge: 2023-03-10 | Disposition: A | Discharge status: Home / Self Care | Payer: Managed Care, Other (non HMO) | Source: Ambulatory Visit | Attending: Obstetrics and Gynecology | Admitting: Obstetrics and Gynecology

## 2023-03-10 ENCOUNTER — Encounter: Payer: Self-pay | Admitting: Obstetrics and Gynecology

## 2023-03-10 VITALS — BP 145/82 | HR 72 | Ht 62.0 in | Wt 166.2 lb

## 2023-03-10 DIAGNOSIS — N760 Acute vaginitis: Secondary | ICD-10-CM

## 2023-03-10 DIAGNOSIS — B9689 Other specified bacterial agents as the cause of diseases classified elsewhere: Secondary | ICD-10-CM

## 2023-03-10 DIAGNOSIS — Z113 Encounter for screening for infections with a predominantly sexual mode of transmission: Secondary | ICD-10-CM

## 2023-03-10 DIAGNOSIS — Z01419 Encounter for gynecological examination (general) (routine) without abnormal findings: Secondary | ICD-10-CM

## 2023-03-10 NOTE — Progress Notes (Signed)
ANNUAL EXAM Patient name: Sally Taylor MRN [id number]  Date of birth: Sep 12, 1990 Chief Complaint:   Gynecologic Exam  History of Present Illness:   Sally Taylor is a 32 y.o. [phone]  female being seen today for a routine annual exam.  Current complaints: no complaints, irregular period with nexplanon in place  Patient's last menstrual period was 02/16/2023 (approximate).   The pregnancy intention screening data noted above was reviewed. Potential methods of contraception were discussed. The patient elected to proceed with Nexplanon in place   Last pap 2022. Results were:  negative . H/O abnormal pap: no Last mammogram: n/a. Results were: N/A. Family h/o breast cancer: no Last colonoscopy: n/a. Results were: N/A. Family h/o colorectal cancer: no     10/20/2022    1:04 PM 07/29/2021    1:03 PM 08/28/2019    2:51 PM  Depression screen PHQ 2/9  Decreased Interest 0 0 0  Down, Depressed, Hopeless 0 0 0  PHQ - 2 Score 0 0 0  Altered sleeping   0  Tired, decreased energy   0  Change in appetite   0  Feeling bad or failure about yourself    0  Trouble concentrating   0  Moving slowly or fidgety/restless   0  Suicidal thoughts   0  PHQ-9 Score   0  Difficult doing work/chores   Not difficult at all         No data to display           Review of Systems:   Pertinent items are noted in HPI Denies any headaches, blurred vision, fatigue, shortness of breath, chest pain, abdominal pain, abnormal vaginal discharge/itching/odor/irritation, problems with periods, bowel movements, urination, or intercourse unless otherwise stated above. Pertinent History Reviewed:  Reviewed past medical,surgical, social and family history.  Reviewed problem list, medications and allergies. Physical Assessment:   Vitals:   03/10/23 1431  BP: (!) 145/82  Pulse: 72  Weight: 166 lb 3.2 oz (75.4 kg)  Height: 5\' 2"  (1.575 m)  Body mass index is 30.4 kg/m.         Physical Examination:   General appearance - well appearing, and in no distress  Mental status - alert, oriented   Psych:  She has a normal mood and affect  Skin - warm and dry, normal color, nexplanon in place left arm  Chest - effort normal, all lung fields clear to auscultation bilaterally  Heart - normal rate and regular rhythm  Neck:  midline trachea, no thyromegaly   Breasts - breasts appear normal, no suspicious masses, no skin or nipple changes or  axillary nodes  Abdomen - soft,  Pelvic - deferred  Extremities:  No swelling or varicosities noted  Chaperone present for exam  No results found for this or any previous visit (from the past 24 hour(s)).  Assessment & Plan:  1. Well woman exam Discussed need for nexplanon removal/reinsertion in December  2. Screen for STD (sexually transmitted disease) CV swab and blood work  - Hepatitis B Surface AntiGEN - Hepatitis C Antibody - RPR - HIV antibody (with reflex) - Cervicovaginal ancillary only   Labs/procedures today:   Mammogram: @ 32yo, or sooner if problems Colonoscopy: @ 32yo, or sooner if problems  Orders Placed This Encounter  Procedures   Hepatitis B Surface AntiGEN   Hepatitis C Antibody   RPR   HIV antibody (with reflex)    Follow-up: Return in about 4 months (around 07/10/2023), or  Nexplanon removal and reinsertion.  Albertine Grates, FNP

## 2023-03-10 NOTE — Progress Notes (Signed)
Pr presents for AEX.  Last PAP 2022 Requesting STD testing Nexplanon expires 06/2020 Pt reports irregular periods

## 2023-03-11 LAB — CERVICOVAGINAL ANCILLARY ONLY
Bacterial Vaginitis (gardnerella): POSITIVE — AB
Candida Glabrata: NEGATIVE
Candida Vaginitis: NEGATIVE
Chlamydia: NEGATIVE
Comment: NEGATIVE
Comment: NEGATIVE
Comment: NEGATIVE
Comment: NEGATIVE
Comment: NEGATIVE
Comment: NORMAL
Neisseria Gonorrhea: NEGATIVE
Trichomonas: NEGATIVE

## 2023-03-12 MED ORDER — METRONIDAZOLE 500 MG PO TABS
500.0000 mg | ORAL_TABLET | Freq: Two times a day (BID) | ORAL | 0 refills | Status: AC
Start: 2023-03-12 — End: 2023-03-19

## 2023-10-06 ENCOUNTER — Encounter: Payer: Self-pay | Admitting: Internal Medicine

## 2023-10-06 ENCOUNTER — Ambulatory Visit: Payer: Managed Care, Other (non HMO) | Admitting: Internal Medicine

## 2023-10-06 VITALS — BP 132/86 | HR 74 | Temp 97.3°F | Resp 16 | Ht 62.0 in | Wt 162.0 lb

## 2023-10-06 DIAGNOSIS — L2084 Intrinsic (allergic) eczema: Secondary | ICD-10-CM

## 2023-10-06 DIAGNOSIS — B353 Tinea pedis: Secondary | ICD-10-CM

## 2023-10-06 DIAGNOSIS — I10 Essential (primary) hypertension: Secondary | ICD-10-CM

## 2023-10-06 DIAGNOSIS — D75839 Thrombocytosis, unspecified: Secondary | ICD-10-CM | POA: Insufficient documentation

## 2023-10-06 DIAGNOSIS — D500 Iron deficiency anemia secondary to blood loss (chronic): Secondary | ICD-10-CM

## 2023-10-06 LAB — CBC WITH DIFFERENTIAL/PLATELET
Basophils Absolute: 0 10*3/uL (ref 0.0–0.1)
Basophils Relative: 0.5 % (ref 0.0–3.0)
Eosinophils Absolute: 0.1 10*3/uL (ref 0.0–0.7)
Eosinophils Relative: 1.7 % (ref 0.0–5.0)
HCT: 37.9 % (ref 36.0–46.0)
Hemoglobin: 12.7 g/dL (ref 12.0–15.0)
Lymphocytes Relative: 20 % (ref 12.0–46.0)
Lymphs Abs: 1.6 10*3/uL (ref 0.7–4.0)
MCHC: 33.5 g/dL (ref 30.0–36.0)
MCV: 81.3 fl (ref 78.0–100.0)
Monocytes Absolute: 0.6 10*3/uL (ref 0.1–1.0)
Monocytes Relative: 7.2 % (ref 3.0–12.0)
Neutro Abs: 5.8 10*3/uL (ref 1.4–7.7)
Neutrophils Relative %: 70.6 % (ref 43.0–77.0)
Platelets: 406 10*3/uL — ABNORMAL HIGH (ref 150.0–400.0)
RBC: 4.67 Mil/uL (ref 3.87–5.11)
RDW: 17.2 % — ABNORMAL HIGH (ref 11.5–15.5)
WBC: 8.2 10*3/uL (ref 4.0–10.5)

## 2023-10-06 LAB — IBC + FERRITIN
Ferritin: 6.3 ng/mL — ABNORMAL LOW (ref 10.0–291.0)
Iron: 51 ug/dL (ref 42–145)
Saturation Ratios: 12.7 % — ABNORMAL LOW (ref 20.0–50.0)
TIBC: 401.8 ug/dL (ref 250.0–450.0)
Transferrin: 287 mg/dL (ref 212.0–360.0)

## 2023-10-06 LAB — HEPATIC FUNCTION PANEL
ALT: 14 U/L (ref 0–35)
AST: 18 U/L (ref 0–37)
Albumin: 4 g/dL (ref 3.5–5.2)
Alkaline Phosphatase: 53 U/L (ref 39–117)
Bilirubin, Direct: 0.1 mg/dL (ref 0.0–0.3)
Total Bilirubin: 0.4 mg/dL (ref 0.2–1.2)
Total Protein: 6.9 g/dL (ref 6.0–8.3)

## 2023-10-06 LAB — BASIC METABOLIC PANEL
BUN: 12 mg/dL (ref 6–23)
CO2: 29 meq/L (ref 19–32)
Calcium: 9.2 mg/dL (ref 8.4–10.5)
Chloride: 105 meq/L (ref 96–112)
Creatinine, Ser: 0.71 mg/dL (ref 0.40–1.20)
GFR: 112.25 mL/min (ref >60.00)
Glucose, Bld: 68 mg/dL — ABNORMAL LOW (ref 70–99)
Potassium: 3.7 meq/L (ref 3.5–5.1)
Sodium: 140 meq/L (ref 135–145)

## 2023-10-06 LAB — TSH: TSH: 0.77 u[IU]/mL (ref 0.35–5.50)

## 2023-10-06 MED ORDER — FERROUS SULFATE 325 (65 FE) MG PO TABS: 325.0000 mg | ORAL_TABLET | Freq: Every day | ORAL | 1 refills | Status: AC

## 2023-10-06 MED ORDER — FLUOCINONIDE EMULSIFIED BASE 0.05 % EX CREA: 1.0000 | TOPICAL_CREAM | Freq: Two times a day (BID) | CUTANEOUS | 2 refills | Status: AC

## 2023-10-06 MED ORDER — TERBINAFINE HCL 1 % EX CREA: 1.0000 | TOPICAL_CREAM | Freq: Two times a day (BID) | CUTANEOUS | 2 refills | Status: AC

## 2023-10-06 NOTE — Progress Notes (Signed)
 Subjective:  Patient ID: Sally Taylor, female    DOB: 04-Aug-1990  Age: 33 y.o. MRN: [id number]  CC: Rash, Anemia, and Hypertension   HPI Sally Taylor presents for f/up ----  Discussed the use of AI scribe software for clinical note transcription with the patient, who gave verbal consent to proceed.  History of Present Illness   Sally Taylor is a 33 year old female with hypertension who presents with concerns about itchy, dry, and scaly feet.  She has been experiencing severe itching, dryness, and scaliness of her feet, describing them as 'super itchy' and 'super dry, super scaly.' Previously, she took medication once a week for four weeks, which seemed to improve the condition, but the symptoms have recently worsened. She is not currently using any treatment for this condition.  No chest pain or shortness of breath. She is not currently taking her prescribed indapamide as she has run out of the medication.  She is not taking any iron supplements and denies taking any medication for pain, nausea, or vomiting, except for occasional over-the-counter Motrin for back pain or headaches, which she has not used in a few years.  She has a Nexplanon implant and experiences regular menstrual cycles, with the last cycle starting on March 9th and ending around March 16th. Her cycles have not been long or heavy recently. She sometimes feels 'a little sluggish,' which could suggest mild symptoms of iron deficiency, but she denies other symptoms such as shortness of breath or craving ice chips.  She has not received a flu vaccine and does not typically get flu shots. She reports feeling sluggish at times, which could be related to iron deficiency, but she is not taking an iron supplement.       Outpatient Medications Prior to Visit  Medication Sig Dispense Refill   etonogestrel (NEXPLANON) 68 MG IMPL implant 1 each (68 mg total) by  Subdermal route. 1 each 0   Ferric Maltol (ACCRUFER) 30 MG CAPS Take 1 capsule (30 mg total) by mouth in the morning and at bedtime. 180 capsule 1   fluconazole (DIFLUCAN) 150 MG tablet Take 1 tablet (150 mg total) by mouth once a week. 4 tablet 0   fluocinonide-emollient (LIDEX-E) 0.05 % cream Apply 1 Application topically 2 (two) times daily. 60 g 1   indapamide (LOZOL) 1.25 MG tablet TAKE 1 TABLET BY MOUTH DAILY. 90 tablet 0   Naproxen 375 MG TBEC Take 1 tablet (375 mg total) by mouth 2 (two) times daily. 180 tablet 0   ondansetron (ZOFRAN) 4 MG tablet Take 1 tablet (4 mg total) by mouth every 8 (eight) hours as needed. 20 tablet 0   No facility-administered medications prior to visit.    ROS Review of Systems  Constitutional: Negative.  Negative for appetite change, chills, diaphoresis and fatigue.  HENT: Negative.    Eyes: Negative.   Respiratory:  Negative for cough, chest tightness, shortness of breath and wheezing.   Cardiovascular:  Negative for chest pain, palpitations and leg swelling.  Gastrointestinal:  Negative for abdominal pain, blood in stool, constipation, diarrhea, nausea and vomiting.  Endocrine: Negative.   Genitourinary: Negative.  Negative for difficulty urinating.  Musculoskeletal: Negative.  Negative for arthralgias.  Skin:  Positive for rash.  Neurological:  Negative for dizziness and weakness.  Hematological:  Negative for adenopathy. Does not bruise/bleed easily.  Psychiatric/Behavioral: Negative.      Objective:  BP 132/86 (BP Location: Left  Arm, Patient Position: Sitting, Cuff Size: Normal)   Pulse 74   Temp (!) 97.3 F (36.3 C) (Temporal)   Resp 16   Ht 5\' 2"  (1.575 m)   Wt 162 lb (73.5 kg)   LMP 09/26/2023 (Exact Date)   SpO2 98%   BMI 29.63 kg/m   BP Readings from Last 3 Encounters:  10/06/23 132/86  03/10/23 (!) 145/82  02/17/23 132/84    Wt Readings from Last 3 Encounters:  10/06/23 162 lb (73.5 kg)  03/10/23 166 lb 3.2 oz (75.4 kg)   02/17/23 167 lb (75.8 kg)    Physical Exam Vitals reviewed.  Constitutional:      Appearance: Normal appearance.  HENT:     Mouth/Throat:     Mouth: Mucous membranes are moist.  Eyes:     General: No scleral icterus.    Conjunctiva/sclera: Conjunctivae normal.  Cardiovascular:     Rate and Rhythm: Normal rate and regular rhythm.     Heart sounds: No murmur heard. Pulmonary:     Effort: Pulmonary effort is normal.     Breath sounds: No stridor. No wheezing, rhonchi or rales.  Abdominal:     General: Abdomen is flat.     Palpations: There is no mass.     Tenderness: There is no abdominal tenderness. There is no guarding.  Musculoskeletal:     Cervical back: Neck supple.       Legs:  Skin:    General: Skin is warm.     Findings: Rash present. Rash is papular and scaling.  Neurological:     General: No focal deficit present.     Mental Status: She is alert. Mental status is at baseline.  Psychiatric:        Mood and Affect: Mood normal.        Behavior: Behavior normal.     Lab Results  Component Value Date   WBC 8.2 10/06/2023   HGB 12.7 10/06/2023   HCT 37.9 10/06/2023   PLT 406.0 (H) 10/06/2023   GLUCOSE 68 (L) 10/06/2023   ALT 14 10/06/2023   AST 18 10/06/2023   NA 140 10/06/2023   K 3.7 10/06/2023   CL 105 10/06/2023   CREATININE 0.71 10/06/2023   BUN 12 10/06/2023   CO2 29 10/06/2023   TSH 0.77 10/06/2023   HGBA1C 5.2 11/24/2019    No results found.  Assessment & Plan:   Primary hypertension- Her BP is adequately well controlled. -     Basic metabolic panel; Future -     TSH; Future -     Hepatic function panel; Future  Iron deficiency anemia due to chronic blood loss -     IBC + Ferritin; Future -     CBC with Differential/Platelet; Future -     Ferrous Sulfate; Take 1 tablet (325 mg total) by mouth daily with breakfast.  Dispense: 90 tablet; Refill: 1  Intrinsic eczema -     Fluocinonide Emulsified Base; Apply 1 Application topically 2  (two) times daily.  Dispense: 60 g; Refill: 2  Tinea pedis of both feet -     Terbinafine HCl; Apply 1 Application topically 2 (two) times daily.  Dispense: 30 g; Refill: 2  Thrombocytosis -     Ferrous Sulfate; Take 1 tablet (325 mg total) by mouth daily with breakfast.  Dispense: 90 tablet; Refill: 1     Follow-up: Return in about 6 months (around 04/07/2024).  Sanda Linger, MD

## 2023-10-06 NOTE — Patient Instructions (Signed)
Athlete's Foot  Athlete's foot (tinea pedis) is a fungal infection of the skin on your feet. It often occurs on the skin that is between or underneath your toes. It can also occur on the soles of your feet. Symptoms include itchy or white and flaky areas on the skin. The infection can spread from person to person (is contagious). It can also spread when a person's bare feet come in contact with the fungus on shower floors or on items such as shoes. Follow these instructions at home: Medicines Apply or take over-the-counter and prescription medicines only as told by your doctor. Apply your antifungal medicine as told by your doctor. Do not stop using it even if your feet start to get better. Foot care Do not scratch your feet. Keep your feet dry: Wear cotton or wool socks. Change your socks every day or if they become wet. Wear shoes that allow air to move around, such as sandals or canvas tennis shoes. Wash and dry your feet: Every day or as told by your doctor. After exercising. Including the area between your toes. General instructions Do not share any of these items that touch your feet: Towels. Shoes. Nail clippers. Other personal items. Protect your feet by wearing sandals in wet areas, such as locker rooms and shared showers. Keep all follow-up visits. If you have diabetes, keep your blood sugar under control. Contact a doctor if: You have a fever. You have swelling, pain, warmth, or redness in your foot. Your feet are not getting better with treatment. Your symptoms get worse. You have new symptoms. You have very bad pain. Summary Athlete's foot is a fungal infection of the skin on your feet. This condition is caused by a fungus that grows in warm, moist places. Symptoms include itchy or white and flaky areas on the skin. Apply your antifungal medicine as told by your doctor. Keep your feet clean and dry. This information is not intended to replace advice given to you by  your health care provider. Make sure you discuss any questions you have with your health care provider. Document Revised: 10/27/2020 Document Reviewed: 10/27/2020 Elsevier Patient Education  2024 [employer].

## 2023-10-09 ENCOUNTER — Encounter: Payer: Self-pay | Admitting: Internal Medicine

## 2023-10-09 MED ORDER — INDAPAMIDE 1.25 MG PO TABS: 1.2500 mg | ORAL_TABLET | Freq: Every day | ORAL | 0 refills | Status: AC

## 2023-10-12 ENCOUNTER — Encounter: Payer: Self-pay | Admitting: Internal Medicine

## 2024-08-23 ENCOUNTER — Ambulatory Visit: Admitting: Obstetrics and Gynecology

## 2024-08-23 ENCOUNTER — Encounter: Admitting: Internal Medicine

## 2024-09-27 ENCOUNTER — Encounter: Admitting: Internal Medicine
